# Patient Record
Sex: Male | Born: 1965
Health system: Southern US, Community
[De-identification: ages and names within clinical notes are randomized; demographics above are authoritative.]

## PROBLEM LIST (undated history)

## (undated) DIAGNOSIS — G40909 Epilepsy, unspecified, not intractable, without status epilepticus: Secondary | ICD-10-CM

## (undated) DIAGNOSIS — I341 Nonrheumatic mitral (valve) prolapse: Secondary | ICD-10-CM

## (undated) DIAGNOSIS — Z87442 Personal history of urinary calculi: Secondary | ICD-10-CM

## (undated) DIAGNOSIS — H9190 Unspecified hearing loss, unspecified ear: Secondary | ICD-10-CM

## (undated) DIAGNOSIS — J45909 Unspecified asthma, uncomplicated: Secondary | ICD-10-CM

## (undated) DIAGNOSIS — I10 Essential (primary) hypertension: Secondary | ICD-10-CM

## (undated) DIAGNOSIS — T7840XA Allergy, unspecified, initial encounter: Secondary | ICD-10-CM

## (undated) DIAGNOSIS — K635 Polyp of colon: Secondary | ICD-10-CM

## (undated) DIAGNOSIS — E079 Disorder of thyroid, unspecified: Secondary | ICD-10-CM

## (undated) DIAGNOSIS — F419 Anxiety disorder, unspecified: Secondary | ICD-10-CM

## (undated) DIAGNOSIS — E039 Hypothyroidism, unspecified: Secondary | ICD-10-CM

## (undated) DIAGNOSIS — K219 Gastro-esophageal reflux disease without esophagitis: Secondary | ICD-10-CM

## (undated) DIAGNOSIS — R6250 Unspecified lack of expected normal physiological development in childhood: Secondary | ICD-10-CM

## (undated) DIAGNOSIS — M1990 Unspecified osteoarthritis, unspecified site: Secondary | ICD-10-CM

## (undated) DIAGNOSIS — E7800 Pure hypercholesterolemia, unspecified: Secondary | ICD-10-CM

## (undated) DIAGNOSIS — R011 Cardiac murmur, unspecified: Secondary | ICD-10-CM

## (undated) HISTORY — DX: Nonrheumatic mitral (valve) prolapse: I34.1

## (undated) HISTORY — PX: EXTERNAL EAR SURGERY: SHX627

## (undated) HISTORY — PX: OTHER SURGICAL HISTORY: SHX169

## (undated) HISTORY — DX: Epilepsy, unspecified, not intractable, without status epilepticus: G40.909

## (undated) HISTORY — PX: HEMORRHOID BANDING: SHX5850

## (undated) HISTORY — DX: Anxiety disorder, unspecified: F41.9

## (undated) HISTORY — DX: Disorder of thyroid, unspecified: E07.9

## (undated) HISTORY — PX: ORCHIOPEXY: SHX479

## (undated) HISTORY — DX: Cardiac murmur, unspecified: R01.1

## (undated) HISTORY — DX: Allergy, unspecified, initial encounter: T78.40XA

## (undated) HISTORY — DX: Pure hypercholesterolemia, unspecified: E78.00

## (undated) HISTORY — PX: MANDIBLE FRACTURE SURGERY: SHX706

## (undated) HISTORY — DX: Unspecified hearing loss, unspecified ear: H91.90

---

## 2004-05-09 ENCOUNTER — Ambulatory Visit: Payer: Self-pay | Admitting: Family Medicine

## 2004-06-11 ENCOUNTER — Ambulatory Visit: Payer: Self-pay | Admitting: Family Medicine

## 2004-08-14 ENCOUNTER — Ambulatory Visit: Payer: Self-pay | Admitting: Family Medicine

## 2004-09-17 ENCOUNTER — Ambulatory Visit: Payer: Self-pay | Admitting: Family Medicine

## 2005-01-07 ENCOUNTER — Ambulatory Visit: Payer: Self-pay | Admitting: Family Medicine

## 2005-04-10 ENCOUNTER — Ambulatory Visit: Payer: Self-pay | Admitting: Family Medicine

## 2005-05-24 ENCOUNTER — Ambulatory Visit: Payer: Self-pay | Admitting: Family Medicine

## 2005-10-02 ENCOUNTER — Ambulatory Visit: Payer: Self-pay | Admitting: Family Medicine

## 2005-11-06 ENCOUNTER — Ambulatory Visit: Payer: Self-pay | Admitting: Family Medicine

## 2005-11-18 ENCOUNTER — Ambulatory Visit: Payer: Self-pay | Admitting: Family Medicine

## 2005-12-03 ENCOUNTER — Ambulatory Visit: Payer: Self-pay | Admitting: Gastroenterology

## 2005-12-11 ENCOUNTER — Ambulatory Visit (HOSPITAL_COMMUNITY): Admission: RE | Admit: 2005-12-11 | Discharge: 2005-12-11 | Payer: Self-pay | Admitting: Gastroenterology

## 2005-12-26 ENCOUNTER — Ambulatory Visit: Payer: Self-pay | Admitting: Family Medicine

## 2006-07-03 ENCOUNTER — Ambulatory Visit: Payer: Self-pay | Admitting: Family Medicine

## 2006-07-03 LAB — CONVERTED CEMR LAB: TSH: 4.58 microintl units/mL (ref 0.35–5.50)

## 2006-08-11 ENCOUNTER — Ambulatory Visit: Payer: Self-pay | Admitting: Family Medicine

## 2006-09-08 ENCOUNTER — Ambulatory Visit: Payer: Self-pay | Admitting: Family Medicine

## 2006-09-08 LAB — CONVERTED CEMR LAB: Phenytoin Lvl: 10 ug/mL (ref 10.0–20.0)

## 2006-10-29 DIAGNOSIS — G40909 Epilepsy, unspecified, not intractable, without status epilepticus: Secondary | ICD-10-CM | POA: Insufficient documentation

## 2006-10-29 DIAGNOSIS — G40109 Localization-related (focal) (partial) symptomatic epilepsy and epileptic syndromes with simple partial seizures, not intractable, without status epilepticus: Secondary | ICD-10-CM | POA: Insufficient documentation

## 2006-12-02 DIAGNOSIS — R1012 Left upper quadrant pain: Secondary | ICD-10-CM

## 2006-12-03 ENCOUNTER — Ambulatory Visit: Payer: Self-pay | Admitting: Family Medicine

## 2006-12-04 ENCOUNTER — Ambulatory Visit: Payer: Self-pay | Admitting: Family Medicine

## 2007-07-09 ENCOUNTER — Ambulatory Visit: Payer: Self-pay | Admitting: Family Medicine

## 2007-07-09 DIAGNOSIS — E039 Hypothyroidism, unspecified: Secondary | ICD-10-CM | POA: Insufficient documentation

## 2007-07-09 LAB — CONVERTED CEMR LAB
ALT: 21 units/L (ref 0–53)
AST: 24 units/L (ref 0–37)
Alkaline Phosphatase: 85 units/L (ref 39–117)
Basophils Absolute: 0 10*3/uL (ref 0.0–0.1)
Basophils Relative: 0.5 % (ref 0.0–1.0)
Bilirubin, Direct: 0.1 mg/dL (ref 0.0–0.3)
Blood in Urine, dipstick: NEGATIVE
CO2: 32 meq/L (ref 19–32)
Calcium: 9 mg/dL (ref 8.4–10.5)
Chloride: 100 meq/L (ref 96–112)
Glucose, Bld: 82 mg/dL (ref 70–99)
Glucose, Urine, Semiquant: NEGATIVE
Hemoglobin: 15 g/dL (ref 13.0–17.0)
Ketones, urine, test strip: NEGATIVE
Lymphocytes Relative: 20.7 % (ref 12.0–46.0)
MCHC: 34.3 g/dL (ref 30.0–36.0)
Monocytes Relative: 6.5 % (ref 3.0–12.0)
Neutro Abs: 4.6 10*3/uL (ref 1.4–7.7)
Neutrophils Relative %: 71.5 % (ref 43.0–77.0)
RBC: 4.82 M/uL (ref 4.22–5.81)
RDW: 12.3 % (ref 11.5–14.6)
Sodium: 140 meq/L (ref 135–145)
Specific Gravity, Urine: 1.01
Total Bilirubin: 0.5 mg/dL (ref 0.3–1.2)
Total Protein: 7 g/dL (ref 6.0–8.3)
pH: 5.5

## 2008-08-18 ENCOUNTER — Ambulatory Visit: Payer: Self-pay | Admitting: Family Medicine

## 2008-08-22 LAB — CONVERTED CEMR LAB
BUN: 9 mg/dL (ref 6–23)
Basophils Absolute: 0 10*3/uL (ref 0.0–0.1)
Basophils Relative: 0.6 % (ref 0.0–3.0)
Bilirubin, Direct: 0 mg/dL (ref 0.0–0.3)
CO2: 31 meq/L (ref 19–32)
Calcium: 8.4 mg/dL (ref 8.4–10.5)
Chloride: 103 meq/L (ref 96–112)
Creatinine, Ser: 0.9 mg/dL (ref 0.4–1.5)
Eosinophils Absolute: 0 10*3/uL (ref 0.0–0.7)
Glucose, Bld: 76 mg/dL (ref 70–99)
Lymphocytes Relative: 22.5 % (ref 12.0–46.0)
MCHC: 35.3 g/dL (ref 30.0–36.0)
MCV: 88.9 fL (ref 78.0–100.0)
Monocytes Absolute: 0.4 10*3/uL (ref 0.1–1.0)
Neutrophils Relative %: 69.4 % (ref 43.0–77.0)
Phenytoin Lvl: 14.3 ug/mL (ref 10.0–20.0)
Platelets: 166 10*3/uL (ref 150.0–400.0)
RBC: 4.76 M/uL (ref 4.22–5.81)
RDW: 12.3 % (ref 11.5–14.6)
Total Protein: 7 g/dL (ref 6.0–8.3)

## 2009-03-06 ENCOUNTER — Ambulatory Visit: Payer: Self-pay | Admitting: Family Medicine

## 2009-07-14 ENCOUNTER — Ambulatory Visit: Payer: Self-pay | Admitting: Family Medicine

## 2009-07-17 LAB — CONVERTED CEMR LAB
ALT: 22 units/L (ref 0–53)
AST: 25 units/L (ref 0–37)
Albumin: 4.2 g/dL (ref 3.5–5.2)
Alkaline Phosphatase: 75 units/L (ref 39–117)
Basophils Relative: 0.7 % (ref 0.0–3.0)
CO2: 34 meq/L — ABNORMAL HIGH (ref 19–32)
Calcium: 9.5 mg/dL (ref 8.4–10.5)
GFR calc non Af Amer: 94.68 mL/min (ref >60)
HCT: 46.4 % (ref 39.0–52.0)
Hemoglobin: 15.9 g/dL (ref 13.0–17.0)
Lymphocytes Relative: 18.6 % (ref 12.0–46.0)
MCHC: 34.2 g/dL (ref 30.0–36.0)
Monocytes Relative: 7 % (ref 3.0–12.0)
Neutro Abs: 4.6 10*3/uL (ref 1.4–7.7)
Potassium: 4.9 meq/L (ref 3.5–5.1)
RBC: 5.08 M/uL (ref 4.22–5.81)
Sodium: 145 meq/L (ref 135–145)
Total Protein: 7.2 g/dL (ref 6.0–8.3)

## 2009-10-17 ENCOUNTER — Ambulatory Visit: Payer: Self-pay | Admitting: Family Medicine

## 2009-10-23 ENCOUNTER — Ambulatory Visit: Payer: Self-pay | Admitting: Family Medicine

## 2009-10-23 DIAGNOSIS — R5383 Other fatigue: Secondary | ICD-10-CM

## 2009-10-23 DIAGNOSIS — R519 Headache, unspecified: Secondary | ICD-10-CM | POA: Insufficient documentation

## 2009-10-23 DIAGNOSIS — R51 Headache: Secondary | ICD-10-CM

## 2009-10-23 DIAGNOSIS — R5381 Other malaise: Secondary | ICD-10-CM

## 2009-10-23 LAB — CONVERTED CEMR LAB
AST: 61 units/L — ABNORMAL HIGH (ref 0–37)
BUN: 11 mg/dL (ref 6–23)
Basophils Absolute: 0 10*3/uL (ref 0.0–0.1)
Calcium: 9 mg/dL (ref 8.4–10.5)
Creatinine, Ser: 0.9 mg/dL (ref 0.4–1.5)
GFR calc non Af Amer: 103.6 mL/min (ref >60)
Glucose, Bld: 68 mg/dL — ABNORMAL LOW (ref 70–99)
HCT: 43.9 % (ref 39.0–52.0)
Lymphocytes Relative: 13.3 % (ref 12.0–46.0)
Lymphs Abs: 1.4 10*3/uL (ref 0.7–4.0)
Monocytes Relative: 6.6 % (ref 3.0–12.0)
Neutrophils Relative %: 79.6 % — ABNORMAL HIGH (ref 43.0–77.0)
Platelets: 246 10*3/uL (ref 150.0–400.0)
RDW: 13 % (ref 11.5–14.6)
TSH: 7.01 microintl units/mL — ABNORMAL HIGH (ref 0.35–5.50)
Total Bilirubin: 0.3 mg/dL (ref 0.3–1.2)

## 2009-10-24 ENCOUNTER — Encounter: Payer: Self-pay | Admitting: Family Medicine

## 2009-10-31 ENCOUNTER — Ambulatory Visit: Payer: Self-pay | Admitting: Family Medicine

## 2009-11-27 ENCOUNTER — Telehealth: Payer: Self-pay | Admitting: Family Medicine

## 2009-11-27 ENCOUNTER — Ambulatory Visit: Payer: Self-pay | Admitting: Family Medicine
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
## 2010-04-05 NOTE — Assessment & Plan Note (Signed)
Summary: FLU SHOT/CJR  Nurse Visit  Flu Vaccine Consent Questions     Do you have a history of severe allergic reactions to this vaccine? no    Any prior history of allergic reactions to egg and/or gelatin? no    Do you have a sensitivity to the preservative Thimersol? no    Do you have a past history of Guillan-Barre Syndrome? no    Do you currently have an acute febrile illness? no    Have you ever had a severe reaction to latex? no    Vaccine information given and explained to patient? yes    Are you currently pregnant? no    Lot Number:[id number]   Exp Date:09/01/2010   Site Given  Left Deltoid IM   Allergies: No Known Drug Allergies  Orders Added: 1)  Flu Vaccine 30yrs + [90658] 2)  Administration Flu vaccine - MCR [G0008]
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
## 2010-04-05 NOTE — Miscellaneous (Signed)
Summary: rx update  Clinical Lists Changes  Medications: Removed medication of SYNTHROID 50 MCG  TABS (LEVOTHYROXINE SODIUM) Take 1 tablet by mouth once a day Added new medication of SYNTHROID 75 MCG TABS (LEVOTHYROXINE SODIUM) take one tab by mouth once daily

## 2010-04-05 NOTE — Progress Notes (Signed)
Summary: synthroid refill  Phone Note Refill Request   Refills Requested: Medication #1:  SYNTHROID 75 MCG TABS take one tab by mouth once daily. Initial call taken by: Kern Reap CMA Duncan Dull),  November 27, 2009 4:45 PM    Prescriptions: SYNTHROID 75 MCG TABS (LEVOTHYROXINE SODIUM) take one tab by mouth once daily  #90 x 3   Entered by:   Kern Reap CMA (AAMA)   Authorized by:   Roderick Pee MD   Signed by:   Kern Reap CMA (AAMA) on 11/27/2009   Method used:   Electronically to        [facility]  [address] [facility]* (retail)       [address]       [place], [place]  [address]       Ph: [phone] or [phone]       Fax: [phone]   RxID:   [id number]

## 2010-07-20 NOTE — Letter (Signed)
December 03, 2005    [facility]  [address]  [place], [place]  [address]   RE:  Damon, [name]  MRN:  [id number]  /  DOB:  09/16/1965   Dear Mr. Wenzl:   It is my pleasure to have treated you recently as a new patient in my  office. I appreciate your confidence and the opportunity to participate in  your care.   Since I do have a busy inpatient endoscopy schedule and office schedule, my  office hours vary weekly. I am, however, available for emergency calls every  day through my office. If I cannot promptly meet an urgent office  appointment, another one of our gastroenterologists will be able to assist  you.   My well-trained staff are prepared to help you at all times. For emergencies  after office hours, a physician from our gastroenterology section is always  available through my 24-hour answering service.   While you are under my care, I encourage discussion of your questions and  concerns, and I will be happy to return your calls as soon as I am  available.   Once again, I welcome you as a new patient and I look forward to a happy and  healthy relationship.   Sincerely,     Barbette Hair. Arlyce Dice, MD,FACG   RDK/MedQ  DD:  12/03/2005 DT:  12/04/2005 Job #:  [id number]
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
## 2010-08-29 ENCOUNTER — Encounter: Payer: Self-pay | Admitting: Family Medicine

## 2010-08-30 ENCOUNTER — Encounter: Payer: Self-pay | Admitting: Family Medicine

## 2010-08-30 ENCOUNTER — Ambulatory Visit (INDEPENDENT_AMBULATORY_CARE_PROVIDER_SITE_OTHER): Payer: Medicare Other | Admitting: Family Medicine

## 2010-08-30 DIAGNOSIS — E039 Hypothyroidism, unspecified: Secondary | ICD-10-CM

## 2010-08-30 DIAGNOSIS — R569 Unspecified convulsions: Secondary | ICD-10-CM

## 2010-08-30 LAB — CBC WITH DIFFERENTIAL/PLATELET
Basophils Relative: 0.5 % (ref 0.0–3.0)
Eosinophils Relative: 0.8 % (ref 0.0–5.0)
HCT: 44.6 % (ref 39.0–52.0)
Lymphs Abs: 1.3 10*3/uL (ref 0.7–4.0)
MCV: 90.4 fl (ref 78.0–100.0)
Monocytes Absolute: 0.4 10*3/uL (ref 0.1–1.0)
Neutro Abs: 4.4 10*3/uL (ref 1.4–7.7)
Platelets: 188 10*3/uL (ref 150.0–400.0)
RBC: 4.93 Mil/uL (ref 4.22–5.81)
WBC: 6.1 10*3/uL (ref 4.5–10.5)

## 2010-08-30 LAB — HEPATIC FUNCTION PANEL
ALT: 17 U/L (ref 0–53)
Albumin: 4.3 g/dL (ref 3.5–5.2)
Total Protein: 7 g/dL (ref 6.0–8.3)

## 2010-08-30 LAB — POCT URINALYSIS DIPSTICK
Bilirubin, UA: NEGATIVE
Blood, UA: NEGATIVE
Glucose, UA: NEGATIVE
Nitrite, UA: NEGATIVE
Spec Grav, UA: 1.02

## 2010-08-30 LAB — BASIC METABOLIC PANEL
BUN: 9 mg/dL (ref 6–23)
Chloride: 101 mEq/L (ref 96–112)
Potassium: 3.9 mEq/L (ref 3.5–5.1)

## 2010-08-30 LAB — LIPID PANEL
Cholesterol: 200 mg/dL (ref 0–200)
Triglycerides: 112 mg/dL (ref 0.0–149.0)

## 2010-08-30 LAB — TSH: TSH: 2.83 u[IU]/mL (ref 0.35–5.50)

## 2010-08-30 MED ORDER — LEVOTHYROXINE SODIUM 75 MCG PO TABS: 75.0000 ug | ORAL_TABLET | Freq: Every day | ORAL | Status: DC

## 2010-08-30 MED ORDER — PHENYTOIN SODIUM EXTENDED 100 MG PO CAPS: 100.0000 mg | ORAL_CAPSULE | Freq: Two times a day (BID) | ORAL | Status: DC

## 2010-08-30 NOTE — Patient Instructions (Signed)
Continue current medications return in one year, sooner if any problem
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
## 2010-08-31 LAB — PHENYTOIN LEVEL, TOTAL: Phenytoin Lvl: 19.4 ug/mL (ref 10.0–20.0)

## 2010-09-06 NOTE — Progress Notes (Signed)
lmom for patient

## 2010-11-06 ENCOUNTER — Telehealth: Payer: Self-pay | Admitting: Family Medicine

## 2010-11-06 MED ORDER — PHENYTOIN SODIUM EXTENDED 100 MG PO CAPS: ORAL_CAPSULE | ORAL | Status: DC

## 2010-11-06 NOTE — Telephone Encounter (Signed)
Patient takes Dilantin 100 mg 2 tabs bid.  New rx sent.

## 2010-11-06 NOTE — Telephone Encounter (Signed)
Pt Mother contacted regarding her sons medication of Dilantin. Normally his prescription is for 1 pill 2 times daily last time prescription was fill it was for 1 pill 1 time daily.. Please contact

## 2011-08-27 ENCOUNTER — Other Ambulatory Visit: Payer: Self-pay | Admitting: Family Medicine

## 2011-11-11 ENCOUNTER — Ambulatory Visit (INDEPENDENT_AMBULATORY_CARE_PROVIDER_SITE_OTHER): Payer: Medicare Other | Admitting: Family Medicine

## 2011-11-11 ENCOUNTER — Encounter: Payer: Self-pay | Admitting: Family Medicine

## 2011-11-11 VITALS — BP 110/80 | Temp 98.7°F | Wt 140.0 lb

## 2011-11-11 DIAGNOSIS — R569 Unspecified convulsions: Secondary | ICD-10-CM

## 2011-11-11 DIAGNOSIS — Z23 Encounter for immunization: Secondary | ICD-10-CM

## 2011-11-11 DIAGNOSIS — Z79899 Other long term (current) drug therapy: Secondary | ICD-10-CM

## 2011-11-11 DIAGNOSIS — E039 Hypothyroidism, unspecified: Secondary | ICD-10-CM

## 2011-11-11 LAB — TSH: TSH: 1.18 u[IU]/mL (ref 0.35–5.50)

## 2011-11-11 LAB — POCT URINALYSIS DIPSTICK
Bilirubin, UA: NEGATIVE
Glucose, UA: NEGATIVE
Ketones, UA: NEGATIVE
Leukocytes, UA: NEGATIVE
Nitrite, UA: NEGATIVE
pH, UA: 5.5

## 2011-11-11 LAB — BASIC METABOLIC PANEL
BUN: 11 mg/dL (ref 6–23)
Chloride: 100 mEq/L (ref 96–112)
GFR: 97.36 mL/min (ref >60.00)
Potassium: 4 mEq/L (ref 3.5–5.1)
Sodium: 137 mEq/L (ref 135–145)

## 2011-11-11 LAB — CBC WITH DIFFERENTIAL/PLATELET
Eosinophils Relative: 1.2 % (ref 0.0–5.0)
HCT: 44.3 % (ref 39.0–52.0)
Hemoglobin: 14.7 g/dL (ref 13.0–17.0)
Lymphocytes Relative: 18.9 % (ref 12.0–46.0)
Lymphs Abs: 1.3 10*3/uL (ref 0.7–4.0)
Monocytes Relative: 6.8 % (ref 3.0–12.0)
Platelets: 189 10*3/uL (ref 150.0–400.0)
WBC: 6.9 10*3/uL (ref 4.5–10.5)

## 2011-11-11 MED ORDER — LEVOTHYROXINE SODIUM 75 MCG PO TABS: 75.0000 ug | ORAL_TABLET | Freq: Every day | ORAL | Status: DC

## 2011-11-11 MED ORDER — PHENYTOIN SODIUM EXTENDED 100 MG PO CAPS: ORAL_CAPSULE | ORAL | Status: DC

## 2011-11-11 NOTE — Patient Instructions (Signed)
Continue your current medications  We will call you within 2 weeks with your lab reports
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
## 2011-11-12 LAB — PHENYTOIN LEVEL, TOTAL: Phenytoin Lvl: 15.3 ug/mL (ref 10.0–20.0)

## 2012-03-12 ENCOUNTER — Ambulatory Visit (INDEPENDENT_AMBULATORY_CARE_PROVIDER_SITE_OTHER): Payer: Medicare Other | Admitting: Family Medicine

## 2012-03-12 ENCOUNTER — Encounter: Payer: Self-pay | Admitting: Family Medicine

## 2012-03-12 VITALS — BP 120/80 | Temp 97.5°F | Wt 139.0 lb

## 2012-03-12 DIAGNOSIS — J069 Acute upper respiratory infection, unspecified: Secondary | ICD-10-CM | POA: Insufficient documentation

## 2012-03-12 MED ORDER — HYDROCODONE-HOMATROPINE 5-1.5 MG/5ML PO SYRP: ORAL_SOLUTION | ORAL | Status: DC

## 2012-03-12 NOTE — Progress Notes (Signed)
  Subjective:    Patient ID: Damon [name], male    DOB: 1966/01/22, 47 y.o.   MRN: [id number]  HPI  Mcarthur is a 47 year old male who comes in today accompanied by his mother for evaluation of a cough for 5 days  She's concerned he may have pneumonia. He has no fever chills. He slept well last night. She also has similar symptoms. He is a nonsmoker  Review of Systems    general and pulmonary review of systems otherwise negative Objective:   Physical Exam Well-developed well-nourished male in no acute distress HEENT negative neck was supple no adenopathy lungs are clear       Assessment & Plan:

## 2012-11-24 ENCOUNTER — Ambulatory Visit (INDEPENDENT_AMBULATORY_CARE_PROVIDER_SITE_OTHER): Payer: Medicare Other

## 2012-11-24 DIAGNOSIS — Z23 Encounter for immunization: Secondary | ICD-10-CM

## 2013-01-04 ENCOUNTER — Encounter: Payer: Self-pay | Admitting: Family Medicine

## 2013-01-04 ENCOUNTER — Ambulatory Visit (INDEPENDENT_AMBULATORY_CARE_PROVIDER_SITE_OTHER): Payer: Medicare Other | Admitting: Family Medicine

## 2013-01-04 VITALS — BP 120/80 | Temp 97.4°F | Wt 144.0 lb

## 2013-01-04 DIAGNOSIS — B351 Tinea unguium: Secondary | ICD-10-CM | POA: Insufficient documentation

## 2013-01-04 DIAGNOSIS — E039 Hypothyroidism, unspecified: Secondary | ICD-10-CM

## 2013-01-04 DIAGNOSIS — R569 Unspecified convulsions: Secondary | ICD-10-CM

## 2013-01-04 LAB — BASIC METABOLIC PANEL
BUN: 12 mg/dL (ref 6–23)
Chloride: 103 mEq/L (ref 96–112)
GFR: 72.81 mL/min (ref >60.00)
Glucose, Bld: 66 mg/dL — ABNORMAL LOW (ref 70–99)
Potassium: 4 mEq/L (ref 3.5–5.1)
Sodium: 140 mEq/L (ref 135–145)

## 2013-01-04 LAB — POCT URINALYSIS DIPSTICK
Bilirubin, UA: NEGATIVE
Blood, UA: NEGATIVE
Glucose, UA: NEGATIVE
Ketones, UA: NEGATIVE
Nitrite, UA: NEGATIVE
pH, UA: 5.5

## 2013-01-04 LAB — CBC WITH DIFFERENTIAL/PLATELET
Basophils Absolute: 0 10*3/uL (ref 0.0–0.1)
Basophils Relative: 0.5 % (ref 0.0–3.0)
Eosinophils Relative: 1.6 % (ref 0.0–5.0)
HCT: 43.7 % (ref 39.0–52.0)
Hemoglobin: 14.7 g/dL (ref 13.0–17.0)
Lymphocytes Relative: 21 % (ref 12.0–46.0)
Lymphs Abs: 1.5 10*3/uL (ref 0.7–4.0)
Monocytes Relative: 6 % (ref 3.0–12.0)
Neutro Abs: 5 10*3/uL (ref 1.4–7.7)
RBC: 4.92 Mil/uL (ref 4.22–5.81)
WBC: 7 10*3/uL (ref 4.5–10.5)

## 2013-01-04 MED ORDER — LEVOTHYROXINE SODIUM 75 MCG PO TABS: 75.0000 ug | ORAL_TABLET | Freq: Every day | ORAL | Status: DC

## 2013-01-04 MED ORDER — PHENYTOIN SODIUM EXTENDED 100 MG PO CAPS: ORAL_CAPSULE | ORAL | Status: DC
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
## 2013-01-04 NOTE — Patient Instructions (Signed)
Soak and file his toenails weekly  We will call you and get his lab work back  Continue his current medication for now

## 2013-01-05 LAB — PHENYTOIN LEVEL, TOTAL: Phenytoin Lvl: 14.4 ug/mL (ref 10.0–20.0)

## 2013-10-12 ENCOUNTER — Encounter: Payer: Self-pay | Admitting: Family Medicine

## 2013-10-12 ENCOUNTER — Ambulatory Visit (INDEPENDENT_AMBULATORY_CARE_PROVIDER_SITE_OTHER): Payer: Medicare Other | Admitting: Family Medicine

## 2013-10-12 DIAGNOSIS — E039 Hypothyroidism, unspecified: Secondary | ICD-10-CM

## 2013-10-12 DIAGNOSIS — R569 Unspecified convulsions: Secondary | ICD-10-CM

## 2013-10-12 LAB — BASIC METABOLIC PANEL
BUN: 10 mg/dL (ref 6–23)
CALCIUM: 8.9 mg/dL (ref 8.4–10.5)
CO2: 27 mEq/L (ref 19–32)
Chloride: 103 mEq/L (ref 96–112)
Creatinine, Ser: 1 mg/dL (ref 0.4–1.5)
GFR: 84.42 mL/min (ref >60.00)
Glucose, Bld: 61 mg/dL — ABNORMAL LOW (ref 70–99)
Potassium: 4.7 mEq/L (ref 3.5–5.1)
SODIUM: 139 meq/L (ref 135–145)

## 2013-10-12 LAB — CBC WITH DIFFERENTIAL/PLATELET
BASOS ABS: 0 10*3/uL (ref 0.0–0.1)
Basophils Relative: 0.4 % (ref 0.0–3.0)
EOS ABS: 0 10*3/uL (ref 0.0–0.7)
EOS PCT: 0.4 % (ref 0.0–5.0)
HCT: 44 % (ref 39.0–52.0)
Hemoglobin: 14.7 g/dL (ref 13.0–17.0)
Lymphocytes Relative: 18.2 % (ref 12.0–46.0)
Lymphs Abs: 1.1 10*3/uL (ref 0.7–4.0)
MCHC: 33.5 g/dL (ref 30.0–36.0)
MCV: 89.4 fl (ref 78.0–100.0)
MONO ABS: 0.4 10*3/uL (ref 0.1–1.0)
Monocytes Relative: 6.4 % (ref 3.0–12.0)
NEUTROS PCT: 74.6 % (ref 43.0–77.0)
Neutro Abs: 4.4 10*3/uL (ref 1.4–7.7)
PLATELETS: 196 10*3/uL (ref 150.0–400.0)
RBC: 4.92 Mil/uL (ref 4.22–5.81)
RDW: 13.1 % (ref 11.5–15.5)
WBC: 5.9 10*3/uL (ref 4.0–10.5)

## 2013-10-12 LAB — TSH: TSH: 1.39 u[IU]/mL (ref 0.35–4.50)

## 2013-10-12 MED ORDER — PHENYTOIN SODIUM EXTENDED 100 MG PO CAPS: ORAL_CAPSULE | ORAL | Status: DC

## 2013-10-12 MED ORDER — LEVOTHYROXINE SODIUM 75 MCG PO TABS: 75.0000 ug | ORAL_TABLET | Freq: Every day | ORAL | Status: DC
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
## 2013-10-12 NOTE — Patient Instructions (Signed)
Continue current medications  Labs today  Motrin 400 mg twice a day with food elevation ice for soreness in his left wrist  Okay to give him sugar when necessary

## 2013-10-13 LAB — PHENYTOIN LEVEL, TOTAL: PHENYTOIN LVL: 16.8 ug/mL (ref 10.0–20.0)

## 2013-11-12 ENCOUNTER — Ambulatory Visit (INDEPENDENT_AMBULATORY_CARE_PROVIDER_SITE_OTHER): Payer: Medicare Other

## 2013-11-12 DIAGNOSIS — Z23 Encounter for immunization: Secondary | ICD-10-CM

## 2014-03-23 ENCOUNTER — Encounter: Payer: Self-pay | Admitting: *Deleted

## 2014-08-01 ENCOUNTER — Ambulatory Visit (INDEPENDENT_AMBULATORY_CARE_PROVIDER_SITE_OTHER): Payer: Medicare Other | Admitting: Physician Assistant

## 2014-08-01 ENCOUNTER — Ambulatory Visit (INDEPENDENT_AMBULATORY_CARE_PROVIDER_SITE_OTHER): Payer: Medicare Other

## 2014-08-01 VITALS — BP 115/82 | HR 88 | Temp 98.2°F | Resp 17 | Ht 67.0 in | Wt 141.4 lb

## 2014-08-01 DIAGNOSIS — R05 Cough: Secondary | ICD-10-CM | POA: Diagnosis not present

## 2014-08-01 DIAGNOSIS — R058 Other specified cough: Secondary | ICD-10-CM

## 2014-08-01 LAB — POCT CBC
Granulocyte percent: 77.7 %G (ref 37–80)
HCT, POC: 46.3 % (ref 43.5–53.7)
Hemoglobin: 15.4 g/dL (ref 14.1–18.1)
Lymph, poc: 1.6 (ref 0.6–3.4)
MCH, POC: 29.5 pg (ref 27–31.2)
MCHC: 33.3 g/dL (ref 31.8–35.4)
MCV: 88.6 fL (ref 80–97)
MID (cbc): 1 — AB (ref 0–0.9)
MPV: 8.7 fL (ref 0–99.8)
POC GRANULOCYTE: 9.1 — AB (ref 2–6.9)
POC LYMPH %: 13.7 % (ref 10–50)
POC MID %: 8.6 % (ref 0–12)
Platelet Count, POC: 187 10*3/uL (ref 142–424)
RBC: 5.22 M/uL (ref 4.69–6.13)
RDW, POC: 13.2 %
WBC: 11.7 10*3/uL — AB (ref 4.6–10.2)

## 2014-08-01 MED ORDER — GUAIFENESIN ER 1200 MG PO TB12: 1.0000 | ORAL_TABLET | Freq: Two times a day (BID) | ORAL | Status: DC | PRN

## 2014-08-01 MED ORDER — AZITHROMYCIN 250 MG PO TABS: ORAL_TABLET | ORAL | Status: DC

## 2014-08-01 MED ORDER — BENZONATATE 100 MG PO CAPS: 100.0000 mg | ORAL_CAPSULE | Freq: Three times a day (TID) | ORAL | Status: DC | PRN

## 2014-08-01 NOTE — Patient Instructions (Signed)
Please drink plenty of water 64oz (4 regular sized water bottles). You can use delsym (non-drowsy) over the counter to help with cough as well.    Pneumonia Pneumonia is an infection of the lungs.  CAUSES Pneumonia may be caused by bacteria or a virus. Usually, these infections are caused by breathing infectious particles into the lungs (respiratory tract). SIGNS AND SYMPTOMS   Cough.  Fever.  Chest pain.  Increased rate of breathing.  Wheezing.  Mucus production. DIAGNOSIS  If you have the common symptoms of pneumonia, your health care provider will typically confirm the diagnosis with a chest X-ray. The X-ray will show an abnormality in the lung (pulmonary infiltrate) if you have pneumonia. Other tests of your blood, urine, or sputum may be done to find the specific cause of your pneumonia. Your health care provider may also do tests (blood gases or pulse oximetry) to see how well your lungs are working. TREATMENT  Some forms of pneumonia may be spread to other people when you cough or sneeze. You may be asked to wear a mask before and during your exam. Pneumonia that is caused by bacteria is treated with antibiotic medicine. Pneumonia that is caused by the influenza virus may be treated with an antiviral medicine. Most other viral infections must run their course. These infections will not respond to antibiotics.  HOME CARE INSTRUCTIONS   Cough suppressants may be used if you are losing too much rest. However, coughing protects you by clearing your lungs. You should avoid using cough suppressants if you can.  Your health care provider may have prescribed medicine if he or she thinks your pneumonia is caused by bacteria or influenza. Finish your medicine even if you start to feel better.  Your health care provider may also prescribe an expectorant. This loosens the mucus to be coughed up.  Take medicines only as directed by your health care provider.  Do not smoke. Smoking is a  common cause of bronchitis and can contribute to pneumonia. If you are a smoker and continue to smoke, your cough may last several weeks after your pneumonia has cleared.  A cold steam vaporizer or humidifier in your room or home may help loosen mucus.  Coughing is often worse at night. Sleeping in a semi-upright position in a recliner or using a couple pillows under your head will help with this.  Get rest as you feel it is needed. Your body will usually let you know when you need to rest. PREVENTION A pneumococcal shot (vaccine) is available to prevent a common bacterial cause of pneumonia. This is usually suggested for:  People over 70 years old.  Patients on chemotherapy.  People with chronic lung problems, such as bronchitis or emphysema.  People with immune system problems. If you are over 65 or have a high risk condition, you may receive the pneumococcal vaccine if you have not received it before. In some countries, a routine influenza vaccine is also recommended. This vaccine can help prevent some cases of pneumonia.You may be offered the influenza vaccine as part of your care. If you smoke, it is time to quit. You may receive instructions on how to stop smoking. Your health care provider can provide medicines and counseling to help you quit. SEEK MEDICAL CARE IF: You have a fever. SEEK IMMEDIATE MEDICAL CARE IF:   Your illness becomes worse. This is especially true if you are elderly or weakened from any other disease.  You cannot control your cough with suppressants  and are losing sleep.  You begin coughing up blood.  You develop pain which is getting worse or is uncontrolled with medicines.  Any of the symptoms which initially brought you in for treatment are getting worse rather than better.  You develop shortness of breath or chest pain. MAKE SURE YOU:   Understand these instructions.  Will watch your condition.  Will get help right away if you are not doing well  or get worse. Document Released: 02/18/2005 Document Revised: 07/05/2013 Document Reviewed: 05/10/2010 [facility] Patient Information 2015 [place], [place]. This information is not intended to replace advice given to you by your health care provider. Make sure you discuss any questions you have with your health care provider.
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
## 2014-09-07 ENCOUNTER — Ambulatory Visit (INDEPENDENT_AMBULATORY_CARE_PROVIDER_SITE_OTHER): Payer: Medicare Other | Admitting: Physician Assistant

## 2014-09-07 VITALS — BP 122/68 | HR 77 | Temp 97.9°F | Resp 18 | Ht 67.5 in | Wt 144.0 lb

## 2014-09-07 DIAGNOSIS — B880 Other acariasis: Secondary | ICD-10-CM | POA: Diagnosis not present

## 2014-09-07 DIAGNOSIS — L03116 Cellulitis of left lower limb: Secondary | ICD-10-CM | POA: Diagnosis not present

## 2014-09-07 MED ORDER — TRIAMCINOLONE ACETONIDE 0.1 % EX CREA: 1.0000 | TOPICAL_CREAM | Freq: Two times a day (BID) | CUTANEOUS | Status: DC

## 2014-09-07 MED ORDER — DOXYCYCLINE HYCLATE 100 MG PO CAPS: 100.0000 mg | ORAL_CAPSULE | Freq: Two times a day (BID) | ORAL | Status: DC

## 2014-09-07 NOTE — Patient Instructions (Signed)
You likely have chigger bites with one becoming infected.  Please take the antibiotic twice daily for 10 days.  Please apply the steroid topical twice daily for itching. You can continue to apply calamine lotion for itching.  You can take benadryl at night as needed for itching.  Be sure to wear long pants outdoors to try to prevent the bites.

## 2014-09-07 NOTE — Progress Notes (Signed)
   Subjective:    Patient ID: Damon [name]., male    DOB: [birth year], 49 y.o.   MRN: [id number]  Chief Complaint  Patient presents with  . Insect Bite    possible tick bite. several bites one both leg. one on leg inside leg is the worse one    Patient Active Problem List   Diagnosis Date Noted  . Onychomycosis of toenail 01/04/2013  . FATIGUE 10/23/2009  . HEADACHE 10/23/2009  . UNSPECIFIED HYPOTHYROIDISM 07/09/2007  . SEIZURE DISORDER 10/29/2006   Medications, allergies, past medical history, surgical history, family history, social history and problem list reviewed and updated.  HPI  33 yom presents with concern for several bites to legs. [place] a lot. Has had several bites to legs past 10 days. Has not seen any ticks, spiders. Denies seeing anything in bed. He only has marks on his legs none on arms. Has been working outdoors with shorts. His mother who is with him states she has been in the yard as well and has similar marks.   Areas are itchy. Denies fevers, chills.   Review of Systems See HPI.     Objective:   Physical Exam  Constitutional: He appears well-developed and well-nourished.  Non-toxic appearance. He does not have a sickly appearance. He does not appear ill. No distress.  BP 122/68 mmHg  Pulse 77  Temp(Src) 97.9 F (36.6 C) (Oral)  Resp 18  Ht 5' 7.5" (1.715 m)  Wt 144 lb (65.318 kg)  BMI 22.21 kg/m2  SpO2 97%   Skin:  Numerous small macules/papules on lower extremities. All are itchy. No involvement of upper extremities. No lesions between toes. No specific tracking marks. One lesion on left medial calf is approx 7-8 mm circular lesion with surrounding erythema. Proteinaceous material       Assessment & Plan:   Chigger bites - Plan: triamcinolone cream (KENALOG) 0.1 %  Cellulitis of leg, left - Plan: doxycycline (VIBRAMYCIN) 100 MG capsule --suspect chigger bites as cause --lesion on left  medial calf appears infected --> doxy which will also cover for possible tick exposures --triamcinolone bid, calamine, benadryl qhs for itching --long pants outdoors in future  [name], PA-C Physician Assistant-Certified [facility]  09/07/2014 4:37 PM

## 2014-11-07 ENCOUNTER — Other Ambulatory Visit: Payer: Self-pay | Admitting: Family Medicine

## 2014-12-07 ENCOUNTER — Encounter: Payer: Self-pay | Admitting: Internal Medicine

## 2014-12-07 ENCOUNTER — Ambulatory Visit (INDEPENDENT_AMBULATORY_CARE_PROVIDER_SITE_OTHER): Payer: Medicare Other | Admitting: Family Medicine

## 2014-12-07 VITALS — BP 130/90 | Temp 97.7°F | Ht 68.0 in | Wt 141.0 lb

## 2014-12-07 DIAGNOSIS — Z8371 Family history of colonic polyps: Secondary | ICD-10-CM | POA: Insufficient documentation

## 2014-12-07 DIAGNOSIS — Z23 Encounter for immunization: Secondary | ICD-10-CM

## 2014-12-07 DIAGNOSIS — G40909 Epilepsy, unspecified, not intractable, without status epilepticus: Secondary | ICD-10-CM

## 2014-12-07 DIAGNOSIS — E039 Hypothyroidism, unspecified: Secondary | ICD-10-CM | POA: Diagnosis not present

## 2014-12-07 DIAGNOSIS — Z0000 Encounter for general adult medical examination without abnormal findings: Secondary | ICD-10-CM | POA: Insufficient documentation

## 2014-12-07 DIAGNOSIS — Z83719 Family history of colon polyps, unspecified: Secondary | ICD-10-CM | POA: Insufficient documentation

## 2014-12-07 LAB — CBC WITH DIFFERENTIAL/PLATELET
BASOS PCT: 0.6 % (ref 0.0–3.0)
Basophils Absolute: 0 10*3/uL (ref 0.0–0.1)
EOS PCT: 1.1 % (ref 0.0–5.0)
Eosinophils Absolute: 0.1 10*3/uL (ref 0.0–0.7)
HEMATOCRIT: 46.8 % (ref 39.0–52.0)
Hemoglobin: 15.4 g/dL (ref 13.0–17.0)
LYMPHS PCT: 22 % (ref 12.0–46.0)
Lymphs Abs: 1.1 10*3/uL (ref 0.7–4.0)
MCHC: 32.8 g/dL (ref 30.0–36.0)
MCV: 89.9 fl (ref 78.0–100.0)
Monocytes Absolute: 0.3 10*3/uL (ref 0.1–1.0)
Monocytes Relative: 6.6 % (ref 3.0–12.0)
NEUTROS ABS: 3.5 10*3/uL (ref 1.4–7.7)
Neutrophils Relative %: 69.7 % (ref 43.0–77.0)
PLATELETS: 173 10*3/uL (ref 150.0–400.0)
RBC: 5.2 Mil/uL (ref 4.22–5.81)
RDW: 13.9 % (ref 11.5–15.5)
WBC: 5 10*3/uL (ref 4.0–10.5)

## 2014-12-07 LAB — BASIC METABOLIC PANEL
BUN: 8 mg/dL (ref 6–23)
CHLORIDE: 101 meq/L (ref 96–112)
CO2: 32 mEq/L (ref 19–32)
CREATININE: 0.86 mg/dL (ref 0.40–1.50)
Calcium: 9 mg/dL (ref 8.4–10.5)
GFR: 100 mL/min (ref >60.00)
Glucose, Bld: 101 mg/dL — ABNORMAL HIGH (ref 70–99)
Potassium: 3.9 mEq/L (ref 3.5–5.1)
Sodium: 143 mEq/L (ref 135–145)

## 2014-12-07 LAB — POCT URINALYSIS DIPSTICK
BILIRUBIN UA: NEGATIVE
GLUCOSE UA: NEGATIVE
KETONES UA: NEGATIVE
Leukocytes, UA: NEGATIVE
Nitrite, UA: NEGATIVE
Protein, UA: NEGATIVE
RBC UA: NEGATIVE
SPEC GRAV UA: 1.025
Urobilinogen, UA: 0.2
pH, UA: 5.5

## 2014-12-07 LAB — TSH: TSH: 2.93 u[IU]/mL (ref 0.35–4.50)

## 2014-12-07 MED ORDER — LEVOTHYROXINE SODIUM 75 MCG PO TABS: 75.0000 ug | ORAL_TABLET | Freq: Every day | ORAL | Status: DC

## 2014-12-07 MED ORDER — PHENYTOIN SODIUM EXTENDED 100 MG PO CAPS: ORAL_CAPSULE | ORAL | Status: DC

## 2014-12-07 NOTE — Patient Instructions (Signed)
Labs today I will call you if there is anything abnormal  Continue current medications  Gastroenterology we'll call you with the time and date to go over and discuss the pluses and minuses and different types of screening colonoscopies for your son
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
## 2014-12-07 NOTE — Progress Notes (Signed)
Pre visit review using our clinic review tool, if applicable. No additional management support is needed unless otherwise documented below in the visit note. 

## 2014-12-08 ENCOUNTER — Other Ambulatory Visit: Payer: Self-pay | Admitting: *Deleted

## 2014-12-08 DIAGNOSIS — G40909 Epilepsy, unspecified, not intractable, without status epilepticus: Secondary | ICD-10-CM

## 2014-12-08 MED ORDER — SYNTHROID 75 MCG PO TABS: 75.0000 ug | ORAL_TABLET | Freq: Every day | ORAL | Status: DC

## 2014-12-08 MED ORDER — FLUTICASONE PROPIONATE 50 MCG/ACT NA SUSP: 2.0000 | Freq: Every day | NASAL | Status: DC

## 2014-12-08 MED ORDER — PHENYTOIN SODIUM EXTENDED 100 MG PO CAPS: ORAL_CAPSULE | ORAL | Status: DC

## 2014-12-08 MED ORDER — FLUOXETINE HCL 10 MG PO TABS: 10.0000 mg | ORAL_TABLET | Freq: Every day | ORAL | Status: DC

## 2014-12-11 LAB — PHENYTOIN LEVEL, FREE AND TOTAL
PHENYTOIN BOUND: 13.7 mg/L
Phenytoin, Free: 0.9 mg/L — ABNORMAL LOW (ref 1.0–2.0)
Phenytoin, Total: 14.6 mg/L (ref 10.0–20.0)

## 2015-02-01 ENCOUNTER — Ambulatory Visit (INDEPENDENT_AMBULATORY_CARE_PROVIDER_SITE_OTHER): Payer: Medicare Other | Admitting: Internal Medicine

## 2015-02-01 ENCOUNTER — Encounter: Payer: Self-pay | Admitting: Internal Medicine

## 2015-02-01 VITALS — BP 120/80 | HR 70 | Ht 68.0 in | Wt 144.4 lb

## 2015-02-01 DIAGNOSIS — Z800 Family history of malignant neoplasm of digestive organs: Secondary | ICD-10-CM | POA: Diagnosis not present

## 2015-02-01 DIAGNOSIS — Z1211 Encounter for screening for malignant neoplasm of colon: Secondary | ICD-10-CM
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
## 2015-02-01 NOTE — Patient Instructions (Signed)
You have been scheduled for a colonoscopy. Please follow written instructions given to you at your visit today.  Please pick up your prep supplies at the pharmacy within the next 1-3 days. If you use inhalers (even only as needed), please bring them with you on the day of your procedure.   

## 2015-02-02 ENCOUNTER — Encounter: Payer: Self-pay | Admitting: Internal Medicine

## 2015-02-14 ENCOUNTER — Ambulatory Visit (AMBULATORY_SURGERY_CENTER): Payer: Medicare Other | Admitting: Internal Medicine

## 2015-02-14 ENCOUNTER — Encounter: Payer: Self-pay | Admitting: Internal Medicine

## 2015-02-14 VITALS — BP 115/82 | HR 85 | Temp 97.7°F | Resp 19 | Ht 68.0 in | Wt 144.0 lb

## 2015-02-14 DIAGNOSIS — D122 Benign neoplasm of ascending colon: Secondary | ICD-10-CM

## 2015-02-14 DIAGNOSIS — Z1211 Encounter for screening for malignant neoplasm of colon: Secondary | ICD-10-CM

## 2015-02-14 DIAGNOSIS — D123 Benign neoplasm of transverse colon: Secondary | ICD-10-CM

## 2015-02-14 MED ORDER — SODIUM CHLORIDE 0.9 % IV SOLN: 500.0000 mL | INTRAVENOUS | Status: DC

## 2015-02-14 NOTE — Progress Notes (Signed)
Called to room to assist during endoscopic procedure.  Patient ID and intended procedure confirmed with present staff. Received instructions for my participation in the procedure from the performing physician.  

## 2015-02-14 NOTE — Progress Notes (Signed)
  [facility] Post-op Note  Patient: Damon [name].  Procedure(s) Performed: colonoscopy  Patient Location: [facility] - Recovery Area  Anesthesia Type: Deep Sedation/Propofol  Level of Consciousness: awake, oriented and patient cooperative  Airway and Oxygen Therapy: Patient Spontanous Breathing  Post-op Pain: none  Post-op Assessment:  Post-op Vital signs reviewed, Patient's Cardiovascular Status Stable, Respiratory Function Stable, Patent Airway, No signs of Nausea or vomiting and Pain level controlled  Post-op Vital Signs: Reviewed and stable  Complications: No apparent anesthesia complications  Jaxiel Kines E 12:17 PM

## 2015-02-14 NOTE — Patient Instructions (Signed)
YOU HAD AN ENDOSCOPIC PROCEDURE TODAY AT THE [facility]:   Refer to the procedure report that was given to you for any specific questions about what was found during the examination.  If the procedure report does not answer your questions, please call your gastroenterologist to clarify.  If you requested that your care partner not be given the details of your procedure findings, then the procedure report has been included in a sealed envelope for you to review at your convenience later.  YOU SHOULD EXPECT: Some feelings of bloating in the abdomen. Passage of more gas than usual.  Walking can help get rid of the air that was put into your GI tract during the procedure and reduce the bloating. If you had a lower endoscopy (such as a colonoscopy or flexible sigmoidoscopy) you may notice spotting of blood in your stool or on the toilet paper. If you underwent a bowel prep for your procedure, you may not have a normal bowel movement for a few days.  Please Note:  You might notice some irritation and congestion in your nose or some drainage.  This is from the oxygen used during your procedure.  There is no need for concern and it should clear up in a day or so.  SYMPTOMS TO REPORT IMMEDIATELY:   Following lower endoscopy (colonoscopy or flexible sigmoidoscopy):  Excessive amounts of blood in the stool  Significant tenderness or worsening of abdominal pains  Swelling of the abdomen that is new, acute  Fever of 100F or higher   For urgent or emergent issues, a gastroenterologist can be reached at any hour by calling [phone].   DIET: Your first meal following the procedure should be a small meal and then it is ok to progress to your normal diet. Heavy or fried foods are harder to digest and may make you feel nauseous or bloated.  Likewise, meals heavy in dairy and vegetables can increase bloating.  Drink plenty of fluids but you should avoid alcoholic beverages for 24  hours.  ACTIVITY:  You should plan to take it easy for the rest of today and you should NOT DRIVE or use heavy machinery until tomorrow (because of the sedation medicines used during the test).    FOLLOW UP: Our staff will call the number listed on your records the next business day following your procedure to check on you and address any questions or concerns that you may have regarding the information given to you following your procedure. If we do not reach you, we will leave a message.  However, if you are feeling well and you are not experiencing any problems, there is no need to return our call.  We will assume that you have returned to your regular daily activities without incident.  If any biopsies were taken you will be contacted by phone or by letter within the next 1-3 weeks.  Please call us at [phone] if you have not heard about the biopsies in 3 weeks.    SIGNATURES/CONFIDENTIALITY: You and/or your care partner have signed paperwork which will be entered into your electronic medical record.  These signatures attest to the fact that that the information above on your After Visit Summary has been reviewed and is understood.  Full responsibility of the confidentiality of this discharge information lies with you and/or your care-partner.  Polyp information given.  

## 2015-02-14 NOTE — Op Note (Signed)
[place]  [address] [place], [address]   COLONOSCOPY PROCEDURE REPORT  PATIENT: Damon [name], Damon [name]  MR#: [id number] BIRTHDATE: Jul 14, 1965 , 49  yrs. old GENDER: male ENDOSCOPIST: Eustace Quail, MD REFERRED [id number] Todd, MD PROCEDURE DATE:  02/14/2015 PROCEDURE:   Colonoscopy, screening and Colonoscopy with snare polypectomy x 3 First Screening Colonoscopy - Avg.  risk and is 50 yrs.  old or older Yes.  Prior Negative Screening - Now for repeat screening. N/A  History of Adenoma - Now for follow-up colonoscopy & has been > or = to 3 yrs.  N/A  Polyps removed today? Yes ASA CLASS:   Class III INDICATIONS:Screening for colonic neoplasia and Colorectal Neoplasm Risk Assessment for this procedure is average risk. MEDICATIONS: Propofol 300 mg IV  DESCRIPTION OF PROCEDURE:   After the risks benefits and alternatives of the procedure were thoroughly explained, informed consent was obtained.  The digital rectal exam revealed external hemorrhoids.   The [id number]  endoscope was introduced through the anus and advanced to the cecum, which was identified by both the appendix and ileocecal valve. No adverse events experienced.   The quality of the prep was good.  (Suprep was used) The instrument was then slowly withdrawn as the colon was fully examined. Estimated blood loss is zero unless otherwise noted in this procedure report.  COLON FINDINGS: Two polyps ranging from 4 to 47mm in size were found in the ascending colon.  A polypectomy was performed with a cold snare.  The resection was complete, the polyp tissue was completely retrieved and sent to histology.   A pedunculated polyp measuring 15 mm in size was found in the transverse colon.  A polypectomy was performed using snare cautery.  The resection was complete, the polyp tissue was completely retrieved and sent to histology.   The examination was otherwise normal.  Retroflexion was not  performed due to a narrow rectal vault. Internal hemorrhoids present. The time to cecum = 2.0 Withdrawal time = 17.0   The scope was withdrawn and the procedure completed. COMPLICATIONS: There were no immediate complications.  ENDOSCOPIC IMPRESSION: 1.   Two polyps were found in the ascending colon; polypectomy was performed with a cold snare 2.   A 74mm Pedunculated polyp was found in the transverse colon; polypectomy was performed using snare cautery 3.   The examination was otherwise normal  RECOMMENDATIONS: 1. Repeat Colonoscopy in 3 years.  eSigned:  Eustace Quail, MD 02/14/2015 12:20 PM   cc: The Patient and Dorena Cookey, MD

## 2015-02-14 NOTE — Progress Notes (Signed)
Pt. Does not want to pass air because he is afraid he will have bowel movement, per his mother.  He is deaf and unable to communicate verbally. He has passed small amount of air, but holds abdomen and grimaces.  Refuses to pass air.  His abd. Is distended.

## 2015-02-15 ENCOUNTER — Encounter: Payer: Self-pay | Admitting: Internal Medicine

## 2015-02-15 ENCOUNTER — Telehealth: Payer: Self-pay | Admitting: *Deleted

## 2015-02-15 NOTE — Telephone Encounter (Signed)
Watch closely. If ongoing significant bleeding occurs, may need to be evaluated. Please contact the patient's mother later this afternoon for follow-up (patient cannot communicate due to deafness and history of traumatic brain injury). Thank you

## 2015-02-15 NOTE — Telephone Encounter (Signed)
A user error has taken place.

## 2015-02-15 NOTE — Telephone Encounter (Signed)
  Follow up Call-  Call back number 02/14/2015  Post procedure Call Back phone  # [phone]  Permission to leave phone message Yes     Patient questions:  Do you have a fever, pain , or abdominal swelling? No. Pain Score  0 *  Have you tolerated food without any problems? Yes.    Have you been able to return to your normal activities? Yes.    Do you have any questions about your discharge instructions: Diet   No. Medications  No. Follow up visit  No.  Do you have questions or concerns about your Care? Yes.    Actions: * If pain score is 4 or above: No action needed, pain <4.  Pt.'s mom states that pt. Told her he was up "pooping" four times last night with bleeding.  She has not observed any stools.  She will watch today and let us know if bleeding is excessive.  She is aware Per your advice yesterday, that some bleeding is normal.  He has eating, and complains of no pain.

## 2015-02-15 NOTE — Telephone Encounter (Signed)
Spoke with Pt.'s mother.  States he has not had any bowel movements as yet.  She is going to watch for bleeding.  States he seems to be indicating that his Abdomen is sore.  She states he is not distended, no fever, and is able to eat without any problems.  I advised her to monitor stools and behavior in general And to call if she has any concerns.

## 2015-02-15 NOTE — Telephone Encounter (Signed)
Agree. Thanks

## 2015-02-16 ENCOUNTER — Telehealth: Payer: Self-pay | Admitting: Internal Medicine

## 2015-02-16 ENCOUNTER — Emergency Department (HOSPITAL_COMMUNITY)
Admission: EM | Admit: 2015-02-16 | Discharge: 2015-02-16 | Disposition: A | Discharge status: Home / Self Care | Payer: Medicare Other | Attending: Emergency Medicine | Admitting: Emergency Medicine

## 2015-02-16 ENCOUNTER — Encounter (HOSPITAL_COMMUNITY): Payer: Self-pay

## 2015-02-16 DIAGNOSIS — Z79899 Other long term (current) drug therapy: Secondary | ICD-10-CM | POA: Insufficient documentation

## 2015-02-16 DIAGNOSIS — Z86010 Personal history of colonic polyps: Secondary | ICD-10-CM | POA: Insufficient documentation

## 2015-02-16 DIAGNOSIS — K91840 Postprocedural hemorrhage and hematoma of a digestive system organ or structure following a digestive system procedure: Secondary | ICD-10-CM | POA: Insufficient documentation

## 2015-02-16 DIAGNOSIS — K644 Residual hemorrhoidal skin tags: Secondary | ICD-10-CM

## 2015-02-16 DIAGNOSIS — Z9889 Other specified postprocedural states: Secondary | ICD-10-CM | POA: Diagnosis not present

## 2015-02-16 DIAGNOSIS — IMO0002 Reserved for concepts with insufficient information to code with codable children: Secondary | ICD-10-CM

## 2015-02-16 DIAGNOSIS — Z87828 Personal history of other (healed) physical injury and trauma: Secondary | ICD-10-CM | POA: Insufficient documentation

## 2015-02-16 DIAGNOSIS — G40909 Epilepsy, unspecified, not intractable, without status epilepticus: Secondary | ICD-10-CM | POA: Insufficient documentation

## 2015-02-16 DIAGNOSIS — Z8679 Personal history of other diseases of the circulatory system: Secondary | ICD-10-CM | POA: Insufficient documentation

## 2015-02-16 DIAGNOSIS — H9190 Unspecified hearing loss, unspecified ear: Secondary | ICD-10-CM | POA: Diagnosis not present

## 2015-02-16 DIAGNOSIS — E079 Disorder of thyroid, unspecified: Secondary | ICD-10-CM | POA: Diagnosis not present

## 2015-02-16 DIAGNOSIS — R011 Cardiac murmur, unspecified: Secondary | ICD-10-CM | POA: Insufficient documentation

## 2015-02-16 DIAGNOSIS — K625 Hemorrhage of anus and rectum: Secondary | ICD-10-CM | POA: Diagnosis present

## 2015-02-16 HISTORY — DX: Polyp of colon: K63.5

## 2015-02-16 LAB — CBC
HCT: 40.8 % (ref 39.0–52.0)
Hemoglobin: 13.5 g/dL (ref 13.0–17.0)
MCH: 30.1 pg (ref 26.0–34.0)
MCHC: 33.1 g/dL (ref 30.0–36.0)
MCV: 91.1 fL (ref 78.0–100.0)
PLATELETS: 164 10*3/uL (ref 150–400)
RBC: 4.48 MIL/uL (ref 4.22–5.81)
RDW: 12.9 % (ref 11.5–15.5)
WBC: 5.2 10*3/uL (ref 4.0–10.5)

## 2015-02-16 LAB — COMPREHENSIVE METABOLIC PANEL
ALT: 18 U/L (ref 17–63)
AST: 21 U/L (ref 15–41)
Albumin: 3.8 g/dL (ref 3.5–5.0)
Alkaline Phosphatase: 84 U/L (ref 38–126)
Anion gap: 8 (ref 5–15)
BILIRUBIN TOTAL: 0.5 mg/dL (ref 0.3–1.2)
BUN: 12 mg/dL (ref 6–20)
CO2: 28 mmol/L (ref 22–32)
CREATININE: 0.95 mg/dL (ref 0.61–1.24)
Calcium: 8.4 mg/dL — ABNORMAL LOW (ref 8.9–10.3)
Chloride: 102 mmol/L (ref 101–111)
GFR calc Af Amer: >60 mL/min (ref >60)
Glucose, Bld: 94 mg/dL (ref 65–99)
Potassium: 3.9 mmol/L (ref 3.5–5.1)
Sodium: 138 mmol/L (ref 135–145)
TOTAL PROTEIN: 6.6 g/dL (ref 6.5–8.1)

## 2015-02-16 LAB — TYPE AND SCREEN
ABO/RH(D): O NEG
ANTIBODY SCREEN: NEGATIVE

## 2015-02-16 LAB — ABO/RH: ABO/RH(D): O NEG

## 2015-02-16 MED ORDER — HYDROCORTISONE 2.5 % RE CREA: TOPICAL_CREAM | RECTAL | Status: DC
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
## 2015-02-16 NOTE — Telephone Encounter (Signed)
Pts mother states that pt has had multiple stools since colon and there has been blood in the stool with what appeared to be clots. Instructed pts mother to take him to the hospital to be evaluated. She verbalized understanding. Dr. Henrene Pastor notified.

## 2015-02-16 NOTE — ED Notes (Signed)
Pt had colonoscopy on Tuesday.  4 polyps removed.  Pt having rectal bleeding since.  Abdominal pain.  No fever.  No n/v

## 2015-02-16 NOTE — Telephone Encounter (Signed)
Noted  

## 2015-02-16 NOTE — Telephone Encounter (Signed)
Patient mom called back. Best # now is [phone]

## 2015-02-16 NOTE — Discharge Instructions (Signed)
Gastrointestinal Bleeding Gastrointestinal (GI) bleeding means there is bleeding somewhere along the digestive tract, between the mouth and anus. CAUSES  There are many different problems that can cause GI bleeding. Possible causes include:  Esophagitis. This is inflammation, irritation, or swelling of the esophagus.  Hemorrhoids.These are veins that are full of blood (engorged) in the rectum. They cause pain, inflammation, and may bleed.  Anal fissures.These are areas of painful tearing which may bleed. They are often caused by passing hard stool.  Diverticulosis.These are pouches that form on the colon over time, with age, and may bleed significantly.  Diverticulitis.This is inflammation in areas with diverticulosis. It can cause pain, fever, and bloody stools, although bleeding is rare.  Polyps and cancer. Colon cancer often starts out as precancerous polyps.  Gastritis and ulcers.Bleeding from the upper gastrointestinal tract (near the stomach) may travel through the intestines and produce black, sometimes tarry, often bad smelling stools. In certain cases, if the bleeding is fast enough, the stools may not be black, but red. This condition may be life-threatening. SYMPTOMS   Vomiting bright red blood or material that looks like coffee grounds.  Bloody, black, or tarry stools. DIAGNOSIS  Your caregiver may diagnose your condition by taking your history and performing a physical exam. More tests may be needed, including:  X-rays and other imaging tests.  Esophagogastroduodenoscopy (EGD). This test uses a flexible, lighted tube to look at your esophagus, stomach, and small intestine.  Colonoscopy. This test uses a flexible, lighted tube to look at your colon. TREATMENT  Treatment depends on the cause of your bleeding.   For bleeding from the esophagus, stomach, small intestine, or colon, the caregiver doing your EGD or colonoscopy may be able to stop the bleeding as part of  the procedure.  Inflammation or infection of the colon can be treated with medicines.  Many rectal problems can be treated with creams, suppositories, or warm baths.  Surgery is sometimes needed.  Blood transfusions are sometimes needed if you have lost a lot of blood. If bleeding is slow, you may be allowed to go home. If there is a lot of bleeding, you will need to stay in the hospital for observation. HOME CARE INSTRUCTIONS   Take any medicines exactly as prescribed.  Keep your stools soft by eating foods that are high in fiber. These foods include whole grains, legumes, fruits, and vegetables. Prunes (1 to 3 a day) work well for many people.  Drink enough fluids to keep your urine clear or pale yellow. SEEK IMMEDIATE MEDICAL CARE IF:   Your bleeding increases.  You feel lightheaded, weak, or you faint.  You have severe cramps in your back or abdomen.  You pass large blood clots in your stool.  Your problems are getting worse. MAKE SURE YOU:   Understand these instructions.  Will watch your condition.  Will get help right away if you are not doing well or get worse.   This information is not intended to replace advice given to you by your health care provider. Make sure you discuss any questions you have with your health care provider.   Document Released: 02/16/2000 Document Revised: 02/05/2012 Document Reviewed: 08/08/2014 Elsevier Interactive Patient Education 2016 [employer].  Hemorrhoids Hemorrhoids are swollen veins around the rectum or anus. There are two types of hemorrhoids:   Internal hemorrhoids. These occur in the veins just inside the rectum. They may poke through to the outside and become irritated and painful.  External hemorrhoids. These occur  in the veins outside the anus and can be felt as a painful swelling or hard lump near the anus. CAUSES  Pregnancy.   Obesity.   Constipation or diarrhea.   Straining to have a bowel movement.    Sitting for long periods on the toilet.  Heavy lifting or other activity that caused you to strain.  Anal intercourse. SYMPTOMS   Pain.   Anal itching or irritation.   Rectal bleeding.   Fecal leakage.   Anal swelling.   One or more lumps around the anus.  DIAGNOSIS  Your caregiver may be able to diagnose hemorrhoids by visual examination. Other examinations or tests that may be performed include:   Examination of the rectal area with a gloved hand (digital rectal exam).   Examination of anal canal using a small tube (scope).   A blood test if you have lost a significant amount of blood.  A test to look inside the colon (sigmoidoscopy or colonoscopy). TREATMENT Most hemorrhoids can be treated at home. However, if symptoms do not seem to be getting better or if you have a lot of rectal bleeding, your caregiver may perform a procedure to help make the hemorrhoids get smaller or remove them completely. Possible treatments include:   Placing a rubber band at the base of the hemorrhoid to cut off the circulation (rubber band ligation).   Injecting a chemical to shrink the hemorrhoid (sclerotherapy).   Using a tool to burn the hemorrhoid (infrared light therapy).   Surgically removing the hemorrhoid (hemorrhoidectomy).   Stapling the hemorrhoid to block blood flow to the tissue (hemorrhoid stapling).  HOME CARE INSTRUCTIONS   Eat foods with fiber, such as whole grains, beans, nuts, fruits, and vegetables. Ask your doctor about taking products with added fiber in them (fibersupplements).  Increase fluid intake. Drink enough water and fluids to keep your urine clear or pale yellow.   Exercise regularly.   Go to the bathroom when you have the urge to have a bowel movement. Do not wait.   Avoid straining to have bowel movements.   Keep the anal area dry and clean. Use wet toilet paper or moist towelettes after a bowel movement.   Medicated creams  and suppositories may be used or applied as directed.   Only take over-the-counter or prescription medicines as directed by your caregiver.   Take warm sitz baths for 15-20 minutes, 3-4 times a day to ease pain and discomfort.   Place ice packs on the hemorrhoids if they are tender and swollen. Using ice packs between sitz baths may be helpful.   Put ice in a plastic bag.   Place a towel between your skin and the bag.   Leave the ice on for 15-20 minutes, 3-4 times a day.   Do not use a donut-shaped pillow or sit on the toilet for long periods. This increases blood pooling and pain.  SEEK MEDICAL CARE IF:  You have increasing pain and swelling that is not controlled by treatment or medicine.  You have uncontrolled bleeding.  You have difficulty or you are unable to have a bowel movement.  You have pain or inflammation outside the area of the hemorrhoids. MAKE SURE YOU:  Understand these instructions.  Will watch your condition.  Will get help right away if you are not doing well or get worse.   This information is not intended to replace advice given to you by your health care provider. Make sure you discuss any questions you  have with your health care provider.   Document Released: 02/16/2000 Document Revised: 02/05/2012 Document Reviewed: 12/24/2011 Elsevier Interactive Patient Education [employer].

## 2015-02-21 ENCOUNTER — Encounter: Payer: Self-pay | Admitting: Internal Medicine

## 2015-05-31 ENCOUNTER — Ambulatory Visit (INDEPENDENT_AMBULATORY_CARE_PROVIDER_SITE_OTHER): Payer: Medicare Other

## 2015-05-31 ENCOUNTER — Ambulatory Visit (INDEPENDENT_AMBULATORY_CARE_PROVIDER_SITE_OTHER): Payer: Medicare Other | Admitting: Physician Assistant

## 2015-05-31 VITALS — BP 126/68 | HR 82 | Temp 98.0°F | Resp 16 | Ht 68.0 in | Wt 144.0 lb

## 2015-05-31 DIAGNOSIS — D72820 Lymphocytosis (symptomatic): Secondary | ICD-10-CM | POA: Diagnosis not present

## 2015-05-31 DIAGNOSIS — R05 Cough: Secondary | ICD-10-CM

## 2015-05-31 DIAGNOSIS — J40 Bronchitis, not specified as acute or chronic: Secondary | ICD-10-CM

## 2015-05-31 DIAGNOSIS — R053 Chronic cough: Secondary | ICD-10-CM

## 2015-05-31 LAB — POCT CBC
Granulocyte percent: 91.5 %G — AB (ref 37–80)
HCT, POC: 42.1 % — AB (ref 43.5–53.7)
HEMOGLOBIN: 14.8 g/dL (ref 14.1–18.1)
Lymph, poc: 0.9 (ref 0.6–3.4)
MCH: 31.1 pg (ref 27–31.2)
MCHC: 35.3 g/dL (ref 31.8–35.4)
MCV: 88 fL (ref 80–97)
MID (CBC): 0.3 (ref 0–0.9)
MPV: 8.5 fL (ref 0–99.8)
PLATELET COUNT, POC: 147 10*3/uL (ref 142–424)
POC GRANULOCYTE: 5.5 (ref 2–6.9)
POC LYMPH PERCENT: 13.7 %L (ref 10–50)
POC MID %: 4.8 %M (ref 0–12)
RBC: 4.78 M/uL (ref 4.69–6.13)
RDW, POC: 13.2 %
WBC: 6.7 10*3/uL (ref 4.6–10.2)

## 2015-05-31 MED ORDER — HYDROCODONE-HOMATROPINE 5-1.5 MG/5ML PO SYRP: 5.0000 mL | ORAL_SOLUTION | Freq: Three times a day (TID) | ORAL | Status: DC | PRN

## 2015-05-31 MED ORDER — RANITIDINE HCL 150 MG PO TABS: 150.0000 mg | ORAL_TABLET | Freq: Every day | ORAL | Status: DC

## 2015-05-31 NOTE — Patient Instructions (Signed)
     IF you received an x-ray today, you will receive an invoice from [facility]. Please contact [facility] at [phone] with questions or concerns regarding your invoice.   IF you received labwork today, you will receive an invoice from [facility]. Please contact [facility] at [phone] with questions or concerns regarding your invoice.   Our billing staff will not be able to assist you with questions regarding bills from these companies.  You will be contacted with the lab results as soon as they are available. The fastest way to get your results is to activate your My Chart account. Instructions are located on the last page of this paperwork. If you have not heard from us regarding the results in 2 weeks, please contact this office.      
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
## 2015-06-01 LAB — PATHOLOGIST SMEAR REVIEW

## 2015-06-06 ENCOUNTER — Ambulatory Visit: Payer: Medicare Other | Admitting: Family Medicine

## 2015-07-03 ENCOUNTER — Other Ambulatory Visit: Payer: Self-pay | Admitting: Family Medicine

## 2015-07-03 NOTE — Telephone Encounter (Signed)
Pt following up on prior auth  SYNTHROID 75 MCG tablet phenytoin (DILANTIN) 100 MG ER capsule   [facility]   [place]: [phone]

## 2015-07-04 ENCOUNTER — Ambulatory Visit (INDEPENDENT_AMBULATORY_CARE_PROVIDER_SITE_OTHER): Payer: Medicare Other | Admitting: Family Medicine

## 2015-07-04 ENCOUNTER — Encounter: Payer: Self-pay | Admitting: Family Medicine

## 2015-07-04 VITALS — BP 140/78 | HR 74 | Temp 97.9°F | Resp 14 | Ht 68.0 in | Wt 148.1 lb

## 2015-07-04 DIAGNOSIS — R059 Cough, unspecified: Secondary | ICD-10-CM

## 2015-07-04 DIAGNOSIS — E049 Nontoxic goiter, unspecified: Secondary | ICD-10-CM

## 2015-07-04 DIAGNOSIS — R05 Cough: Secondary | ICD-10-CM

## 2015-07-04 DIAGNOSIS — E038 Other specified hypothyroidism: Secondary | ICD-10-CM

## 2015-07-04 DIAGNOSIS — F419 Anxiety disorder, unspecified: Secondary | ICD-10-CM

## 2015-07-04 DIAGNOSIS — K219 Gastro-esophageal reflux disease without esophagitis: Secondary | ICD-10-CM

## 2015-07-04 DIAGNOSIS — G40909 Epilepsy, unspecified, not intractable, without status epilepticus: Secondary | ICD-10-CM

## 2015-07-04 MED ORDER — OMEPRAZOLE 40 MG PO CPDR: 40.0000 mg | DELAYED_RELEASE_CAPSULE | Freq: Every day | ORAL | Status: DC

## 2015-07-04 MED ORDER — PHENYTOIN SODIUM EXTENDED 100 MG PO CAPS: ORAL_CAPSULE | ORAL | Status: DC

## 2015-07-04 MED ORDER — SYNTHROID 75 MCG PO TABS: 75.0000 ug | ORAL_TABLET | Freq: Every day | ORAL | Status: DC

## 2015-07-04 MED ORDER — FLUOXETINE HCL 10 MG PO TABS: 10.0000 mg | ORAL_TABLET | Freq: Every day | ORAL | Status: DC
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
## 2015-07-04 NOTE — Patient Instructions (Signed)
A few things to remember from today's visit:   1. Seizure disorder ([facility])  - phenytoin (DILANTIN) 100 MG ER capsule; 2 tabs twice daily  Dispense: 400 capsule; Refill: 3 - Phenytoin level, free and total  2. Other specified hypothyroidism  - SYNTHROID 75 MCG tablet; Take 1 tablet (75 mcg total) by mouth daily.  Dispense: 90 tablet; Refill: 2 - FLUoxetine (PROZAC) 10 MG tablet; Take 1 tablet (10 mg total) by mouth daily.  Dispense: 90 tablet; Refill: 1 - TSH; Future - T4, free  3. Anxiety disorder, unspecified   4. Cough   5. Gastroesophageal reflux disease without esophagitis   Small portions, decrease soda intake, avoid coffee and chocolate. After dinner wait at least 3 hours before going to bed.    - omeprazole (PRILOSEC) 40 MG capsule; Take 1 capsule (40 mg total) by mouth daily before breakfast.  Dispense: 30 capsule; Refill: 3  6. Enlarged thyroid gland  - US Soft Tissue Head/Neck; Future      If you sign-up for My chart, you can communicate easier with [place] in case you have any question or concern.

## 2015-07-05 ENCOUNTER — Other Ambulatory Visit (INDEPENDENT_AMBULATORY_CARE_PROVIDER_SITE_OTHER): Payer: Medicare Other

## 2015-07-05 ENCOUNTER — Other Ambulatory Visit: Payer: Self-pay | Admitting: Family Medicine

## 2015-07-05 DIAGNOSIS — E038 Other specified hypothyroidism: Secondary | ICD-10-CM

## 2015-07-05 LAB — TSH: TSH: 2.94 u[IU]/mL (ref 0.35–4.50)

## 2015-07-05 LAB — VITAMIN D 25 HYDROXY (VIT D DEFICIENCY, FRACTURES): VITD: 16.12 ng/mL — AB (ref 30.00–100.00)

## 2015-07-05 NOTE — Telephone Encounter (Signed)
Spoke to patient and I did Cover my meds. Waiting on a response.

## 2015-07-05 NOTE — Telephone Encounter (Signed)
Pharmacy need to know if the pt need to stay on the brand name Rx for phenytoin (DILANTIN) 100 mg if so need a PA from [employer].

## 2015-07-06 ENCOUNTER — Telehealth: Payer: Self-pay

## 2015-07-06 LAB — PTH, INTACT AND CALCIUM
CALCIUM: 8.8 mg/dL (ref 8.4–10.5)
PTH: 135 pg/mL — ABNORMAL HIGH (ref 14–64)

## 2015-07-06 LAB — CALCIUM, IONIZED: Calcium, Ion: 1.2 mmol/L (ref 1.12–1.32)

## 2015-07-06 NOTE — Telephone Encounter (Signed)
PA for Synthroid is done in Cover My Meds.  Waiting response with 72 hours.

## 2015-07-06 NOTE — Telephone Encounter (Signed)
Per nicole at [employer] dilantin has been approved from  07-05-15 to 07-04-16 any questions call  [phone]. Elmyra Ricks will fax approval letter

## 2015-07-06 NOTE — Telephone Encounter (Signed)
Pt need new Rx for Synthroid 75 mg.

## 2015-07-06 NOTE — Telephone Encounter (Signed)
Synthroid has been approved.

## 2015-07-06 NOTE — Telephone Encounter (Signed)
-----   Message from Betty G Martinique, MD sent at 07/06/2015  1:27 PM EDT ----- Active calcium and thyroid function normal. Vit D low, so I am recommending Ergocalciferol 50,000 U weekly x 16 weeks then q 2 months OR OTC Vit D 5000 U daily. This can be re-checked in 4 months (25 [place] vit D). Some labs still pending. Thanks!

## 2015-07-06 NOTE — Telephone Encounter (Signed)
Patient notified Dilantin is approved and Synthroid is waiting approval.

## 2015-07-06 NOTE — Telephone Encounter (Signed)
Unable to reach patient. Phone line is busy.

## 2015-07-06 NOTE — Telephone Encounter (Signed)
Left message for patient to call office regarding lab results and recommendations. 

## 2015-07-07 ENCOUNTER — Ambulatory Visit
Admission: RE | Admit: 2015-07-07 | Discharge: 2015-07-07 | Disposition: A | Discharge status: Home / Self Care | Payer: Medicare Other | Source: Ambulatory Visit | Attending: Family Medicine | Admitting: Family Medicine

## 2015-07-07 DIAGNOSIS — E049 Nontoxic goiter, unspecified: Secondary | ICD-10-CM

## 2015-07-11 ENCOUNTER — Other Ambulatory Visit: Payer: Medicare Other

## 2015-07-11 ENCOUNTER — Other Ambulatory Visit: Payer: Self-pay | Admitting: Family Medicine

## 2015-07-11 DIAGNOSIS — E041 Nontoxic single thyroid nodule: Secondary | ICD-10-CM

## 2015-07-11 LAB — T4, FREE: Free T4: 0.71 ng/dL (ref 0.60–1.60)

## 2015-07-13 ENCOUNTER — Telehealth: Payer: Self-pay

## 2015-07-13 NOTE — Telephone Encounter (Signed)
Left message for patients mother [name] to call back to inform her how to proceed with thyroid bx.

## 2015-07-13 NOTE — Telephone Encounter (Signed)
-----   Message from Betty G Martinique, MD sent at 07/11/2015  1:15 PM EDT ----- Regarding: Thyroid nodule Bx Referral placed. Usually Bx is done with local anesthesia and it is an office procedure. Thanks   ----- Message -----    From: Precious Gilding, RN    Sent: 07/11/2015   9:46 AM      To: Betty G Martinique, MD  [name] plans to bring patient in today to get Phenytoin and T4 labs drawn. [name] also chose to get biopsy done on Thyroid. Mother is inquiring if any anaesthetics will be used.

## 2015-07-14 LAB — PHENYTOIN LEVEL, FREE AND TOTAL
PHENYTOIN BOUND: 16.7 mg/L
PHENYTOIN FREE: 1.2 mg/L (ref 1.0–2.0)
Phenytoin, Total: 17.9 mg/L (ref 10.0–20.0)

## 2015-07-14 NOTE — Telephone Encounter (Signed)
Left another message for patients mother to call office regarding referral.

## 2015-07-19 ENCOUNTER — Encounter: Payer: Self-pay | Admitting: Family Medicine

## 2015-07-19 ENCOUNTER — Ambulatory Visit (INDEPENDENT_AMBULATORY_CARE_PROVIDER_SITE_OTHER): Payer: Medicare Other | Admitting: Family Medicine

## 2015-07-19 ENCOUNTER — Other Ambulatory Visit: Payer: Self-pay | Admitting: Family Medicine

## 2015-07-19 VITALS — BP 118/74 | HR 69 | Temp 97.8°F | Resp 12 | Ht 68.0 in | Wt 147.2 lb

## 2015-07-19 DIAGNOSIS — E559 Vitamin D deficiency, unspecified: Secondary | ICD-10-CM | POA: Insufficient documentation

## 2015-07-19 DIAGNOSIS — E041 Nontoxic single thyroid nodule: Secondary | ICD-10-CM | POA: Diagnosis not present

## 2015-07-19 DIAGNOSIS — E162 Hypoglycemia, unspecified: Secondary | ICD-10-CM

## 2015-07-19 DIAGNOSIS — G40909 Epilepsy, unspecified, not intractable, without status epilepticus: Secondary | ICD-10-CM

## 2015-07-19 LAB — POCT GLYCOSYLATED HEMOGLOBIN (HGB A1C): Hemoglobin A1C: 4.8

## 2015-07-19 NOTE — Progress Notes (Signed)
Pre visit review using our clinic review tool, if applicable. No additional management support is needed unless otherwise documented below in the visit note. 

## 2015-07-19 NOTE — Patient Instructions (Addendum)
A few things to remember from today's visit:   1. Vitamin D deficiency Lab in 3-4 months.  2. Thyroid nodule Keep surgery appt.  3. Seizure disorder [facility]) Neuro referral placed. - Ambulatory referral to Neurology  4. Hypoglycemia No Diabetes. Small meals every 3 hours and avoid concentrated sugars (candy,sweets).  - POC HgB A1c      If you sign-up for My chart, you can communicate easier with [place] in case you have any question or concern.
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
## 2015-07-24 ENCOUNTER — Other Ambulatory Visit: Payer: Self-pay | Admitting: General Surgery

## 2015-07-24 DIAGNOSIS — E041 Nontoxic single thyroid nodule: Secondary | ICD-10-CM

## 2015-08-01 ENCOUNTER — Other Ambulatory Visit (HOSPITAL_COMMUNITY)
Admission: RE | Admit: 2015-08-01 | Discharge: 2015-08-01 | Disposition: A | Discharge status: Home / Self Care | Payer: Medicare Other | Source: Ambulatory Visit | Attending: Interventional Radiology | Admitting: Interventional Radiology

## 2015-08-01 ENCOUNTER — Ambulatory Visit
Admission: RE | Admit: 2015-08-01 | Discharge: 2015-08-01 | Disposition: A | Discharge status: Home / Self Care | Payer: Medicare Other | Source: Ambulatory Visit | Attending: General Surgery | Admitting: General Surgery

## 2015-08-01 DIAGNOSIS — E041 Nontoxic single thyroid nodule: Secondary | ICD-10-CM

## 2015-09-07 ENCOUNTER — Ambulatory Visit: Payer: Medicare Other | Admitting: Neurology

## 2015-10-02 ENCOUNTER — Encounter: Payer: Self-pay | Admitting: Neurology

## 2015-10-02 ENCOUNTER — Ambulatory Visit (INDEPENDENT_AMBULATORY_CARE_PROVIDER_SITE_OTHER): Payer: Medicare Other | Admitting: Neurology

## 2015-10-02 VITALS — BP 122/84 | HR 70 | Ht 67.0 in | Wt 151.0 lb

## 2015-10-02 DIAGNOSIS — R42 Dizziness and giddiness: Secondary | ICD-10-CM

## 2015-10-02 DIAGNOSIS — G40209 Localization-related (focal) (partial) symptomatic epilepsy and epileptic syndromes with complex partial seizures, not intractable, without status epilepticus: Secondary | ICD-10-CM
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
## 2015-10-02 NOTE — Patient Instructions (Signed)
I don't think the dizzy episodes are seizures.  However, if they become frequent, we can try to capture a spell while he is wearing an EEG.

## 2015-11-24 ENCOUNTER — Ambulatory Visit (INDEPENDENT_AMBULATORY_CARE_PROVIDER_SITE_OTHER): Payer: Medicare Other

## 2015-11-24 DIAGNOSIS — Z23 Encounter for immunization: Secondary | ICD-10-CM

## 2016-01-15 ENCOUNTER — Ambulatory Visit (INDEPENDENT_AMBULATORY_CARE_PROVIDER_SITE_OTHER): Payer: Medicare Other | Admitting: Physician Assistant

## 2016-01-15 ENCOUNTER — Ambulatory Visit (INDEPENDENT_AMBULATORY_CARE_PROVIDER_SITE_OTHER): Payer: Medicare Other

## 2016-01-15 VITALS — BP 100/68 | HR 88 | Temp 97.5°F | Resp 16 | Ht 67.0 in | Wt 151.0 lb

## 2016-01-15 DIAGNOSIS — J209 Acute bronchitis, unspecified: Secondary | ICD-10-CM | POA: Diagnosis not present

## 2016-01-15 DIAGNOSIS — R059 Cough, unspecified: Secondary | ICD-10-CM

## 2016-01-15 DIAGNOSIS — R05 Cough: Secondary | ICD-10-CM

## 2016-01-15 DIAGNOSIS — R5383 Other fatigue: Secondary | ICD-10-CM

## 2016-01-15 LAB — POCT CBC
Granulocyte percent: 78.3 % (ref 37–80)
HCT, POC: 42.3 % — AB (ref 43.5–53.7)
Hemoglobin: 15.5 g/dL (ref 14.1–18.1)
Lymph, poc: 1.2 (ref 0.6–3.4)
MCH, POC: 31 pg (ref 27–31.2)
MCHC: 36.6 g/dL — AB (ref 31.8–35.4)
MCV: 84.5 fL (ref 80–97)
MID (cbc): 0.5 (ref 0–0.9)
MPV: 8.3 fL (ref 0–99.8)
POC Granulocyte: 6.1 (ref 2–6.9)
POC LYMPH PERCENT: 15.9 %L (ref 10–50)
POC MID %: 5.8 % (ref 0–12)
Platelet Count, POC: 135 10*3/uL — AB (ref 142–424)
RBC: 5.01 M/uL (ref 4.69–6.13)
RDW, POC: 12.5 %
WBC: 7.8 10*3/uL (ref 4.6–10.2)

## 2016-01-15 LAB — POCT INFLUENZA A/B
Influenza A, POC: NEGATIVE
Influenza B, POC: NEGATIVE

## 2016-01-15 MED ORDER — MUCINEX DM MAXIMUM STRENGTH 60-1200 MG PO TB12: 1.0000 | ORAL_TABLET | Freq: Two times a day (BID) | ORAL | 1 refills | Status: DC

## 2016-01-15 MED ORDER — AZITHROMYCIN 250 MG PO TABS: ORAL_TABLET | ORAL | 0 refills | Status: DC

## 2016-01-15 MED ORDER — BENZONATATE 100 MG PO CAPS: 100.0000 mg | ORAL_CAPSULE | Freq: Three times a day (TID) | ORAL | 0 refills | Status: DC | PRN

## 2016-01-15 NOTE — Patient Instructions (Addendum)
Please take the ENTIRE COURSE of antibiotics, even if you feel better. Hot steamy showers to help clear out mucus.   Thank you for coming in today. I hope you feel we met your needs.  Feel free to call [facility] if you have any questions or further requests.  Please consider signing up for MyChart if you do not already have it, as this is a great way to communicate with me.  Best,  Whitney McVey, PA-C   Acute Bronchitis Bronchitis is inflammation of the airways that extend from the windpipe into the lungs (bronchi). The inflammation often causes mucus to develop. This leads to a cough, which is the most common symptom of bronchitis.  In acute bronchitis, the condition usually develops suddenly and goes away over time, usually in a couple weeks. Smoking, allergies, and asthma can make bronchitis worse. Repeated episodes of bronchitis may cause further lung problems.  CAUSES Acute bronchitis is most often caused by the same virus that causes a cold. The virus can spread from person to person (contagious) through coughing, sneezing, and touching contaminated objects. SIGNS AND SYMPTOMS   Cough.   Fever.   Coughing up mucus.   Body aches.   Chest congestion.   Chills.   Shortness of breath.   Sore throat.  DIAGNOSIS  Acute bronchitis is usually diagnosed through a physical exam. Your health care provider will also ask you questions about your medical history. Tests, such as chest X-rays, are sometimes done to rule out other conditions.  TREATMENT  Acute bronchitis usually goes away in a couple weeks. Oftentimes, no medical treatment is necessary. Medicines are sometimes given for relief of fever or cough. Antibiotic medicines are usually not needed but may be prescribed in certain situations. In some cases, an inhaler may be recommended to help reduce shortness of breath and control the cough. A cool mist vaporizer may also be used to help thin bronchial secretions and make it  easier to clear the chest.  HOME CARE INSTRUCTIONS  Get plenty of rest.   Drink enough fluids to keep your urine clear or pale yellow (unless you have a medical condition that requires fluid restriction). Increasing fluids may help thin your respiratory secretions (sputum) and reduce chest congestion, and it will prevent dehydration.   Take medicines only as directed by your health care provider.  If you were prescribed an antibiotic medicine, finish it all even if you start to feel better.  Avoid smoking and secondhand smoke. Exposure to cigarette smoke or irritating chemicals will make bronchitis worse. If you are a smoker, consider using nicotine gum or skin patches to help control withdrawal symptoms. Quitting smoking will help your lungs heal faster.   Reduce the chances of another bout of acute bronchitis by washing your hands frequently, avoiding people with cold symptoms, and trying not to touch your hands to your mouth, nose, or eyes.   Keep all follow-up visits as directed by your health care provider.  SEEK MEDICAL CARE IF: Your symptoms do not improve after 1 week of treatment.  SEEK IMMEDIATE MEDICAL CARE IF:  You develop an increased fever or chills.   You have chest pain.   You have severe shortness of breath.  You have bloody sputum.   You develop dehydration.  You faint or repeatedly feel like you are going to pass out.  You develop repeated vomiting.  You develop a severe headache. MAKE SURE YOU:   Understand these instructions.  Will watch your condition.  Will get help right away if you are not doing well or get worse.   This information is not intended to replace advice given to you by your health care provider. Make sure you discuss any questions you have with your health care provider.   Document Released: 03/28/2004 Document Revised: 03/11/2014 Document Reviewed: 08/11/2012 Elsevier Interactive Patient Education 2016 [employer].    IF you received an x-ray today, you will receive an invoice from [facility]. Please contact [facility] at [phone] with questions or concerns regarding your invoice.   IF you received labwork today, you will receive an invoice from [employer]. Please contact [facility] at [phone] with questions or concerns regarding your invoice.   Our billing staff will not be able to assist you with questions regarding bills from these companies.  You will be contacted with the lab results as soon as they are available. The fastest way to get your results is to activate your My Chart account. Instructions are located on the last page of this paperwork. If you have not heard from [place] regarding the results in 2 weeks, please contact this office.

## 2016-01-15 NOTE — Progress Notes (Signed)
[place]  MRN: [id number] DOB: 07-25-1965  PCP: Betty Martinique, MD  Subjective:  Pt is a 50 year old male, history of hypothyroidism, seizure disorder, who presents to clinic for cough x six days. Patient is deaf, mother is here with him interpreting.  Complains of a productive cough - white/yellow sputum, fatigue, chills, and weakness.. Cough is present during the day and night.  His symptoms are not improving, however they are not worsening either.  Has tried Robitussin and Vix-sav, not helping much.  Denies fever, chest pain/pressure, SOB, difficulty sleeping.  Sick contacts: lives with parents, both mother and father are sick.   Review of Systems  Constitutional: Positive for chills and fatigue. Negative for diaphoresis.  HENT: Positive for rhinorrhea.   Respiratory: Positive for cough. Negative for chest tightness, shortness of breath and wheezing.   Cardiovascular: Negative for chest pain, palpitations and leg swelling.  Gastrointestinal: Negative for diarrhea, nausea and vomiting.  Musculoskeletal: Negative for neck pain.  Neurological: Positive for weakness and headaches. Negative for dizziness, syncope and light-headedness.  Psychiatric/Behavioral: Negative for sleep disturbance. The patient is not nervous/anxious.     Patient Active Problem List   Diagnosis Date Noted  . Vitamin D deficiency 07/19/2015  . Hypocalcemia 07/04/2015  . Routine general medical examination at a health care facility 12/07/2014  . Family history of colonic polyps 12/07/2014  . Onychomycosis of toenail 01/04/2013  . FATIGUE 10/23/2009  . HEADACHE 10/23/2009  . Hypothyroidism 07/09/2007  . Seizure disorder ([place]) 10/29/2006    Current Outpatient Prescriptions on File Prior to Visit  Medication Sig Dispense Refill  . Cetirizine HCl (ZYRTEC ALLERGY PO) Take by mouth.    . Cholecalciferol ([employer] VIT D 5000 HIGH-POTENCY PO) Take by mouth.    Marland Kitchen FLUoxetine (PROZAC) 10 MG tablet Take 1  tablet (10 mg total) by mouth daily. 90 tablet 1  . hydrocortisone (ANUSOL-HC) 2.5 % rectal cream Apply rectally 2 times daily 28.35 g 1  . omeprazole (PRILOSEC) 40 MG capsule Take 1 capsule (40 mg total) by mouth daily before breakfast. 30 capsule 3  . phenytoin (DILANTIN) 100 MG ER capsule 2 tabs twice daily 400 capsule 3  . ranitidine (ZANTAC) 150 MG tablet Take 1 tablet (150 mg total) by mouth at bedtime. 90 tablet 3  . SYNTHROID 75 MCG tablet Take 1 tablet (75 mcg total) by mouth daily. 90 tablet 2   No current facility-administered medications on file prior to visit.     No Known Allergies   Objective:  BP 100/68 (BP Location: Right Arm, Patient Position: Sitting, Cuff Size: Small)   Pulse 88   Temp 97.5 F (36.4 C) (Oral)   Resp 16   Ht 5\' 7"  (1.702 m)   Wt 151 lb (68.5 kg)   SpO2 95%   BMI 23.65 kg/m   Physical Exam  Constitutional: He is oriented to person, place, and time and well-developed, well-nourished, and in no distress. No distress.  HENT:  Nose: Mucosal edema and rhinorrhea present.  Mouth/Throat: Posterior oropharyngeal edema present.  Cardiovascular: Normal rate, regular rhythm and normal heart sounds.   Pulmonary/Chest: Effort normal. He has wheezes in the left upper field. He has rhonchi in the left middle field. He has rales in the left middle field.  Neurological: He is alert and oriented to person, place, and time. GCS score is 15.  Skin: Skin is warm and dry.  Psychiatric: Mood, memory, affect and judgment normal.  Vitals reviewed.  Results for  orders placed or performed in visit on 01/15/16  POCT CBC  Result Value Ref Range   WBC 7.8 4.6 - 10.2 K/uL   Lymph, poc 1.2 0.6 - 3.4   POC LYMPH PERCENT 15.9 10 - 50 %L   MID (cbc) 0.5 0 - 0.9   POC MID % 5.8 0 - 12 %M   POC Granulocyte 6.1 2 - 6.9   Granulocyte percent 78.3 37 - 80 %G   RBC 5.01 4.69 - 6.13 M/uL   Hemoglobin 15.5 14.1 - 18.1 g/dL   HCT, POC 42.3 (A) 43.5 - 53.7 %   MCV 84.5 80 - 97  fL   MCH, POC 31.0 27 - 31.2 pg   MCHC 36.6 (A) 31.8 - 35.4 g/dL   RDW, POC 12.5 %   Platelet Count, POC 135 (A) 142 - 424 K/uL   MPV 8.3 0 - 99.8 fL  POCT Influenza A/B  Result Value Ref Range   Influenza A, POC Negative Negative   Influenza B, POC Negative Negative   Dg Chest 2 View  Result Date: 01/15/2016 CLINICAL DATA:  Two weeks of cough, fatigue, and chills. EXAM: CHEST  2 VIEW COMPARISON:  PA and lateral chest x-ray of May 31, 2015 FINDINGS: The lungs are borderline hypoinflated. There is no focal infiltrate. There is no pleural effusion or pneumothorax. The heart and pulmonary vascularity are normal. The mediastinum is normal in width. There is multilevel degenerative disc space narrowing of the thoracic spine. IMPRESSION: There is no active cardiopulmonary disease. Electronically Signed   By: David  Martinique M.D.   On: 01/15/2016 10:36    Assessment and Plan :  1. Cough 2. Fatigue, unspecified type 3. Acute bronchitis, unspecified organism - POCT CBC - DG Chest 2 View; Future - POCT Influenza A/B - azithromycin (ZITHROMAX) 250 MG tablet; Take 2 tabs PO x 1 dose, then 1 tab PO QD x 4 days  Dispense: 6 tablet; Refill: 0 - Dextromethorphan-Guaifenesin (MUCINEX DM MAXIMUM STRENGTH) 60-1200 MG TB12; Take 1 tablet by mouth every 12 (twelve) hours.  Dispense: 14 each; Refill: 1 - benzonatate (TESSALON) 100 MG capsule; Take 1-2 capsules (100-200 mg total) by mouth 3 (three) times daily as needed for cough.  Dispense: 40 capsule; Refill: 0 - Chest x-ray and WBC are negative, however will treat empirically as his lung exam is remarkable for adventitious breath sounds on his left side.  - Supportive care encouraged: Hot steamy showers and netti-pot to help remove mucus. Drink plenty of fluids and rest. RTC in 5-7 days if no improvement. Patient understands and agrees with plan.   Mercer Pod, PA-C  [facility] 01/15/2016 9:49 AM

## 2016-01-17 ENCOUNTER — Encounter: Payer: Self-pay | Admitting: Family Medicine

## 2016-01-17 ENCOUNTER — Ambulatory Visit (INDEPENDENT_AMBULATORY_CARE_PROVIDER_SITE_OTHER): Payer: Medicare Other | Admitting: Family Medicine

## 2016-01-17 VITALS — BP 118/80 | HR 70 | Temp 97.5°F | Resp 12 | Ht 67.0 in | Wt 151.4 lb

## 2016-01-17 DIAGNOSIS — Z125 Encounter for screening for malignant neoplasm of prostate: Secondary | ICD-10-CM

## 2016-01-17 DIAGNOSIS — E559 Vitamin D deficiency, unspecified: Secondary | ICD-10-CM

## 2016-01-17 DIAGNOSIS — G40909 Epilepsy, unspecified, not intractable, without status epilepticus: Secondary | ICD-10-CM

## 2016-01-17 DIAGNOSIS — J45909 Unspecified asthma, uncomplicated: Secondary | ICD-10-CM

## 2016-01-17 DIAGNOSIS — Z13220 Encounter for screening for lipoid disorders: Secondary | ICD-10-CM

## 2016-01-17 DIAGNOSIS — Z0000 Encounter for general adult medical examination without abnormal findings: Secondary | ICD-10-CM | POA: Diagnosis not present

## 2016-01-17 DIAGNOSIS — F411 Generalized anxiety disorder: Secondary | ICD-10-CM

## 2016-01-17 DIAGNOSIS — E038 Other specified hypothyroidism: Secondary | ICD-10-CM

## 2016-01-17 DIAGNOSIS — Z131 Encounter for screening for diabetes mellitus: Secondary | ICD-10-CM

## 2016-01-17 LAB — COMPREHENSIVE METABOLIC PANEL
ALBUMIN: 4.1 g/dL (ref 3.5–5.2)
ALT: 21 U/L (ref 0–53)
AST: 26 U/L (ref 0–37)
Alkaline Phosphatase: 87 U/L (ref 39–117)
BILIRUBIN TOTAL: 0.4 mg/dL (ref 0.2–1.2)
BUN: 8 mg/dL (ref 6–23)
CALCIUM: 8.9 mg/dL (ref 8.4–10.5)
CO2: 33 meq/L — AB (ref 19–32)
CREATININE: 1 mg/dL (ref 0.40–1.50)
Chloride: 101 mEq/L (ref 96–112)
GFR: 83.65 mL/min (ref >60.00)
Glucose, Bld: 81 mg/dL (ref 70–99)
Potassium: 4.3 mEq/L (ref 3.5–5.1)
Sodium: 139 mEq/L (ref 135–145)
Total Protein: 7.1 g/dL (ref 6.0–8.3)

## 2016-01-17 LAB — PSA: PSA: 1.52 ng/mL (ref 0.10–4.00)

## 2016-01-17 LAB — LIPID PANEL
Cholesterol: 213 mg/dL — ABNORMAL HIGH (ref 0–200)
HDL: 33.3 mg/dL — AB (ref >39.00)
LDL Cholesterol: 147 mg/dL — ABNORMAL HIGH (ref 0–99)
NONHDL: 179.95
TRIGLYCERIDES: 165 mg/dL — AB (ref 0.0–149.0)
Total CHOL/HDL Ratio: 6
VLDL: 33 mg/dL (ref 0.0–40.0)

## 2016-01-17 LAB — VITAMIN D 25 HYDROXY (VIT D DEFICIENCY, FRACTURES): VITD: 68.7 ng/mL (ref 30.00–100.00)

## 2016-01-17 MED ORDER — ALBUTEROL SULFATE HFA 108 (90 BASE) MCG/ACT IN AERS: 2.0000 | INHALATION_SPRAY | Freq: Three times a day (TID) | RESPIRATORY_TRACT | 0 refills | Status: DC | PRN

## 2016-01-17 MED ORDER — FLUOXETINE HCL 10 MG PO TABS: 10.0000 mg | ORAL_TABLET | Freq: Every day | ORAL | 3 refills | Status: DC

## 2016-01-17 NOTE — Patient Instructions (Addendum)
A few things to remember from today's visit:   Diabetes mellitus screening - Plan: Comprehensive metabolic panel  Prostate cancer screening - Plan: PSA(Must document that pt has been informed of limitations of PSA testing.)  Routine general medical examination at a health care facility - Plan: Lipid panel  Vitamin D deficiency - Plan: VITAMIN D 25 Hydroxy (Vit-D Deficiency, Fractures)  Lipid screening  Seizure disorder (HCC) - Plan: Dilantin (Phenytoin) level, total  Reactive airway disease without complication, unspecified asthma severity, unspecified whether persistent - Plan: albuterol (PROVENTIL HFA;VENTOLIN HFA) 108 (90 Base) MCG/ACT inhaler    At least 150 minutes of moderate exercise per week, daily brisk walking for 15-30 min is a good exercise option. Healthy diet low in saturated (animal) fats and sweets and consisting of fresh fruits and vegetables, lean meats such as fish and white chicken and whole grains.  - Vaccines:  Tdap vaccine every 10 years.  Shingles vaccine recommended at age 41, could be given after 51 years of age but not sure about insurance coverage.  Pneumonia vaccines:  Prevnar 13 at 65 and Pneumovax at 91. But because seizure history I prefer to given pneumonia shot when recovers from current illness.     -Screening recommendations for low/normal risk males:  Screening for diabetes at age 86-45 and every 3 years.    Lipid screening at 35 and every 3 years.   Colonoscopy for colon cancer screening at age 44 and until age 24.  Prostate cancer screening: some controversy.        Please be sure medication list is accurate. If a new problem present, please set up appointment sooner than planned today.
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
## 2016-01-17 NOTE — Progress Notes (Signed)
Pre visit review using our clinic review tool, if applicable. No additional management support is needed unless otherwise documented below in the visit note. 

## 2016-01-18 LAB — PHENYTOIN LEVEL, TOTAL: PHENYTOIN LVL: 11.8 ug/mL (ref 10.0–20.0)

## 2016-02-02 ENCOUNTER — Telehealth: Payer: Self-pay | Admitting: Family Medicine

## 2016-02-02 DIAGNOSIS — E038 Other specified hypothyroidism: Secondary | ICD-10-CM

## 2016-02-02 DIAGNOSIS — F411 Generalized anxiety disorder: Secondary | ICD-10-CM

## 2016-02-02 DIAGNOSIS — G40909 Epilepsy, unspecified, not intractable, without status epilepticus: Secondary | ICD-10-CM

## 2016-02-02 MED ORDER — SYNTHROID 75 MCG PO TABS: 75.0000 ug | ORAL_TABLET | Freq: Every day | ORAL | 3 refills | Status: DC

## 2016-02-02 MED ORDER — FLUOXETINE HCL 10 MG PO TABS: 10.0000 mg | ORAL_TABLET | Freq: Every day | ORAL | 3 refills | Status: DC

## 2016-02-02 MED ORDER — PHENYTOIN SODIUM EXTENDED 100 MG PO CAPS: ORAL_CAPSULE | ORAL | 3 refills | Status: DC

## 2016-02-02 NOTE — Telephone Encounter (Signed)
Pt needs new 90 day rx w/refills sent to [facility] [address]. Dilantin 100 mg, synthroid 75 mcg and fluoxetine 10 mg

## 2016-02-02 NOTE — Telephone Encounter (Signed)
Rxs sent

## 2016-03-14 ENCOUNTER — Ambulatory Visit (INDEPENDENT_AMBULATORY_CARE_PROVIDER_SITE_OTHER): Payer: Medicare HMO

## 2016-03-14 VITALS — BP 110/60 | HR 69 | Ht 68.0 in | Wt 156.2 lb

## 2016-03-14 DIAGNOSIS — Z0000 Encounter for general adult medical examination without abnormal findings: Secondary | ICD-10-CM

## 2016-03-14 NOTE — Patient Instructions (Addendum)
Damon [name] , Thank you for taking time to come for your Medicare Wellness Visit. I appreciate your ongoing commitment to your health goals. Please review the following plan we discussed and let me know if I can assist you in the future.   C/o of pain in right clavicle area  will defer to MD apt  will check on apt today  Educated on HIV;  Does not like blood draw and will defer to next annual labs.      These are the goals we discussed: to go to the GYM 3 times a week;  Mother to try blending vegetables and adding to soup broth; meatloaf etc.  Goals    None      This is a list of the screening recommended for you and due dates:  Health Maintenance  Topic Date Due  . HIV Screening  10/25/1979  . Colon Cancer Screening  02/13/2018  . Tetanus Vaccine  12/06/2024  . Flu Shot  Completed      Fall Prevention in the Home Introduction Falls can cause injuries. They can happen to people of all ages. There are many things you can do to make your home safe and to help prevent falls. What can I do on the outside of my home?  Regularly fix the edges of walkways and driveways and fix any cracks.  Remove anything that might make you trip as you walk through a door, such as a raised step or threshold.  Trim any bushes or trees on the path to your home.  Use bright outdoor lighting.  Clear any walking paths of anything that might make someone trip, such as rocks or tools.  Regularly check to see if handrails are loose or broken. Make sure that both sides of any steps have handrails.  Any raised decks and porches should have guardrails on the edges.  Have any leaves, snow, or ice cleared regularly.  Use sand or salt on walking paths during winter.  Clean up any spills in your garage right away. This includes oil or grease spills. What can I do in the bathroom?  Use night lights.  Install grab bars by the toilet and in the tub and shower. Do not use towel bars as grab  bars.  Use non-skid mats or decals in the tub or shower.  If you need to sit down in the shower, use a plastic, non-slip stool.  Keep the floor dry. Clean up any water that spills on the floor as soon as it happens.  Remove soap buildup in the tub or shower regularly.  Attach bath mats securely with double-sided non-slip rug tape.  Do not have throw rugs and other things on the floor that can make you trip. What can I do in the bedroom?  Use night lights.  Make sure that you have a light by your bed that is easy to reach.  Do not use any sheets or blankets that are too big for your bed. They should not hang down onto the floor.  Have a firm chair that has side arms. You can use this for support while you get dressed.  Do not have throw rugs and other things on the floor that can make you trip. What can I do in the kitchen?  Clean up any spills right away.  Avoid walking on wet floors.  Keep items that you use a lot in easy-to-reach places.  If you need to reach something above you, use a strong  step stool that has a grab bar.  Keep electrical cords out of the way.  Do not use floor polish or wax that makes floors slippery. If you must use wax, use non-skid floor wax.  Do not have throw rugs and other things on the floor that can make you trip. What can I do with my stairs?  Do not leave any items on the stairs.  Make sure that there are handrails on both sides of the stairs and use them. Fix handrails that are broken or loose. Make sure that handrails are as long as the stairways.  Check any carpeting to make sure that it is firmly attached to the stairs. Fix any carpet that is loose or worn.  Avoid having throw rugs at the top or bottom of the stairs. If you do have throw rugs, attach them to the floor with carpet tape.  Make sure that you have a light switch at the top of the stairs and the bottom of the stairs. If you do not have them, ask someone to add them for  you. What else can I do to help prevent falls?  Wear shoes that:  Do not have high heels.  Have rubber bottoms.  Are comfortable and fit you well.  Are closed at the toe. Do not wear sandals.  If you use a stepladder:  Make sure that it is fully opened. Do not climb a closed stepladder.  Make sure that both sides of the stepladder are locked into place.  Ask someone to hold it for you, if possible.  Clearly mark and make sure that you can see:  Any grab bars or handrails.  First and last steps.  Where the edge of each step is.  Use tools that help you move around (mobility aids) if they are needed. These include:  Canes.  Walkers.  Scooters.  Crutches.  Turn on the lights when you go into a dark area. Replace any light bulbs as soon as they burn out.  Set up your furniture so you have a clear path. Avoid moving your furniture around.  If any of your floors are uneven, fix them.  If there are any pets around you, be aware of where they are.  Review your medicines with your doctor. Some medicines can make you feel dizzy. This can increase your chance of falling. Ask your doctor what other things that you can do to help prevent falls. This information is not intended to replace advice given to you by your health care provider. Make sure you discuss any questions you have with your health care provider. Document Released: 12/15/2008 Document Revised: 07/27/2015 Document Reviewed: 03/25/2014  2017 Elsevier  Health Maintenance, Male A healthy lifestyle and preventative care can promote health and wellness.  Maintain regular health, dental, and eye exams.  Eat a healthy diet. Foods like vegetables, fruits, whole grains, low-fat dairy products, and lean protein foods contain the nutrients you need and are low in calories. Decrease your intake of foods high in solid fats, added sugars, and salt. Get information about a proper diet from your health care provider, if  necessary.  Regular physical exercise is one of the most important things you can do for your health. Most adults should get at least 150 minutes of moderate-intensity exercise (any activity that increases your heart rate and causes you to sweat) each week. In addition, most adults need muscle-strengthening exercises on 2 or more days a week.   Maintain a healthy  weight. The body mass index (BMI) is a screening tool to identify possible weight problems. It provides an estimate of body fat based on height and weight. Your health care provider can find your BMI and can help you achieve or maintain a healthy weight. For males 20 years and older:  A BMI below 18.5 is considered underweight.  A BMI of 18.5 to 24.9 is normal.  A BMI of 25 to 29.9 is considered overweight.  A BMI of 30 and above is considered obese.  Maintain normal blood lipids and cholesterol by exercising and minimizing your intake of saturated fat. Eat a balanced diet with plenty of fruits and vegetables. Blood tests for lipids and cholesterol should begin at age 44 and be repeated every 5 years. If your lipid or cholesterol levels are high, you are over age 11, or you are at high risk for heart disease, you may need your cholesterol levels checked more frequently.Ongoing high lipid and cholesterol levels should be treated with medicines if diet and exercise are not working.  If you smoke, find out from your health care provider how to quit. If you do not use tobacco, do not start.  Lung cancer screening is recommended for adults aged 1-80 years who are at high risk for developing lung cancer because of a history of smoking. A yearly low-dose CT scan of the lungs is recommended for people who have at least a 30-pack-year history of smoking and are current smokers or have quit within the past 15 years. A pack year of smoking is smoking an average of 1 pack of cigarettes a day for 1 year (for example, a 30-pack-year history of  smoking could mean smoking 1 pack a day for 30 years or 2 packs a day for 15 years). Yearly screening should continue until the smoker has stopped smoking for at least 15 years. Yearly screening should be stopped for people who develop a health problem that would prevent them from having lung cancer treatment.  If you choose to drink alcohol, do not have more than 2 drinks per day. One drink is considered to be 12 oz (360 mL) of beer, 5 oz (150 mL) of wine, or 1.5 oz (45 mL) of liquor.  Avoid the use of street drugs. Do not share needles with anyone. Ask for help if you need support or instructions about stopping the use of drugs.  High blood pressure causes heart disease and increases the risk of stroke. High blood pressure is more likely to develop in:  People who have blood pressure in the end of the normal range (100-139/85-89 mm Hg).  People who are overweight or obese.  People who are African American.  If you are 73-42 years of age, have your blood pressure checked every 3-5 years. If you are 37 years of age or older, have your blood pressure checked every year. You should have your blood pressure measured twice-once when you are at a hospital or clinic, and once when you are not at a hospital or clinic. Record the average of the two measurements. To check your blood pressure when you are not at a hospital or clinic, you can use:  An automated blood pressure machine at a pharmacy.  A home blood pressure monitor.  If you are 76-51 years old, ask your health care provider if you should take aspirin to prevent heart disease.  Diabetes screening involves taking a blood sample to check your fasting blood sugar level. This should be done  once every 3 years after age 61 if you are at a normal weight and without risk factors for diabetes. Testing should be considered at a younger age or be carried out more frequently if you are overweight and have at least 1 risk factor for  diabetes.  Colorectal cancer can be detected and often prevented. Most routine colorectal cancer screening begins at the age of 20 and continues through age 29. However, your health care provider may recommend screening at an earlier age if you have risk factors for colon cancer. On a yearly basis, your health care provider may provide home test kits to check for hidden blood in the stool. A small camera at the end of a tube may be used to directly examine the colon (sigmoidoscopy or colonoscopy) to detect the earliest forms of colorectal cancer. Talk to your health care provider about this at age 41 when routine screening begins. A direct exam of the colon should be repeated every 5-10 years through age 70, unless early forms of precancerous polyps or small growths are found.  People who are at an increased risk for hepatitis B should be screened for this virus. You are considered at high risk for hepatitis B if:  You were born in a country where hepatitis B occurs often. Talk with your health care provider about which countries are considered high risk.  Your parents were born in a high-risk country and you have not received a shot to protect against hepatitis B (hepatitis B vaccine).  You have HIV or AIDS.  You use needles to inject street drugs.  You live with, or have sex with, someone who has hepatitis B.  You are a man who has sex with other men (MSM).  You get hemodialysis treatment.  You take certain medicines for conditions like cancer, organ transplantation, and autoimmune conditions.  Hepatitis C blood testing is recommended for all people born from [birth year] through 1966 and any individual with known risk factors for hepatitis C.  Healthy men should no longer receive prostate-specific antigen (PSA) blood tests as part of routine cancer screening. Talk to your health care provider about prostate cancer screening.  Testicular cancer screening is not recommended for adolescents or  adult males who have no symptoms. Screening includes self-exam, a health care provider exam, and other screening tests. Consult with your health care provider about any symptoms you have or any concerns you have about testicular cancer.  Practice safe sex. Use condoms and avoid high-risk sexual practices to reduce the spread of sexually transmitted infections (STIs).  You should be screened for STIs, including gonorrhea and chlamydia if:  You are sexually active and are younger than 24 years.  You are older than 24 years, and your health care provider tells you that you are at risk for this type of infection.  Your sexual activity has changed since you were last screened, and you are at an increased risk for chlamydia or gonorrhea. Ask your health care provider if you are at risk.  If you are at risk of being infected with HIV, it is recommended that you take a prescription medicine daily to prevent HIV infection. This is called pre-exposure prophylaxis (PrEP). You are considered at risk if:  You are a man who has sex with other men (MSM).  You are a heterosexual man who is sexually active with multiple partners.  You take drugs by injection.  You are sexually active with a partner who has HIV.  Talk with your  health care provider about whether you are at high risk of being infected with HIV. If you choose to begin PrEP, you should first be tested for HIV. You should then be tested every 3 months for as long as you are taking PrEP.  Use sunscreen. Apply sunscreen liberally and repeatedly throughout the day. You should seek shade when your shadow is shorter than you. Protect yourself by wearing long sleeves, pants, a wide-brimmed hat, and sunglasses year round whenever you are outdoors.  Tell your health care provider of new moles or changes in moles, especially if there is a change in shape or color. Also, tell your health care provider if a mole is larger than the size of a pencil  eraser.  A one-time screening for abdominal aortic aneurysm (AAA) and surgical repair of large AAAs by ultrasound is recommended for men aged 55-75 years who are current or former smokers.  Stay current with your vaccines (immunizations). This information is not intended to replace advice given to you by your health care provider. Make sure you discuss any questions you have with your health care provider. Document Released: 08/17/2007 Document Revised: 03/11/2014 Document Reviewed: 11/22/2014 Elsevier Interactive Patient Education  2017 [employer].
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
## 2016-03-15 ENCOUNTER — Encounter: Payer: Self-pay | Admitting: Family Medicine

## 2016-03-15 ENCOUNTER — Ambulatory Visit (INDEPENDENT_AMBULATORY_CARE_PROVIDER_SITE_OTHER): Payer: Medicare HMO | Admitting: Family Medicine

## 2016-03-15 VITALS — BP 140/82 | HR 78 | Temp 98.0°F | Resp 12 | Wt 155.8 lb

## 2016-03-15 DIAGNOSIS — M25511 Pain in right shoulder: Secondary | ICD-10-CM

## 2016-03-15 DIAGNOSIS — R053 Chronic cough: Secondary | ICD-10-CM

## 2016-03-15 DIAGNOSIS — K219 Gastro-esophageal reflux disease without esophagitis: Secondary | ICD-10-CM | POA: Diagnosis not present

## 2016-03-15 DIAGNOSIS — J309 Allergic rhinitis, unspecified: Secondary | ICD-10-CM | POA: Insufficient documentation

## 2016-03-15 DIAGNOSIS — R05 Cough: Secondary | ICD-10-CM | POA: Diagnosis not present

## 2016-03-15 MED ORDER — MELOXICAM 15 MG PO TABS: 15.0000 mg | ORAL_TABLET | Freq: Every day | ORAL | 0 refills | Status: AC

## 2016-03-15 MED ORDER — OMEPRAZOLE 20 MG PO CPDR: 20.0000 mg | DELAYED_RELEASE_CAPSULE | Freq: Every day | ORAL | 3 refills | Status: DC

## 2016-03-15 NOTE — Patient Instructions (Addendum)
A few things to remember from today's visit:   ACUTE VISIT:  Chronic cough  Sternoclavicular joint pain, right - Plan: meloxicam (MOBIC) 15 MG tablet  Gastroesophageal reflux disease, esophagitis presence not specified - Plan: omeprazole (PRILOSEC) 20 MG capsule  Local Asper Cream with Lidocaine over the counter , avoid activities that triger or aggravate pain. Mobic for pain and Acetaminophen over the counter.     Avoid foods that make your symptoms worse, for example coffee, chocolate,pepermeint,alcohol, and greasy food. Raising the head of your bed about 6 inches may help with nocturnal symptoms.   Weight loss (if you are overweight). Avoid lying down for 3 hours after eating.  Instead 3 large meals daily try small and more frequent meals during the day.   You should be evaluated immediately if bloody vomiting, bloody stools, black stools (like tar), difficulty swallowing, food gets stuck on the way down or choking when eating. Abnormal weight loss or severe abdominal pain.    Please be sure medication list is accurate. If a new problem present, please set up appointment sooner than planned today.

## 2016-03-15 NOTE — Progress Notes (Signed)
I have reviewed documentation from this visit and I agree with recommendations given. I will plan on discussing PSA for prostate cancer screening next follow up visit.  Betty G. Martinique, MD  [facility]. [place] office.
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
## 2016-04-26 ENCOUNTER — Encounter: Payer: Self-pay | Admitting: Family Medicine

## 2016-04-26 ENCOUNTER — Ambulatory Visit (INDEPENDENT_AMBULATORY_CARE_PROVIDER_SITE_OTHER): Payer: Medicare HMO | Admitting: Family Medicine

## 2016-04-26 DIAGNOSIS — J45909 Unspecified asthma, uncomplicated: Secondary | ICD-10-CM

## 2016-04-26 DIAGNOSIS — R05 Cough: Secondary | ICD-10-CM

## 2016-04-26 DIAGNOSIS — R053 Chronic cough: Secondary | ICD-10-CM

## 2016-04-26 DIAGNOSIS — J45991 Cough variant asthma: Secondary | ICD-10-CM | POA: Insufficient documentation

## 2016-04-26 MED ORDER — BECLOMETHASONE DIPROPIONATE 80 MCG/ACT IN AERS: 2.0000 | INHALATION_SPRAY | Freq: Two times a day (BID) | RESPIRATORY_TRACT | 4 refills | Status: DC

## 2016-04-26 MED ORDER — ALBUTEROL SULFATE HFA 108 (90 BASE) MCG/ACT IN AERS: 2.0000 | INHALATION_SPRAY | Freq: Three times a day (TID) | RESPIRATORY_TRACT | 1 refills | Status: DC | PRN

## 2016-04-26 MED ORDER — MONTELUKAST SODIUM 10 MG PO TABS: 10.0000 mg | ORAL_TABLET | Freq: Every day | ORAL | 3 refills | Status: DC

## 2016-04-26 NOTE — Patient Instructions (Addendum)
A few things to remember from today's visit:   Chronic cough  Reactive airway disease without complication, unspecified asthma severity, unspecified whether persistent - Plan: albuterol (PROVENTIL HFA;VENTOLIN HFA) 108 (90 Base) MCG/ACT inhaler  In about 4-6 weeks please let me know through My Chart or by calling the office about tolerance of new medication.  Stop Omeprazole and Zantac, monitor for changes in cough.   Please be sure medication list is accurate. If a new problem present, please set up appointment sooner than planned today.

## 2016-04-26 NOTE — Progress Notes (Signed)
Pre visit review using our clinic review tool, if applicable. No additional management support is needed unless otherwise documented below in the visit note. 

## 2016-06-27 DIAGNOSIS — H40013 Open angle with borderline findings, low risk, bilateral: Secondary | ICD-10-CM | POA: Diagnosis not present

## 2016-07-03 ENCOUNTER — Ambulatory Visit: Payer: Medicare Other | Admitting: Family Medicine

## 2016-07-18 ENCOUNTER — Ambulatory Visit (INDEPENDENT_AMBULATORY_CARE_PROVIDER_SITE_OTHER): Payer: Medicare HMO | Admitting: Family Medicine

## 2016-07-18 ENCOUNTER — Encounter: Payer: Self-pay | Admitting: Family Medicine

## 2016-07-18 VITALS — BP 124/78 | HR 80 | Temp 97.7°F | Resp 12 | Ht 68.0 in | Wt 149.5 lb

## 2016-07-18 DIAGNOSIS — J45991 Cough variant asthma: Secondary | ICD-10-CM | POA: Diagnosis not present

## 2016-07-18 DIAGNOSIS — J069 Acute upper respiratory infection, unspecified: Secondary | ICD-10-CM

## 2016-07-18 MED ORDER — BENZONATATE 100 MG PO CAPS: 200.0000 mg | ORAL_CAPSULE | Freq: Two times a day (BID) | ORAL | 0 refills | Status: AC | PRN

## 2016-07-18 MED ORDER — BECLOMETHASONE DIPROPIONATE 80 MCG/ACT IN AERS: 2.0000 | INHALATION_SPRAY | Freq: Two times a day (BID) | RESPIRATORY_TRACT | 2 refills | Status: DC

## 2016-07-18 NOTE — Patient Instructions (Signed)
A few things to remember from today's visit:   Viral upper respiratory tract infection - Plan: benzonatate (TESSALON) 100 MG capsule  Cough variant asthma - Plan: beclomethasone (QVAR) 80 MCG/ACT inhaler  viral infections are self-limited and we treat each symptom depending of severity.  Over the counter medications as decongestants and cold medications usually help, they need to be taken with caution if there is a history of high blood pressure or palpitations.  Plenty of fluids. Honey helps with cough. Steam inhalations helps with runny nose, nasal congestion, and may prevent sinus infections. Cough and nasal congestion could last a few days and sometimes weeks. Please follow in not any better in 1-2 weeks or if symptoms get worse.  Please be sure medication list is accurate. If a new problem present, please set up appointment sooner than planned today.
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
## 2016-08-12 ENCOUNTER — Other Ambulatory Visit: Payer: Self-pay | Admitting: Family Medicine

## 2016-08-12 DIAGNOSIS — J45909 Unspecified asthma, uncomplicated: Secondary | ICD-10-CM

## 2016-08-26 ENCOUNTER — Ambulatory Visit: Payer: Medicare HMO | Admitting: Family Medicine

## 2017-02-24 ENCOUNTER — Other Ambulatory Visit: Payer: Self-pay | Admitting: Family Medicine

## 2017-02-24 DIAGNOSIS — F411 Generalized anxiety disorder: Secondary | ICD-10-CM

## 2017-02-24 DIAGNOSIS — J45909 Unspecified asthma, uncomplicated: Secondary | ICD-10-CM

## 2017-04-08 ENCOUNTER — Emergency Department (HOSPITAL_COMMUNITY)
Admission: EM | Admit: 2017-04-08 | Discharge: 2017-04-08 | Disposition: A | Discharge status: Home / Self Care | Payer: Medicare HMO | Attending: Emergency Medicine | Admitting: Emergency Medicine

## 2017-04-08 ENCOUNTER — Encounter (HOSPITAL_COMMUNITY): Payer: Self-pay | Admitting: Emergency Medicine

## 2017-04-08 ENCOUNTER — Emergency Department (HOSPITAL_COMMUNITY): Payer: Medicare HMO

## 2017-04-08 DIAGNOSIS — Z79899 Other long term (current) drug therapy: Secondary | ICD-10-CM | POA: Insufficient documentation

## 2017-04-08 DIAGNOSIS — M25522 Pain in left elbow: Secondary | ICD-10-CM | POA: Diagnosis not present

## 2017-04-08 DIAGNOSIS — S59902A Unspecified injury of left elbow, initial encounter: Secondary | ICD-10-CM | POA: Diagnosis not present

## 2017-04-08 DIAGNOSIS — F419 Anxiety disorder, unspecified: Secondary | ICD-10-CM | POA: Diagnosis not present

## 2017-04-08 DIAGNOSIS — W19XXXA Unspecified fall, initial encounter: Secondary | ICD-10-CM

## 2017-04-08 DIAGNOSIS — E039 Hypothyroidism, unspecified: Secondary | ICD-10-CM | POA: Insufficient documentation

## 2017-04-08 DIAGNOSIS — J45909 Unspecified asthma, uncomplicated: Secondary | ICD-10-CM | POA: Diagnosis not present
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
## 2017-04-08 NOTE — ED Triage Notes (Signed)
Patient was walking carrying on with sisters and fell in parking lot. Patient c/o left elbow pain esp when extending arm. No LOC or blood thinners.  Patient had ibuprofen 400mg  after fall

## 2017-04-08 NOTE — ED Notes (Signed)
Sling applied to L arm. Pt verbalized how to reapply sling.

## 2017-04-08 NOTE — Discharge Instructions (Signed)
It was my pleasure taking care of you today!   Fortunately, your x-ray was negative.   Tylenol and / or ibuprofen as needed for pain.   Ice affected area for additional pain / swelling relief.   Wear sling as needed for comfort over the next 2-3 days, but then start moving the arm gently so that it does not get stiff.   If symptoms persist longer than 1 week, please call the orthopedic doctor listed to schedule a follow up appointment.   Return to ER for new or worsening symptoms, any additional concerns.

## 2017-04-09 ENCOUNTER — Other Ambulatory Visit: Payer: Self-pay | Admitting: Family Medicine

## 2017-04-09 DIAGNOSIS — E038 Other specified hypothyroidism: Secondary | ICD-10-CM

## 2017-04-09 DIAGNOSIS — G40909 Epilepsy, unspecified, not intractable, without status epilepticus: Secondary | ICD-10-CM

## 2017-04-11 ENCOUNTER — Ambulatory Visit (INDEPENDENT_AMBULATORY_CARE_PROVIDER_SITE_OTHER): Payer: Medicare HMO | Admitting: Family Medicine

## 2017-04-11 ENCOUNTER — Encounter: Payer: Self-pay | Admitting: Family Medicine

## 2017-04-11 VITALS — BP 130/82 | HR 77 | Temp 97.7°F | Resp 16 | Ht 68.0 in | Wt 148.2 lb

## 2017-04-11 DIAGNOSIS — M25522 Pain in left elbow: Secondary | ICD-10-CM

## 2017-04-11 DIAGNOSIS — E559 Vitamin D deficiency, unspecified: Secondary | ICD-10-CM

## 2017-04-11 DIAGNOSIS — G40909 Epilepsy, unspecified, not intractable, without status epilepticus: Secondary | ICD-10-CM

## 2017-04-11 DIAGNOSIS — R053 Chronic cough: Secondary | ICD-10-CM

## 2017-04-11 DIAGNOSIS — E785 Hyperlipidemia, unspecified: Secondary | ICD-10-CM | POA: Diagnosis not present

## 2017-04-11 DIAGNOSIS — Z20828 Contact with and (suspected) exposure to other viral communicable diseases: Secondary | ICD-10-CM

## 2017-04-11 DIAGNOSIS — R05 Cough: Secondary | ICD-10-CM

## 2017-04-11 DIAGNOSIS — E038 Other specified hypothyroidism: Secondary | ICD-10-CM | POA: Diagnosis not present

## 2017-04-11 DIAGNOSIS — F411 Generalized anxiety disorder: Secondary | ICD-10-CM | POA: Diagnosis not present

## 2017-04-11 DIAGNOSIS — J069 Acute upper respiratory infection, unspecified: Secondary | ICD-10-CM | POA: Diagnosis not present

## 2017-04-11 LAB — POC INFLUENZA A&B (BINAX/QUICKVUE)
INFLUENZA A, POC: NEGATIVE
INFLUENZA B, POC: NEGATIVE

## 2017-04-11 MED ORDER — FLUOXETINE HCL 10 MG PO TABS: 10.0000 mg | ORAL_TABLET | Freq: Every day | ORAL | 3 refills | Status: DC

## 2017-04-11 MED ORDER — PHENYTOIN SODIUM EXTENDED 100 MG PO CAPS: ORAL_CAPSULE | ORAL | 3 refills | Status: DC

## 2017-04-11 MED ORDER — OMEPRAZOLE 20 MG PO CPDR: 20.0000 mg | DELAYED_RELEASE_CAPSULE | Freq: Two times a day (BID) | ORAL | 2 refills | Status: DC

## 2017-04-11 MED ORDER — OSELTAMIVIR PHOSPHATE 75 MG PO CAPS: 75.0000 mg | ORAL_CAPSULE | Freq: Two times a day (BID) | ORAL | 0 refills | Status: AC

## 2017-04-11 MED ORDER — CELECOXIB 100 MG PO CAPS: 100.0000 mg | ORAL_CAPSULE | Freq: Two times a day (BID) | ORAL | 0 refills | Status: AC

## 2017-04-11 MED ORDER — SYNTHROID 75 MCG PO TABS: 75.0000 ug | ORAL_TABLET | Freq: Every day | ORAL | 3 refills | Status: DC

## 2017-04-11 NOTE — Assessment & Plan Note (Signed)
Continue nonpharmacologic treatment. Future labs placed, further recommendations will be given accordingly. Follow-up in 6-12 months.

## 2017-04-11 NOTE — Assessment & Plan Note (Signed)
>>  ASSESSMENT AND PLAN FOR SEIZURE DISORDER (HCC) WRITTEN ON 04/11/2017  1:08 PM BY [place], BETTY G, MD  Stable overall, no recent episodes of seizure activity. No changes in Phenytoin dose. We will schedule labs in 2-3 weeks and further recommendations will be given accordingly. Follow-up in 1 year.

## 2017-04-11 NOTE — Patient Instructions (Addendum)
A few things to remember from today's visit:   URI, acute  Seizure disorder ([place]) - Plan: phenytoin (DILANTIN) 100 MG ER capsule  Generalized anxiety disorder - Plan: FLUoxetine (PROZAC) 10 MG tablet  Other specified hypothyroidism - Plan: SYNTHROID 75 MCG tablet  Chronic cough - Plan: Ambulatory referral to Pulmonology, POC Influenza A&B (Binax test)  Vitamin D deficiency  Left elbow pain - Plan: celecoxib (CELEBREX) 100 MG capsule  Exposure to influenza - Plan: oseltamivir (TAMIFLU) 75 MG capsule  Labs in 2-3 weeks when he recovers from this acute illness.  Celebrex for elbow pain. viral infections are self-limited and we treat each symptom depending of severity.  Over the counter medications as decongestants and cold medications usually help, they need to be taken with caution if there is a history of high blood pressure or palpitations. Tylenol also helps with most symptoms (headache, muscle aching, fever,etc) Plenty of fluids. Honey helps with cough. Steam inhalations helps with runny nose, nasal congestion, and may prevent sinus infections. Cough and nasal congestion could last a few days and sometimes weeks. Please follow in not any better in 1-2 weeks or if symptoms get worse.  Elbow Contusion An elbow contusion is a deep bruise of the elbow. Deep bruises happen when an injury causes bleeding under the skin. The skin over the deep bruise may turn blue, purple, or yellow. Minor injuries will give you a deep bruise that is painless, but deep bruises that are worse may stay painful and swollen for a few weeks. In general, the best treatment for this condition includes rest, ice, pressure (compression), and elevation. This is often called RICE therapy. Follow these instructions at home: [place] the injured area.  If directed, put ice on the injured area: ? Put ice in a plastic bag. ? Place a towel between your skin and the bag. ? Leave the ice on for 20  minutes, 2-3 times per day.  If directed, put light pressure (compression) on the injured area using an elastic bandage. ? Make sure the bandage is not wrapped too tightly. ? Remove and reapply the bandage as told by your doctor.  Raise (elevate) the injured area above the level of your heart while you are sitting or lying down. If you have a splint:  Wear the splint as told by your doctor. Remove it only as told by your doctor.  Loosen the splint if your fingers tingle, become numb, or turn cold and blue.  Do not let your splint get wet if it is not waterproof.  If your splint is not waterproof, cover it with a watertight plastic bag when you take a bath or a shower.  Keep the splint clean. General instructions  Return to your normal activities as told by your doctor. Ask your doctor what activities are safe for you.  Wear your sling as told by your doctor, if this applies.  Use your elbow only as told by your doctor. You may be asked to do range-of-motion exercises. Do them as told.  Take over-the-counter and prescription medicines only as told by your doctor.  Keep all follow-up visits as told by your doctor. This is important. Contact a doctor if:  Your symptoms do not get better after many days of treatment.  You have more redness, swelling, or pain in your elbow.  You have trouble moving the injured area.  Medicine does not help your pain or swelling. Get help right away if:  You have very  bad pain.  You have numbness in your hand or fingers.  Your hand or fingers turn very light (pale) or cold.  You have swelling of your hand and fingers.  You cannot move your fingers or wrist. This information is not intended to replace advice given to you by your health care provider. Make sure you discuss any questions you have with your health care provider. Document Released: 02/07/2011 Document Revised: 07/27/2015 Document Reviewed: 10/03/2014 Elsevier Interactive  Patient Education  2018 [employer].   Please be sure medication list is accurate. If a new problem present, please set up appointment sooner than planned today.

## 2017-04-11 NOTE — Assessment & Plan Note (Signed)
Continue current dose of Levothyroxine. Further recommendation will be given depending on TSH results. Follow-up in a year.
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
## 2017-04-11 NOTE — Assessment & Plan Note (Signed)
Well-controlled on current management. No changes in Fluoxetine 10 mg daily. Instructed about warning signs. Follow-up in a year.

## 2017-04-11 NOTE — Assessment & Plan Note (Signed)
Stable overall, no recent episodes of seizure activity. No changes in Phenytoin dose. We will schedule labs in 2-3 weeks and further recommendations will be given accordingly. Follow-up in 1 year.

## 2017-04-11 NOTE — Assessment & Plan Note (Signed)
Continue OTC vitamin D supplementation. Further recommendations will be given according to lab results.

## 2017-04-16 ENCOUNTER — Ambulatory Visit: Payer: Medicare HMO | Admitting: Family Medicine

## 2017-05-07 ENCOUNTER — Other Ambulatory Visit (INDEPENDENT_AMBULATORY_CARE_PROVIDER_SITE_OTHER): Payer: Medicare HMO

## 2017-05-07 DIAGNOSIS — G40909 Epilepsy, unspecified, not intractable, without status epilepticus: Secondary | ICD-10-CM | POA: Diagnosis not present

## 2017-05-07 DIAGNOSIS — E038 Other specified hypothyroidism: Secondary | ICD-10-CM

## 2017-05-07 DIAGNOSIS — E785 Hyperlipidemia, unspecified: Secondary | ICD-10-CM

## 2017-05-07 DIAGNOSIS — E559 Vitamin D deficiency, unspecified: Secondary | ICD-10-CM

## 2017-05-07 LAB — COMPREHENSIVE METABOLIC PANEL
ALBUMIN: 4.1 g/dL (ref 3.5–5.2)
ALK PHOS: 73 U/L (ref 39–117)
ALT: 17 U/L (ref 0–53)
AST: 19 U/L (ref 0–37)
BUN: 9 mg/dL (ref 6–23)
CALCIUM: 9.5 mg/dL (ref 8.4–10.5)
CO2: 32 mEq/L (ref 19–32)
Chloride: 102 mEq/L (ref 96–112)
Creatinine, Ser: 0.94 mg/dL (ref 0.40–1.50)
GFR: 89.39 mL/min (ref >60.00)
Glucose, Bld: 90 mg/dL (ref 70–99)
POTASSIUM: 4.7 meq/L (ref 3.5–5.1)
SODIUM: 140 meq/L (ref 135–145)
Total Bilirubin: 0.4 mg/dL (ref 0.2–1.2)
Total Protein: 7.1 g/dL (ref 6.0–8.3)

## 2017-05-07 LAB — LIPID PANEL
CHOLESTEROL: 197 mg/dL (ref 0–200)
HDL: 37.4 mg/dL — ABNORMAL LOW (ref >39.00)
LDL Cholesterol: 137 mg/dL — ABNORMAL HIGH (ref 0–99)
NonHDL: 159.48
Total CHOL/HDL Ratio: 5
Triglycerides: 113 mg/dL (ref 0.0–149.0)
VLDL: 22.6 mg/dL (ref 0.0–40.0)

## 2017-05-07 LAB — CBC
HEMATOCRIT: 43.6 % (ref 39.0–52.0)
Hemoglobin: 15 g/dL (ref 13.0–17.0)
MCHC: 34.3 g/dL (ref 30.0–36.0)
MCV: 89.6 fl (ref 78.0–100.0)
Platelets: 198 10*3/uL (ref 150.0–400.0)
RBC: 4.87 Mil/uL (ref 4.22–5.81)
RDW: 13.2 % (ref 11.5–15.5)
WBC: 3.4 10*3/uL — AB (ref 4.0–10.5)

## 2017-05-07 LAB — TSH: TSH: 4.53 u[IU]/mL — ABNORMAL HIGH (ref 0.35–4.50)

## 2017-05-07 LAB — VITAMIN D 25 HYDROXY (VIT D DEFICIENCY, FRACTURES): VITD: 54.4 ng/mL (ref 30.00–100.00)

## 2017-05-08 LAB — PARATHYROID HORMONE, INTACT (NO CA): PTH: 83 pg/mL — ABNORMAL HIGH (ref 14–64)

## 2017-05-08 LAB — PHENYTOIN LEVEL, TOTAL: Phenytoin, Total: 13.1 mg/L (ref 10.0–20.0)

## 2017-05-12 ENCOUNTER — Other Ambulatory Visit: Payer: Self-pay | Admitting: Family Medicine

## 2017-05-12 DIAGNOSIS — E038 Other specified hypothyroidism: Secondary | ICD-10-CM

## 2017-05-13 ENCOUNTER — Other Ambulatory Visit (INDEPENDENT_AMBULATORY_CARE_PROVIDER_SITE_OTHER): Payer: Medicare HMO

## 2017-05-13 ENCOUNTER — Ambulatory Visit: Payer: Medicare HMO | Admitting: Internal Medicine

## 2017-05-13 ENCOUNTER — Ambulatory Visit (INDEPENDENT_AMBULATORY_CARE_PROVIDER_SITE_OTHER)
Admission: RE | Admit: 2017-05-13 | Discharge: 2017-05-13 | Disposition: A | Discharge status: Home / Self Care | Payer: Medicare HMO | Source: Ambulatory Visit | Attending: Internal Medicine | Admitting: Internal Medicine

## 2017-05-13 ENCOUNTER — Encounter: Payer: Self-pay | Admitting: Internal Medicine

## 2017-05-13 VITALS — BP 124/70 | HR 74 | Ht 68.0 in | Wt 148.0 lb

## 2017-05-13 DIAGNOSIS — R05 Cough: Secondary | ICD-10-CM

## 2017-05-13 DIAGNOSIS — R058 Other specified cough: Secondary | ICD-10-CM

## 2017-05-13 LAB — CBC WITH DIFFERENTIAL/PLATELET
Basophils Absolute: 0 10*3/uL (ref 0.0–0.1)
Basophils Relative: 0.6 % (ref 0.0–3.0)
Eosinophils Absolute: 0 10*3/uL (ref 0.0–0.7)
Eosinophils Relative: 0.8 % (ref 0.0–5.0)
HCT: 41.3 % (ref 39.0–52.0)
Hemoglobin: 14.2 g/dL (ref 13.0–17.0)
LYMPHS ABS: 1.3 10*3/uL (ref 0.7–4.0)
Lymphocytes Relative: 23.3 % (ref 12.0–46.0)
MCHC: 34.3 g/dL (ref 30.0–36.0)
MCV: 89.6 fl (ref 78.0–100.0)
MONOS PCT: 8.2 % (ref 3.0–12.0)
Monocytes Absolute: 0.5 10*3/uL (ref 0.1–1.0)
NEUTROS ABS: 3.7 10*3/uL (ref 1.4–7.7)
NEUTROS PCT: 67.1 % (ref 43.0–77.0)
Platelets: 178 10*3/uL (ref 150.0–400.0)
RBC: 4.61 Mil/uL (ref 4.22–5.81)
RDW: 13.1 % (ref 11.5–15.5)
WBC: 5.6 10*3/uL (ref 4.0–10.5)

## 2017-05-13 MED ORDER — FAMOTIDINE 20 MG PO TABS: ORAL_TABLET | ORAL | 11 refills | Status: DC

## 2017-05-13 MED ORDER — OMEPRAZOLE 40 MG PO CPDR: 40.0000 mg | DELAYED_RELEASE_CAPSULE | Freq: Every day | ORAL | 2 refills | Status: DC

## 2017-05-13 NOTE — Progress Notes (Signed)
Subjective:     Patient ID: Damon [name]., male   DOB: Nov 10, 1965,    MRN: [id number]  HPI   61yowm  never smoker born deaf with new onset cough daily since winter 2018 with allergy pills including singulair  and inhaler reflux rx 20mg  no better so referred to pulmonary clinic 05/13/2017 by Dr   Damon [name] s/p  [facility]T from [facility] around req peg/trach ? Around  2000?    05/13/2017 1st [facility] office visit/ Damon [name]   Chief Complaint  Patient presents with  . Pulmonary Consult    Referred by Dr. Betty [name].  Pt c/o non prod cough for the past year. He gets "strangled easy" when eating or drinking. He has an albuterol inhaler that he rarely uses.   mother says unequivocally chronic choking /strangling with food is better than it was at last swallowing eval 2007  Cough most nights p falls  asleep assoc with watery rhintis also worse first thing in am   No other  obvious day to day or daytime variability or assoc excess/ purulent sputum or mucus plugs or hemoptysis or cp or chest tightness, subjective wheeze or overt  hb symptoms. No unusual exposure hx or h/o childhood pna/ asthma or knowledge of premature birth.   Also denies any obvious fluctuation of symptoms with weather or environmental changes or other aggravating or alleviating factors except as outlined above   Current Allergies, Complete Past Medical History, Past Surgical History, Family History, and Social History were reviewed in [employer] record.  ROS  The following are not active complaints unless bolded Hoarseness, sore throat, dysphagia, dental problems, itching, sneezing,  nasal congestion or discharge of watery mucus or purulent secretions, ear ache,   fever, chills, sweats, unintended wt loss or wt gain, classically pleuritic or exertional cp,  orthopnea pnd or leg swelling, presyncope, palpitations, abdominal pain, anorexia, nausea, vomiting, diarrhea  or change in bowel habits or change in  bladder habits, change in stools or change in urine, dysuria, hematuria,  rash, arthralgias, visual complaints, headache, numbness, weakness or ataxia or problems with walking or coordination,  change in mood/affect or memory.           Current Meds  Medication Sig  . albuterol (PROVENTIL HFA;VENTOLIN HFA) 108 (90 Base) MCG/ACT inhaler Inhale 2 puffs into the lungs every 8 (eight) hours as needed for wheezing or shortness of breath.  . Cholecalciferol ([employer] VIT D 5000 HIGH-POTENCY PO) Take by mouth.  Marland Kitchen FLUoxetine (PROZAC) 10 MG tablet Take 1 tablet (10 mg total) by mouth daily.  . hydrocortisone (ANUSOL-HC) 2.5 % rectal cream Apply rectally 2 times daily  . loratadine (CLARITIN) 10 MG tablet Take 10 mg by mouth daily.  . montelukast (SINGULAIR) 10 MG tablet take 1 tablet by mouth at bedtime  . omeprazole (PRILOSEC) 20 MG capsule Take 1 capsule (20 mg total) by mouth 2 (two) times daily before a meal.  . phenytoin (DILANTIN) 100 MG ER capsule 2 tabs twice daily  . SYNTHROID 75 MCG tablet Take 1 tablet (75 mcg total) by mouth daily.         Review of Systems     Objective:   Physical Exam    Pleasant deaf wm nad holding tissue drying up nose   Wt Readings from Last 3 Encounters:  05/13/17 148 lb (67.1 kg)  04/11/17 148 lb 4 oz (67.2 kg)  07/18/16 149 lb 8 oz (67.8 kg)     Vital  signs reviewed - Note on arrival 02 sats  100% on RA     HEENT: nl dentition,  and oropharynx. Nl external ear canals without cough reflex  - moderate bilateral non-specific turbinate edema with watery secretions    NECK :  without JVD/Nodes/TM/ nl carotid upstrokes bilaterally   LUNGS: no acc muscle use,  Mild pectus contour chest which is clear to A and P bilaterally without cough on insp or exp maneuvers   CV:  RRR  no s3 or murmur or increase in P2, and no edema   ABD:  soft and nontender with nl inspiratory excursion in the supine position. No bruits or organomegaly appreciated, bowel  sounds nl  MS:  Nl gait/ ext warm without deformities, calf tenderness, cyanosis or clubbing No obvious joint restrictions   SKIN: warm and dry without lesions    NEURO:  alert, approp, nl sensorium with  no motor or cerebellar deficits apparent.     CXR PA and Lateral:   05/13/2017 :    I personally reviewed images and agree with radiology impression as follows:   wnl    Labs ordered 05/13/2017  Allergy profile     Assessment:

## 2017-05-13 NOTE — Patient Instructions (Signed)
Change prilosec to 40 mg  X 30 min before bfast and add pepcid 20 mg at bedtime and stop clariton and singulair   For drainage / throat tickle try take CHLORPHENIRAMINE  4 mg - take one every 4 hours as needed - available over the counter- may cause drowsiness so start with just a bedtime dose or two and see how you tolerate it before trying in daytime     GERD (REFLUX)  is an extremely common cause of respiratory symptoms just like yours , many times with no obvious heartburn at all.    It can be treated with medication, but also with lifestyle changes including elevation of the head of your bed (ideally with 6 inch  bed blocks),  Smoking cessation, avoidance of late meals, excessive alcohol, and avoid fatty foods, chocolate, peppermint, colas, red wine, and acidic juices such as orange juice.  NO MINT OR MENTHOL PRODUCTS SO NO COUGH DROPS   USE SUGARLESS CANDY INSTEAD (Jolley ranchers or Stover's or Life Savers) or even ice chips will also do - the key is to swallow to prevent all throat clearing. NO OIL BASED VITAMINS - use powdered substitutes.    Please remember to go to the lab and x-ray department downstairs in the basement  for your tests - we will call you with the results when they are available.     Please schedule a follow up office visit in 6 weeks, call sooner if needed

## 2017-05-13 NOTE — Assessment & Plan Note (Signed)
Onset winter of 2018 assoc with watery rhinitis - Allergy profile 05/13/2017 >  Eos 0. /  IgE  Pending    The most common causes of chronic cough in immunocompetent adults include the following: upper airway cough syndrome (UACS), previously referred to as postnasal drip syndrome (PNDS), which is caused by variety of rhinosinus conditions; (2) asthma; (3) GERD; (4) chronic bronchitis from cigarette smoking or other inhaled environmental irritants; (5) nonasthmatic eosinophilic bronchitis; and (6) bronchiectasis.   These conditions, singly or in combination, have accounted for up to 94% of the causes of chronic cough in prospective studies.   Other conditions have constituted no >6% of the causes in prospective studies These have included bronchogenic carcinoma, chronic interstitial pneumonia, sarcoidosis, left ventricular failure, ACEI-induced cough, and aspiration from a condition associated with pharyngeal dysfunction.    Chronic cough is often simultaneously caused by more than one condition. A single cause has been found from 38 to 82% of the time, multiple causes from 18 to 62%. Multiply caused cough has been the result of three diseases up to 42% of the time.       Most likely this is Upper airway cough syndrome (previously labeled PNDS),  is so named because it's frequently impossible to sort out how much is  CR/sinusitis with freq throat clearing (which can be related to primary GERD)   vs  causing  secondary (" extra esophageal")  GERD from wide swings in gastric pressure that occur with throat clearing, often  promoting self use of mint and menthol lozenges that reduce the lower esophageal sphincter tone and exacerbate the problem further in a cyclical fashion.   These are the same pts (now being labeled as having "irritable larynx syndrome" by some cough centers) who not infrequently have a history of having failed to tolerate ace inhibitors,  dry powder inhalers or biphosphonates or report  having atypical/extraesophageal reflux symptoms that don't respond to standard doses of PPI  and are easily confused as having aecopd or asthma flares by even experienced allergists/ pulmonologists (myself included).    While awaiting allergy profile rec trial of max rx for gerd with 1st gen H1 blockers per guidelines and stop singulair / clariton as not helpful to date.   If not better next step is swallowing eval and sinus CT     Total time devoted to counseling  > 50 % of initial 60 min office visit:  review case with pt/mother discussion of options/alternatives/ personally creating written customized instructions  in presence of pt  then going over those specific  Instructions directly with the pt including how to use all of the meds but in particular covering each new medication in detail and the difference between the maintenance= "automatic" meds and the prns using an action plan format for the latter (If this problem/symptom => do that organization reading Left to right).  Please see AVS from this visit for a full list of these instructions which I personally wrote for this pt and  are unique to this visit.

## 2017-05-13 NOTE — Progress Notes (Signed)
LMTCB for [name], pt's mother

## 2017-05-14 LAB — RESPIRATORY ALLERGY PROFILE REGION II ~~LOC~~
ALLERGEN, D PTERNOYSSINUS, D1: 0.18 kU/L — AB
Allergen, Cedar tree, t12: <0.1 kU/L
Allergen, Cottonwood, t14: <0.1 kU/L
Allergen, Mouse Urine Protein, e78: <0.1 kU/L
Allergen, Oak,t7: <0.1 kU/L
Bermuda Grass: <0.1 kU/L
Box Elder IgE: <0.1 kU/L
CLADOSPORIUM HERBARUM (M2) IGE: <0.1 kU/L
CLASS: 0
CLASS: 0
CLASS: 0
CLASS: 0
CLASS: 0
CLASS: 0
CLASS: 0
CLASS: 0
CLASS: 0
CLASS: 0
CLASS: 0
CLASS: 0
COMMON RAGWEED (SHORT) (W1) IGE: <0.1 kU/L
Cat Dander: <0.1 kU/L
Class: 0
Class: 0
Class: 0
Class: 0
Class: 0
Class: 0
Class: 0
Class: 0
Class: 0
Class: 0
Class: 0
Class: 1
Cockroach: 0.62 kU/L — ABNORMAL HIGH
D. FARINAE: 0.15 kU/L — AB
Dog Dander: <0.1 kU/L
Elm IgE: <0.1 kU/L
IGE (IMMUNOGLOBULIN E), SERUM: 82 kU/L (ref <=114)
Pecan/Hickory Tree IgE: <0.1 kU/L
Sheep Sorrel IgE: <0.1 kU/L
Timothy Grass: <0.1 kU/L

## 2017-05-14 LAB — INTERPRETATION:

## 2017-05-15 NOTE — Progress Notes (Signed)
Spoke with pt and notified of results per Dr. Wert. Pt verbalized understanding and denied any questions. 

## 2017-06-14 ENCOUNTER — Other Ambulatory Visit: Payer: Self-pay | Admitting: Internal Medicine

## 2017-06-27 ENCOUNTER — Encounter: Payer: Self-pay | Admitting: Internal Medicine

## 2017-06-27 ENCOUNTER — Other Ambulatory Visit (HOSPITAL_COMMUNITY): Payer: Self-pay | Admitting: Internal Medicine

## 2017-06-27 ENCOUNTER — Ambulatory Visit: Payer: Medicare HMO | Admitting: Internal Medicine

## 2017-06-27 VITALS — BP 140/82 | HR 79 | Ht 67.0 in | Wt 140.0 lb

## 2017-06-27 DIAGNOSIS — R1310 Dysphagia, unspecified: Secondary | ICD-10-CM

## 2017-06-27 DIAGNOSIS — R059 Cough, unspecified: Secondary | ICD-10-CM

## 2017-06-27 DIAGNOSIS — R05 Cough: Secondary | ICD-10-CM | POA: Diagnosis not present

## 2017-06-27 DIAGNOSIS — R058 Other specified cough: Secondary | ICD-10-CM

## 2017-06-27 NOTE — Progress Notes (Signed)
Subjective:     Patient ID: Damon [name]., male   DOB: [birth year],    MRN: [id number]     Brief patient profile:  51yowm  never smoker born deaf with new onset cough daily since winter 2018 with allergy pills including singulair  and inhaler reflux rx  no better so referred to pulmonary clinic 05/13/2017 by Dr   Betty Martinique s/p  [facility]T [facility] around  ? Around  2000? req peg/trach out p 12 m only    History of Present Illness  05/13/2017 1st [facility] office visit/ Wert   Chief Complaint  Patient presents with  . Pulmonary Consult    Referred by Dr. Betty Martinique.  Pt c/o non prod cough for the past year. He gets "strangled easy" when eating or drinking. He has an albuterol inhaler that he rarely uses.   mother says unequivocally chronic choking /strangling with food is better than it was at last swallowing eval 2007  Cough most nights p falls  asleep assoc with watery rhintis also worse first thing in am  rec Change prilosec to 40 mg  X 30 min before bfast and add pepcid 20 mg at bedtime and stop clariton and singulair  For drainage / throat tickle try take CHLORPHENIRAMINE  4 mg - take one every 4 hours as needed - available over the counter- may cause drowsiness so start with just a bedtime dose or two and see how you tolerate it before trying in daytime   GERD diet       06/27/2017  f/u ov/Wert re: cough x > 1 y assoc with pnds / noct cough better but not daytime Chief Complaint  Patient presents with  . Follow-up    cough is the same not any better still getting strangled   Dyspnea:  Limited by balance not breathing  Cough: the worst after stirring / noct cough some better p just one h1 hs - has not tried in daytime (misunderstood instructions Sleep: ok  SABA use:  None/ doesn't help      No obvious day to day or daytime variability or assoc excess/ purulent sputum or mucus plugs or hemoptysis or cp or chest tightness, subjective wheeze or overt sinus or hb symptoms.  No unusual exposure hx or h/o childhood pna/ asthma or knowledge of premature birth.  Sleeping  Ok   without nocturnal  or early am exacerbation  of respiratory  c/o's or need for noct saba. Also denies any obvious fluctuation of symptoms with weather or environmental changes or other aggravating or alleviating factors except as outlined above   Current Allergies, Complete Past Medical History, Past Surgical History, Family History, and Social History were reviewed in [employer] record.  ROS  The following are not active complaints unless bolded Hoarseness, sore throat, dysphagia, dental problems, itching, sneezing,  nasal congestion or discharge of excess mucus or purulent secretions, ear ache,   fever, chills, sweats, unintended wt loss or wt gain, classically pleuritic or exertional cp,  orthopnea pnd or arm/hand swelling  or leg swelling, presyncope, palpitations, abdominal pain, anorexia, nausea, vomiting, diarrhea  or change in bowel habits or change in bladder habits, change in stools or change in urine, dysuria, hematuria,  rash, arthralgias, visual complaints, headache, numbness, weakness or ataxia or problems with walking or coordination,  change in mood or  memory.        Current Meds  Medication Sig  . Cholecalciferol (CVS VIT D 5000  HIGH-POTENCY PO) Take by mouth.  . famotidine (PEPCID) 20 MG tablet One at bedtime  . FLUoxetine (PROZAC) 10 MG tablet Take 1 tablet (10 mg total) by mouth daily.  . hydrocortisone (ANUSOL-HC) 2.5 % rectal cream Apply rectally 2 times daily  . omeprazole (PRILOSEC) 40 MG capsule TAKE 1 CAPSULE BY MOUTH ONCE DAILY  . phenytoin (DILANTIN) 100 MG ER capsule 2 tabs twice daily  . SYNTHROID 75 MCG tablet Take 1 tablet (75 mcg total) by mouth daily.               Objective:   Physical Exam     amb wm nad     06/29/2017      140   05/13/17 148 lb (67.1 kg)  04/11/17 148 lb 4 oz (67.2 kg)  07/18/16 149 lb 8 oz (67.8 kg)       Vital signs reviewed - Note on arrival 02 sats  99% on RA     HEENT: nl dentition,  and oropharynx. Nl external ear canals without cough reflex - moderately severe  bilateral non-specific turbinate edema     NECK :  without JVD/Nodes/TM/ nl carotid upstrokes bilaterally   LUNGS: no acc muscle use,  Nl contour chest which is clear to A and P bilaterally without cough on insp or exp maneuvers   CV:  RRR  no s3 or murmur or increase in P2, and no edema   ABD:  soft and nontender with nl inspiratory excursion in the supine position. No bruits or organomegaly appreciated, bowel sounds nl  MS:  Nl gait/ ext warm without deformities, calf tenderness, cyanosis or clubbing No obvious joint restrictions   SKIN: warm and dry without lesions    NEURO:  alert, approp, nl sensorium with  no motor or cerebellar deficits apparent.                  Assessment:

## 2017-06-27 NOTE — Patient Instructions (Signed)
Please see patient coordinator before you leave today  to schedule sinus Ct  And Speech therapy eval for modified barium swallow    For drainage / throat tickle try take CHLORPHENIRAMINE  4 mg - take one-two  every 4 hours as needed - available over the counter- may cause drowsiness so start with just a bedtime dose or two and see how you tolerate it before trying in daytime     Pulmonary follow up will be arranged if indicated

## 2017-06-29 ENCOUNTER — Encounter: Payer: Self-pay | Admitting: Internal Medicine

## 2017-06-29 NOTE — Assessment & Plan Note (Signed)
Onset winter of 2018 assoc with watery rhinitis - Allergy profile 05/13/2017 >  Eos 0.0 /  IgE  82  RAST pos only cockroach dust  - noct cough improved on 1st gen h1 06/27/2017  - Sinus CT 06/29/2017 >>> - ST eval ordered   Still strongly favor Upper airway cough syndrome (previously labeled PNDS),  is so named because it's frequently impossible to sort out how much is  CR/sinusitis with freq throat clearing (which can be related to primary GERD)   vs  causing  secondary (" extra esophageal")  GERD from wide swings in gastric pressure that occur with throat clearing, often  promoting self use of mint and menthol lozenges that reduce the lower esophageal sphincter tone and exacerbate the problem further in a cyclical fashion.   These are the same pts (now being labeled as having "irritable larynx syndrome" by some cough centers) who not infrequently have a history of having failed to tolerate ace inhibitors,  dry powder inhalers or biphosphonates or report having atypical/extraesophageal reflux symptoms that don't respond to standard doses of PPI  and are easily confused as having aecopd or asthma flares by even experienced allergists/ pulmonologists (myself included).  Next steps as above, in meantime continue 1st gen H1 blockers per guidelines    I had an extended discussion with the patient and his mother reviewing all relevant studies completed to date and  lasting 15 to 20 minutes of a 25 minute visit    Each maintenance medication was reviewed in detail including most importantly the difference between maintenance and prns and under what circumstances the prns are to be triggered using an action plan format that is not reflected in the computer generated alphabetically organized AVS.    Please see AVS for specific instructions unique to this visit that I personally wrote and verbalized to the the pt in detail and then reviewed with pt  by my nurse highlighting any  changes in therapy recommended  at today's visit to their plan of care.

## 2017-07-16 ENCOUNTER — Ambulatory Visit (HOSPITAL_COMMUNITY)
Admission: RE | Admit: 2017-07-16 | Discharge: 2017-07-16 | Disposition: A | Discharge status: Home / Self Care | Payer: Medicare HMO | Source: Ambulatory Visit | Attending: Internal Medicine | Admitting: Internal Medicine

## 2017-07-16 ENCOUNTER — Ambulatory Visit (INDEPENDENT_AMBULATORY_CARE_PROVIDER_SITE_OTHER)
Admission: RE | Admit: 2017-07-16 | Discharge: 2017-07-16 | Disposition: A | Discharge status: Home / Self Care | Payer: Medicare HMO | Source: Ambulatory Visit | Attending: Internal Medicine | Admitting: Internal Medicine

## 2017-07-16 DIAGNOSIS — R059 Cough, unspecified: Secondary | ICD-10-CM

## 2017-07-16 DIAGNOSIS — R05 Cough: Secondary | ICD-10-CM | POA: Diagnosis not present

## 2017-07-16 DIAGNOSIS — R058 Other specified cough: Secondary | ICD-10-CM

## 2017-07-16 DIAGNOSIS — R1310 Dysphagia, unspecified: Secondary | ICD-10-CM | POA: Insufficient documentation

## 2017-07-16 DIAGNOSIS — R1312 Dysphagia, oropharyngeal phase: Secondary | ICD-10-CM | POA: Diagnosis not present

## 2017-07-16 NOTE — Progress Notes (Signed)
Modified Barium Swallow Progress Note  Patient Details  Name: Damon [name]. MRN: [id number] Date of Birth: January 28, 1966  Today's Date: 07/16/2017  Modified Barium Swallow completed.  Full report located under Chart Review in the Imaging Section.  Brief recommendations include the following:  Clinical Impression  Pt has a mild oropharyngeal dysphagia with concern for primary esophageal deficits. He has a prolonged oral phase, also characterized by piecemeal swallowing and premature spillage. He has brief oral holding, although this appears to be a learned behavior that was taught to him by his previous SLP after his accident (his mother has been cueing him to "hold, then swallow"). He has reduced pharyngeal clearance particularly with thin liquids and purees, requiring multiple spontaneous swallows to clear each bolus. Minimal residue is noted with more solid textures, and he appears to achieve good pharyngeal squeeze. He also has intermittent backflow from his esophagus into his pharynx. Despite the above, pt has good airway protection across consistencies. Recommend that he continue with regular textures and thin liquids as tolerated, using general aspiration and esophageal precautions. Pt may benefit from esophageal w/u.   Swallow Evaluation Recommendations   Recommended Consults: Consider GI evaluation;Consider esophageal assessment   SLP Diet Recommendations: Regular solids;Thin liquid   Liquid Administration via: Cup;Straw   Medication Administration: Whole meds with puree   Supervision: Patient able to self feed   Compensations: Minimize environmental distractions;Slow rate;Small sips/bites;Multiple dry swallows after each bite/sip;Follow solids with liquid   Postural Changes: Seated upright at 90 degrees;Remain semi-upright after after feeds/meals (Comment)   Oral Care Recommendations: Oral care BID        Germain Osgood 07/16/2017,3:10 PM   Germain Osgood, M.A.  CCC-SLP [phone]

## 2017-09-02 DIAGNOSIS — H40013 Open angle with borderline findings, low risk, bilateral: Secondary | ICD-10-CM | POA: Diagnosis not present

## 2017-09-02 DIAGNOSIS — H2513 Age-related nuclear cataract, bilateral: Secondary | ICD-10-CM | POA: Diagnosis not present

## 2017-09-07 ENCOUNTER — Other Ambulatory Visit: Payer: Self-pay

## 2017-09-07 ENCOUNTER — Encounter (HOSPITAL_COMMUNITY): Payer: Self-pay | Admitting: *Deleted

## 2017-09-07 ENCOUNTER — Emergency Department (HOSPITAL_COMMUNITY)
Admission: EM | Admit: 2017-09-07 | Discharge: 2017-09-07 | Disposition: A | Discharge status: Home / Self Care | Payer: Medicare HMO | Attending: Emergency Medicine | Admitting: Emergency Medicine

## 2017-09-07 DIAGNOSIS — L0211 Cutaneous abscess of neck: Secondary | ICD-10-CM | POA: Insufficient documentation

## 2017-09-07 DIAGNOSIS — Z79899 Other long term (current) drug therapy: Secondary | ICD-10-CM | POA: Diagnosis not present

## 2017-09-07 DIAGNOSIS — R221 Localized swelling, mass and lump, neck: Secondary | ICD-10-CM | POA: Diagnosis present

## 2017-09-07 MED ORDER — SULFAMETHOXAZOLE-TRIMETHOPRIM 800-160 MG PO TABS: 2.0000 | ORAL_TABLET | Freq: Two times a day (BID) | ORAL | 0 refills | Status: DC

## 2017-09-07 MED ORDER — LIDOCAINE HCL 2 % IJ SOLN
20.0000 mL | Freq: Once | INTRAMUSCULAR | Status: AC
  Administered 2017-09-07: 400 mg via INTRADERMAL
  Filled 2017-09-07: qty 20

## 2017-09-07 NOTE — ED Triage Notes (Signed)
Pt arrives with his mother (pt is deaf). Reports he had a small area on his right shoulder that she was able to squeeze some drainage out of on Friday. Today, he started shaking and felt warm to touch, she gave him ibuprofen PTA for the same. Report pain, redness and some swelling to the right posterior shoulder.

## 2017-09-07 NOTE — Discharge Instructions (Addendum)
Return here as needed.  Follow-up with your doctor for recheck in the next few days.  Use warm compresses around the area.
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
## 2017-09-10 ENCOUNTER — Ambulatory Visit (INDEPENDENT_AMBULATORY_CARE_PROVIDER_SITE_OTHER): Payer: Medicare HMO | Admitting: Family Medicine

## 2017-09-10 ENCOUNTER — Encounter: Payer: Self-pay | Admitting: Family Medicine

## 2017-09-10 VITALS — BP 130/82 | HR 54 | Temp 97.8°F | Resp 12 | Ht 67.0 in | Wt 145.0 lb

## 2017-09-10 DIAGNOSIS — L0211 Cutaneous abscess of neck: Secondary | ICD-10-CM

## 2017-09-10 NOTE — Patient Instructions (Signed)
A few things to remember from today's visit:   Abscess, neck  Continue antibiotic treatment. Keep it clean with soap and water. Pull packing a little bit every 2-3 days. Monitor for fever.   Please be sure medication list is accurate. If a new problem present, please set up appointment sooner than planned today.

## 2017-09-11 ENCOUNTER — Encounter: Payer: Self-pay | Admitting: Family Medicine

## 2017-09-15 ENCOUNTER — Encounter: Payer: Self-pay | Admitting: Family Medicine

## 2017-09-15 ENCOUNTER — Ambulatory Visit (INDEPENDENT_AMBULATORY_CARE_PROVIDER_SITE_OTHER): Payer: Medicare HMO | Admitting: Family Medicine

## 2017-09-15 VITALS — BP 124/80 | HR 76 | Temp 97.7°F | Resp 12 | Ht 67.0 in | Wt 143.4 lb

## 2017-09-15 DIAGNOSIS — L298 Other pruritus: Secondary | ICD-10-CM | POA: Diagnosis not present

## 2017-09-15 DIAGNOSIS — L0211 Cutaneous abscess of neck: Secondary | ICD-10-CM | POA: Diagnosis not present

## 2017-09-15 MED ORDER — DOXYCYCLINE HYCLATE 100 MG PO TABS: 100.0000 mg | ORAL_TABLET | Freq: Two times a day (BID) | ORAL | 0 refills | Status: DC
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
## 2017-09-15 NOTE — Patient Instructions (Addendum)
A few things to remember from today's visit:   Neck abscess - Plan: doxycycline (VIBRA-TABS) 100 MG tablet  Pruritic erythematous rash  Over-the-counter hydrocortisone cream 2-3 times per day for up to 14 days. Over-the-counter itchy medications like Sarna my also help. Avoid Benadryl. Continue wound care at home. I will see her back in 1 week.  Please be sure medication list is accurate. If a new problem present, please set up appointment sooner than planned today.

## 2017-09-19 ENCOUNTER — Ambulatory Visit (INDEPENDENT_AMBULATORY_CARE_PROVIDER_SITE_OTHER): Payer: Medicare HMO | Admitting: Family Medicine

## 2017-09-19 ENCOUNTER — Encounter: Payer: Self-pay | Admitting: Family Medicine

## 2017-09-19 VITALS — BP 120/80 | HR 78 | Temp 97.8°F | Resp 12 | Ht 67.0 in | Wt 143.0 lb

## 2017-09-19 DIAGNOSIS — L298 Other pruritus: Secondary | ICD-10-CM | POA: Diagnosis not present

## 2017-09-19 DIAGNOSIS — L0211 Cutaneous abscess of neck: Secondary | ICD-10-CM

## 2017-09-19 MED ORDER — TRIAMCINOLONE ACETONIDE 0.1 % EX CREA: 1.0000 "application " | TOPICAL_CREAM | Freq: Two times a day (BID) | CUTANEOUS | 0 refills | Status: AC
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
## 2017-09-19 NOTE — Patient Instructions (Addendum)
A few things to remember from today's visit:   Pruritic erythematous rash - Plan: triamcinolone cream (KENALOG) 0.1 %  Abscess of neck   Please be sure medication list is accurate. If a new problem present, please set up appointment sooner than planned today.

## 2017-09-29 ENCOUNTER — Encounter: Payer: Self-pay | Admitting: Family Medicine

## 2017-09-29 ENCOUNTER — Ambulatory Visit (INDEPENDENT_AMBULATORY_CARE_PROVIDER_SITE_OTHER): Payer: Medicare HMO | Admitting: Family Medicine

## 2017-09-29 VITALS — BP 126/78 | HR 68 | Temp 97.9°F | Resp 12 | Ht 67.0 in | Wt 147.0 lb

## 2017-09-29 DIAGNOSIS — R1310 Dysphagia, unspecified: Secondary | ICD-10-CM | POA: Diagnosis not present

## 2017-09-29 DIAGNOSIS — E038 Other specified hypothyroidism: Secondary | ICD-10-CM

## 2017-09-29 DIAGNOSIS — R002 Palpitations: Secondary | ICD-10-CM | POA: Diagnosis not present

## 2017-09-29 DIAGNOSIS — L0211 Cutaneous abscess of neck: Secondary | ICD-10-CM

## 2017-09-29 DIAGNOSIS — R05 Cough: Secondary | ICD-10-CM

## 2017-09-29 DIAGNOSIS — R053 Chronic cough: Secondary | ICD-10-CM

## 2017-09-29 DIAGNOSIS — K219 Gastro-esophageal reflux disease without esophagitis: Secondary | ICD-10-CM | POA: Diagnosis not present

## 2017-09-29 MED ORDER — PANTOPRAZOLE SODIUM 40 MG PO TBEC: 40.0000 mg | DELAYED_RELEASE_TABLET | Freq: Every day | ORAL | 3 refills | Status: DC

## 2017-09-29 NOTE — Assessment & Plan Note (Signed)
This is a mildly abnormal when last checked. She did not come to have TSH rechecked.  For now he will continue Synthroid 75 mcg daily.  Recommend bring him back in 2 to 3 weeks for lab work.

## 2017-09-29 NOTE — Patient Instructions (Signed)
A few things to remember from today's visit:   Dysphagia, unspecified type - Plan: Ambulatory referral to Gastroenterology  Gastroesophageal reflux disease, esophagitis presence not specified - Plan: pantoprazole (PROTONIX) 40 MG tablet  Chronic cough  Neck abscess  Continue monitoring neck abscess, follow-up as needed. Protonix daily, 30 minutes before breakfast. Referral with current GI will be arranged.   GERD:  Avoid foods that make your symptoms worse, for example coffee, chocolate,pepermeint,alcohol, and greasy food. Raising the head of your bed about 6 inches may help with nocturnal symptoms.  Avoid tobacco use. Weight loss (if you are overweight). Avoid lying down for 3 hours after eating.  Instead 3 large meals daily try small and more frequent meals during the day.   You should be evaluated immediately if bloody vomiting, bloody stools, black stools (like tar), difficulty swallowing, food gets stuck on the way down or choking when eating. Abnormal weight loss or severe abdominal pain.  If symptoms are not resolved sometimes endoscopy is necessary.   Please be sure medication list is accurate. If a new problem present, please set up appointment sooner than planned today.
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
## 2017-10-13 ENCOUNTER — Encounter: Payer: Self-pay | Admitting: Family Medicine

## 2017-10-13 ENCOUNTER — Ambulatory Visit (INDEPENDENT_AMBULATORY_CARE_PROVIDER_SITE_OTHER): Payer: Medicare HMO | Admitting: Family Medicine

## 2017-10-13 VITALS — BP 124/80 | HR 75 | Temp 97.9°F | Resp 12 | Ht 67.0 in | Wt 146.1 lb

## 2017-10-13 DIAGNOSIS — E038 Other specified hypothyroidism: Secondary | ICD-10-CM

## 2017-10-13 DIAGNOSIS — F411 Generalized anxiety disorder: Secondary | ICD-10-CM

## 2017-10-13 DIAGNOSIS — R002 Palpitations: Secondary | ICD-10-CM | POA: Insufficient documentation

## 2017-10-13 LAB — TSH: TSH: 3.85 u[IU]/mL (ref 0.35–4.50)

## 2017-10-13 MED ORDER — FLUOXETINE HCL 20 MG PO CAPS: 20.0000 mg | ORAL_CAPSULE | Freq: Every day | ORAL | 1 refills | Status: DC

## 2017-10-13 NOTE — Patient Instructions (Signed)
A few things to remember from today's visit:   Heart palpitations - Plan: EKG 12-Lead  Generalized anxiety disorder - Plan: FLUoxetine (PROZAC) 20 MG capsule  Other specified hypothyroidism - Plan: TSH  Fluoxetine increased to 20 mg. Rest of medication unchanged.  Please be sure medication list is accurate. If a new problem present, please set up appointment sooner than planned today.
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
## 2017-10-16 ENCOUNTER — Encounter: Payer: Self-pay | Admitting: Family Medicine

## 2017-11-12 ENCOUNTER — Ambulatory Visit: Payer: Medicare HMO | Admitting: Internal Medicine

## 2017-11-12 ENCOUNTER — Encounter (INDEPENDENT_AMBULATORY_CARE_PROVIDER_SITE_OTHER): Payer: Self-pay

## 2017-11-12 ENCOUNTER — Encounter: Payer: Self-pay | Admitting: Internal Medicine

## 2017-11-12 VITALS — BP 120/80 | HR 84 | Ht 67.0 in | Wt 144.0 lb

## 2017-11-12 DIAGNOSIS — R1310 Dysphagia, unspecified: Secondary | ICD-10-CM

## 2017-11-12 DIAGNOSIS — R1319 Other dysphagia: Secondary | ICD-10-CM

## 2017-11-12 DIAGNOSIS — Z86010 Personal history of colonic polyps: Secondary | ICD-10-CM | POA: Diagnosis not present

## 2017-11-12 DIAGNOSIS — K219 Gastro-esophageal reflux disease without esophagitis: Secondary | ICD-10-CM

## 2017-11-12 MED ORDER — NA SULFATE-K SULFATE-MG SULF 17.5-3.13-1.6 GM/177ML PO SOLN: 1.0000 | Freq: Once | ORAL | 0 refills | Status: AC

## 2017-11-12 NOTE — Patient Instructions (Signed)
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
## 2017-12-03 ENCOUNTER — Encounter: Payer: Self-pay | Admitting: Internal Medicine

## 2017-12-17 ENCOUNTER — Ambulatory Visit (AMBULATORY_SURGERY_CENTER): Payer: Medicare HMO | Admitting: Internal Medicine

## 2017-12-17 ENCOUNTER — Encounter: Payer: Self-pay | Admitting: Internal Medicine

## 2017-12-17 VITALS — BP 137/86 | HR 73 | Temp 98.4°F | Resp 13 | Ht 67.0 in | Wt 144.0 lb

## 2017-12-17 DIAGNOSIS — D123 Benign neoplasm of transverse colon: Secondary | ICD-10-CM | POA: Diagnosis not present

## 2017-12-17 DIAGNOSIS — Z86010 Personal history of colonic polyps: Secondary | ICD-10-CM | POA: Diagnosis not present

## 2017-12-17 DIAGNOSIS — K222 Esophageal obstruction: Secondary | ICD-10-CM

## 2017-12-17 DIAGNOSIS — R1319 Other dysphagia: Secondary | ICD-10-CM

## 2017-12-17 DIAGNOSIS — K219 Gastro-esophageal reflux disease without esophagitis: Secondary | ICD-10-CM

## 2017-12-17 DIAGNOSIS — R1310 Dysphagia, unspecified: Secondary | ICD-10-CM | POA: Diagnosis not present

## 2017-12-17 DIAGNOSIS — Z1211 Encounter for screening for malignant neoplasm of colon: Secondary | ICD-10-CM | POA: Diagnosis not present

## 2017-12-17 MED ORDER — SODIUM CHLORIDE 0.9 % IV SOLN: 500.0000 mL | Freq: Once | INTRAVENOUS | Status: DC

## 2017-12-17 NOTE — Progress Notes (Signed)
Called to room to assist during endoscopic procedure.  Patient ID and intended procedure confirmed with present staff. Received instructions for my participation in the procedure from the performing physician.  

## 2017-12-17 NOTE — Patient Instructions (Signed)
YOU HAD AN ENDOSCOPIC PROCEDURE TODAY AT [facility]:   Refer to the procedure report that was given to you for any specific questions about what was found during the examination.  If the procedure report does not answer your questions, please call your gastroenterologist to clarify.  If you requested that your care partner not be given the details of your procedure findings, then the procedure report has been included in a sealed envelope for you to review at your convenience later.  YOU SHOULD EXPECT: Some feelings of bloating in the abdomen. Passage of more gas than usual.  Walking can help get rid of the air that was put into your GI tract during the procedure and reduce the bloating. If you had a lower endoscopy (such as a colonoscopy or flexible sigmoidoscopy) you may notice spotting of blood in your stool or on the toilet paper. If you underwent a bowel prep for your procedure, you may not have a normal bowel movement for a few days.  Please Note:  You might notice some irritation and congestion in your nose or some drainage.  This is from the oxygen used during your procedure.  There is no need for concern and it should clear up in a day or so.  SYMPTOMS TO REPORT IMMEDIATELY:   Following lower endoscopy (colonoscopy or flexible sigmoidoscopy):  Excessive amounts of blood in the stool  Significant tenderness or worsening of abdominal pains  Swelling of the abdomen that is new, acute  Fever of 100F or higher   Following upper endoscopy (EGD)  Vomiting of blood or coffee ground material  New chest pain or pain under the shoulder blades  Painful or persistently difficult swallowing  New shortness of breath  Fever of 100F or higher  Black, tarry-looking stools  For urgent or emergent issues, a gastroenterologist can be reached at any hour by calling [phone].   DIET:  We do recommend a small meal at first, but then you may proceed to your regular diet.  Drink  plenty of fluids but you should avoid alcoholic beverages for 24 hours.  ACTIVITY:  You should plan to take it easy for the rest of today and you should NOT DRIVE or use heavy machinery until tomorrow (because of the sedation medicines used during the test).    FOLLOW UP: Our staff will call the number listed on your records the next business day following your procedure to check on you and address any questions or concerns that you may have regarding the information given to you following your procedure. If we do not reach you, we will leave a message.  However, if you are feeling well and you are not experiencing any problems, there is no need to return our call.  We will assume that you have returned to your regular daily activities without incident.  If any biopsies were taken you will be contacted by phone or by letter within the next 1-3 weeks.  Please call us at [phone] if you have not heard about the biopsies in 3 weeks.    SIGNATURES/CONFIDENTIALITY: You and/or your care partner have signed paperwork which will be entered into your electronic medical record.  These signatures attest to the fact that that the information above on your After Visit Summary has been reviewed and is understood.  Full responsibility of the confidentiality of this discharge information lies with you and/or your care-partner.  Post dilation diet given. Continue pantoprazole  Polyp and hemorrhoid information  given.

## 2017-12-17 NOTE — Op Note (Signed)
[place] Patient Name: Damon [name] Procedure Date: 12/17/2017 2:49 PM MRN: [id number] Endoscopist: Docia Chuck. Henrene Pastor , MD Age: 52 Referring MD:  Date of Birth: 05-02-1965 Gender: Male Account #: [id number] Procedure:                Upper GI endoscopy with [facility] dilation of the                            esophagus?"36 [place] Indications:              Dysphagia, Suspected esophageal reflux, Chronic                            cough Medicines:                Monitored Anesthesia Care Procedure:                Pre-Anesthesia Assessment:                           - Prior to the procedure, a History and Physical                            was performed, and patient medications and                            allergies were reviewed. The patient's tolerance of                            previous anesthesia was also reviewed. The risks                            and benefits of the procedure and the sedation                            options and risks were discussed with the patient.                            All questions were answered, and informed consent                            was obtained. Prior Anticoagulants: The patient has                            taken no previous anticoagulant or antiplatelet                            agents. ASA Grade Assessment: II - A patient with                            mild systemic disease. After reviewing the risks                            and benefits, the patient was deemed in  satisfactory condition to undergo the procedure.                           After obtaining informed consent, the endoscope was                            passed under direct vision. Throughout the                            procedure, the patient's blood pressure, pulse, and                            oxygen saturations were monitored continuously. The                            Model GIF-HQ190 [id number]) scope was introduced                           through the mouth, and advanced to the second part                            of duodenum. The upper GI endoscopy was                            accomplished without difficulty. The patient                            tolerated the procedure well. Scope In: Scope Out: Findings:                 One benign-appearing, intrinsic moderate stenosis                            was found 35 cm from the incisors. This stenosis                            measured 1.4 cm (inner diameter). The scope was                            withdrawn after completing the endoscopic survey.                            Dilation was performed with a Maloney dilator with                            no resistance at 52 Fr. No heme. Tolerated well.                           The exam of the esophagus was otherwise normal.                           The stomach was normal save a 3 cm sliding hiatal  hernia.                           The examined duodenum was normal.                           The cardia and gastric fundus were normal on                            retroflexion. Complications:            No immediate complications. Estimated Blood Loss:     Estimated blood loss: none. Impression:               - Benign-appearing esophageal stenosis. Dilated.                           - Normal stomach.                           - Normal examined duodenum.                           - No specimens collected. Recommendation:           - Patient has a contact number available for                            emergencies. The signs and symptoms of potential                            delayed complications were discussed with the                            patient. Return to normal activities tomorrow.                            Written discharge instructions were provided to the                            patient.                           - Post dilation diet.                            - Continue present medications. Continue                            pantoprazole 40 mg daily                           -Return to the care of your primary provider. GI                            follow-up as needed. Docia Chuck. Henrene Pastor, MD 12/17/2017 3:25:59 PM This report has been signed electronically.

## 2017-12-17 NOTE — Op Note (Signed)
[place] Patient Name: Damon [name] Procedure Date: 12/17/2017 2:50 PM MRN: [id number] Endoscopist: Docia Chuck. Henrene Pastor , MD Age: 52 Referring MD:  Date of Birth: [birth year] Gender: Male Account #: [id number] Procedure:                Colonoscopy with cold snare polypectomy Indications:              High risk colon cancer surveillance: Personal                            history of adenoma (10 mm or greater in size), High                            risk colon cancer surveillance: Personal history of                            multiple (3 or more) adenomas. Index exam December                            2016 Medicines:                Monitored Anesthesia Care Procedure:                Pre-Anesthesia Assessment:                           - Prior to the procedure, a History and Physical                            was performed, and patient medications and                            allergies were reviewed. The patient's tolerance of                            previous anesthesia was also reviewed. The risks                            and benefits of the procedure and the sedation                            options and risks were discussed with the patient.                            All questions were answered, and informed consent                            was obtained. Prior Anticoagulants: The patient has                            taken no previous anticoagulant or antiplatelet                            agents. After reviewing the risks and benefits, the  patient was deemed in satisfactory condition to                            undergo the procedure.                           After obtaining informed consent, the colonoscope                            was passed under direct vision. Throughout the                            procedure, the patient's blood pressure, pulse, and                            oxygen saturations were monitored  continuously. The                            Model CF-HQ190L [id number]) scope was introduced                            through the anus and advanced to the the cecum,                            identified by appendiceal orifice and ileocecal                            valve. The ileocecal valve, appendiceal orifice,                            and rectum were photographed. The quality of the                            bowel preparation was excellent. The colonoscopy                            was performed without difficulty. The patient                            tolerated the procedure well. The bowel preparation                            used was SUPREP. Scope In: 2:58:54 PM Scope Out: 3:12:42 PM Scope Withdrawal Time: 0 hours 11 minutes 38 seconds  Total Procedure Duration: 0 hours 13 minutes 48 seconds  Findings:                 A 10 mm polyp was found in the transverse colon.                            The polyp was semi-pedunculated. The polyp was                            removed with a cold snare. Resection and retrieval  were complete.                           Internal hemorrhoids were found during retroflexion.                           The exam was otherwise without abnormality on                            direct views. Retroflexion was purposely not                            achieved secondary to narrow rectal vault but an                            excellent view from the anal os was obtained and                            photographed. Complications:            No immediate complications. Estimated blood loss:                            None. Estimated Blood Loss:     Estimated blood loss: none. Impression:               - One 10 mm polyp in the transverse colon, removed                            with a cold snare. Resected and retrieved.                           - Internal hemorrhoids.                           - The examination was  otherwise normal Recommendation:           - Repeat colonoscopy in 3 years for surveillance.                           - Patient has a contact number available for                            emergencies. The signs and symptoms of potential                            delayed complications were discussed with the                            patient. Return to normal activities tomorrow.                            Written discharge instructions were provided to the                            patient.                           -  Resume previous diet.                           - Continue present medications.                           - Await pathology results. Docia Chuck. Henrene Pastor, MD 12/17/2017 3:17:52 PM This report has been signed electronically.

## 2017-12-17 NOTE — Progress Notes (Signed)
A and O x3. Report to RN. Tolerated MAC anesthesia well.

## 2017-12-17 NOTE — Progress Notes (Signed)
Pt. Not able to speak mother at bedside and assisted with admission.

## 2017-12-18 ENCOUNTER — Telehealth: Payer: Self-pay

## 2017-12-18 NOTE — Telephone Encounter (Signed)
  Follow up Call-  Call back number 12/17/2017  Post procedure Call Back phone  # [phone]  Permission to leave phone message Yes  Some recent data might be hidden     Patient questions:  Do you have a fever, pain , or abdominal swelling? No. Pain Score  0 *  Have you tolerated food without any problems? Yes.    Have you been able to return to your normal activities? Yes.    Do you have any questions about your discharge instructions: Diet   No. Medications  No. Follow up visit  No.  Do you have questions or concerns about your Care? No.  Actions: * If pain score is 4 or above: No action needed, pain <4.

## 2017-12-23 ENCOUNTER — Encounter: Payer: Self-pay | Admitting: Internal Medicine

## 2018-01-13 ENCOUNTER — Ambulatory Visit (INDEPENDENT_AMBULATORY_CARE_PROVIDER_SITE_OTHER): Payer: Medicare HMO | Admitting: Family Medicine

## 2018-01-13 ENCOUNTER — Encounter: Payer: Self-pay | Admitting: Family Medicine

## 2018-01-13 VITALS — BP 124/83 | HR 77 | Temp 97.6°F | Resp 12 | Ht 67.0 in | Wt 144.5 lb

## 2018-01-13 DIAGNOSIS — R002 Palpitations: Secondary | ICD-10-CM

## 2018-01-13 DIAGNOSIS — K219 Gastro-esophageal reflux disease without esophagitis: Secondary | ICD-10-CM | POA: Diagnosis not present

## 2018-01-13 DIAGNOSIS — R05 Cough: Secondary | ICD-10-CM

## 2018-01-13 DIAGNOSIS — R053 Chronic cough: Secondary | ICD-10-CM

## 2018-01-13 DIAGNOSIS — F411 Generalized anxiety disorder: Secondary | ICD-10-CM

## 2018-01-13 MED ORDER — PANTOPRAZOLE SODIUM 20 MG PO TBEC: 20.0000 mg | DELAYED_RELEASE_TABLET | Freq: Two times a day (BID) | ORAL | 1 refills | Status: DC

## 2018-01-13 NOTE — Assessment & Plan Note (Signed)
>>  ASSESSMENT AND PLAN FOR GERD (GASTROESOPHAGEAL REFLUX DISEASE) WRITTEN ON 01/13/2018  8:36 AM BY [place], Timoteo Expose, MD  Dysphasia resolved. We discussed the importance of GERD precautions and avoiding identified factors that aggravate symptoms. We will try Protonix 20 mg twice daily instead 40 mg daily. Follow-up in 5 to 6 months.
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
## 2018-01-13 NOTE — Assessment & Plan Note (Signed)
Dysphasia resolved. We discussed the importance of GERD precautions and avoiding identified factors that aggravate symptoms. We will try Protonix 20 mg twice daily instead 40 mg daily. Follow-up in 5 to 6 months.

## 2018-01-13 NOTE — Assessment & Plan Note (Signed)
Problem seems to be related to anxiety, resolved after fluoxetine was adjusted. Instructed about warning signs. Follow-up as needed.

## 2018-01-13 NOTE — Patient Instructions (Addendum)
A few things to remember from today's visit:   Generalized anxiety disorder  Gastroesophageal reflux disease, esophagitis presence not specified - Plan: pantoprazole (PROTONIX) 20 MG tablet  Heart palpitations  Chronic cough Decrease amount of soda per day and continue GERD precautions. We changed Protonix from 40 mg daily to 20 mg twice daily.  Please be sure medication list is accurate. If a new problem present, please set up appointment sooner than planned today.

## 2018-01-13 NOTE — Assessment & Plan Note (Signed)
Improved with fluoxetine 20 mg daily. We discussed some side effects of medications. Follow-up in 5 to 6 months.

## 2018-01-17 ENCOUNTER — Encounter: Payer: Self-pay | Admitting: Family Medicine

## 2018-02-16 ENCOUNTER — Ambulatory Visit (INDEPENDENT_AMBULATORY_CARE_PROVIDER_SITE_OTHER): Payer: Medicare HMO | Admitting: Family Medicine

## 2018-02-16 ENCOUNTER — Encounter: Payer: Self-pay | Admitting: Family Medicine

## 2018-02-16 VITALS — BP 131/83 | HR 80 | Ht 67.0 in | Wt 149.0 lb

## 2018-02-16 DIAGNOSIS — Z7689 Persons encountering health services in other specified circumstances: Secondary | ICD-10-CM | POA: Diagnosis not present

## 2018-02-16 DIAGNOSIS — R05 Cough: Secondary | ICD-10-CM | POA: Diagnosis not present

## 2018-02-16 DIAGNOSIS — R053 Chronic cough: Secondary | ICD-10-CM

## 2018-02-16 DIAGNOSIS — K219 Gastro-esophageal reflux disease without esophagitis: Secondary | ICD-10-CM | POA: Insufficient documentation

## 2018-02-16 DIAGNOSIS — F809 Developmental disorder of speech and language, unspecified: Secondary | ICD-10-CM | POA: Diagnosis not present

## 2018-02-16 DIAGNOSIS — E038 Other specified hypothyroidism: Secondary | ICD-10-CM

## 2018-02-16 DIAGNOSIS — F411 Generalized anxiety disorder: Secondary | ICD-10-CM | POA: Diagnosis not present

## 2018-02-16 DIAGNOSIS — R1310 Dysphagia, unspecified: Secondary | ICD-10-CM

## 2018-02-16 DIAGNOSIS — E559 Vitamin D deficiency, unspecified: Secondary | ICD-10-CM

## 2018-02-16 DIAGNOSIS — Z8719 Personal history of other diseases of the digestive system: Secondary | ICD-10-CM | POA: Insufficient documentation

## 2018-02-16 DIAGNOSIS — E785 Hyperlipidemia, unspecified: Secondary | ICD-10-CM

## 2018-02-16 DIAGNOSIS — E049 Nontoxic goiter, unspecified: Secondary | ICD-10-CM

## 2018-02-16 DIAGNOSIS — Z9889 Other specified postprocedural states: Secondary | ICD-10-CM

## 2018-02-16 NOTE — Patient Instructions (Addendum)
-  The AHA strongly endorses consumption of a diet that contains a variety of foods from all the food categories with an emphasis on fruits and vegetables; fat-free and low-fat dairy products; cereal and grain products; legumes and nuts; and fish, poultry, and/or extra lean meats.    Excessive food intake, especially of foods high in saturated and trans fats, sugar, and salt, should be avoided.    Adequate water intake of roughly 1/2 of your weight in pounds, should equal the ounces of water per day you should drink.  So for instance, if you're 200 pounds, that would be 100 ounces of water per day.         Mediterranean Diet  Why follow it? Research shows. . Those who follow the Mediterranean diet have a reduced risk of heart disease  . The diet is associated with a reduced incidence of Parkinson's and Alzheimer's diseases . People following the diet may have longer life expectancies and lower rates of chronic diseases  . The Dietary Guidelines for Americans recommends the Mediterranean diet as an eating plan to promote health and prevent disease  What Is the Mediterranean Diet?  . Healthy eating plan based on typical foods and recipes of Mediterranean-style cooking . The diet is primarily a plant based diet; these foods should make up a majority of meals   Starches - Plant based foods should make up a majority of meals - They are an important sources of vitamins, minerals, energy, antioxidants, and fiber - Choose whole grains, foods high in fiber and minimally processed items  - Typical grain sources include wheat, oats, barley, corn, brown rice, bulgar, farro, millet, polenta, couscous  - Various types of beans include chickpeas, lentils, fava beans, black beans, white beans   Fruits  Veggies - Large quantities of antioxidant rich fruits & veggies; 6 or more servings  - Vegetables can be eaten raw or lightly drizzled with oil and cooked  - Vegetables common to the traditional  Mediterranean Diet include: artichokes, arugula, beets, broccoli, brussel sprouts, cabbage, carrots, celery, collard greens, cucumbers, eggplant, kale, leeks, lemons, lettuce, mushrooms, okra, onions, peas, peppers, potatoes, pumpkin, radishes, rutabaga, shallots, spinach, sweet potatoes, turnips, zucchini - Fruits common to the Mediterranean Diet include: apples, apricots, avocados, cherries, clementines, dates, figs, grapefruits, grapes, melons, nectarines, oranges, peaches, pears, pomegranates, strawberries, tangerines  Fats - Replace butter and margarine with healthy oils, such as olive oil, canola oil, and tahini  - Limit nuts to no more than a handful a day  - Nuts include walnuts, almonds, pecans, pistachios, pine nuts  - Limit or avoid candied, honey roasted or heavily salted nuts - Olives are central to the [employer] - can be eaten whole or used in a variety of dishes   Meats Protein - Limiting red meat: no more than a few times a month - When eating red meat: choose lean cuts and keep the portion to the size of deck of cards - Eggs: approx. 0 to 4 times a week  - Fish and lean poultry: at least 2 a week  - Healthy protein sources include, chicken, [place], lean beef, lamb - Increase intake of seafood such as tuna, salmon, trout, mackerel, shrimp, scallops - Avoid or limit high fat processed meats such as sausage and bacon  Dairy - Include moderate amounts of low fat dairy products  - Focus on healthy dairy such as fat free yogurt, skim milk, low or reduced fat cheese - Limit dairy products  higher in fat such as whole or 2% milk, cheese, ice cream  Alcohol - Moderate amounts of red wine is ok  - No more than 5 oz daily for women (all ages) and men older than age 74  - No more than 10 oz of wine daily for men younger than 57  Other - Limit sweets and other desserts  - Use herbs and spices instead of salt to flavor foods  - Herbs and spices common to the traditional Mediterranean  Diet include: basil, bay leaves, chives, cloves, cumin, fennel, garlic, lavender, marjoram, mint, oregano, parsley, pepper, rosemary, sage, savory, sumac, tarragon, thyme   It's not just a diet, it's a lifestyle:  . The Mediterranean diet includes lifestyle factors typical of those in the region  . Foods, drinks and meals are best eaten with others and savored . Daily physical activity is important for overall good health . This could be strenuous exercise like running and aerobics . This could also be more leisurely activities such as walking, housework, yard-work, or taking the stairs . Moderation is the key; a balanced and healthy diet accommodates most foods and drinks . Consider portion sizes and frequency of consumption of certain foods   Meal Ideas & Options:  . Breakfast:  o Whole wheat toast or whole wheat English muffins with peanut butter & hard boiled egg o Steel cut oats topped with apples & cinnamon and skim milk  o Fresh fruit: banana, strawberries, melon, berries, peaches  o Smoothies: strawberries, bananas, greek yogurt, peanut butter o Low fat greek yogurt with blueberries and granola  o Egg white omelet with spinach and mushrooms o Breakfast couscous: whole wheat couscous, apricots, skim milk, cranberries  . Sandwiches:  o Hummus and grilled vegetables (peppers, zucchini, squash) on whole wheat bread   o Grilled chicken on whole wheat pita with lettuce, tomatoes, cucumbers or tzatziki  o Tuna salad on whole wheat bread: tuna salad made with greek yogurt, olives, red peppers, capers, green onions o Garlic rosemary lamb pita: lamb sauted with garlic, rosemary, salt & pepper; add lettuce, cucumber, greek yogurt to pita - flavor with lemon juice and black pepper  . Seafood:  o Mediterranean grilled salmon, seasoned with garlic, basil, parsley, lemon juice and black pepper o Shrimp, lemon, and spinach whole-grain pasta salad made with low fat greek yogurt  o Seared scallops  with lemon orzo  o Seared tuna steaks seasoned salt, pepper, coriander topped with tomato mixture of olives, tomatoes, olive oil, minced garlic, parsley, green onions and cappers  . Meats:  o Herbed greek chicken salad with kalamata olives, cucumber, feta  o Red bell peppers stuffed with spinach, bulgur, lean ground beef (or lentils) & topped with feta   o Kebabs: skewers of chicken, tomatoes, onions, zucchini, squash  o [place] burgers: made with red onions, mint, dill, lemon juice, feta cheese topped with roasted red peppers . Vegetarian o Cucumber salad: cucumbers, artichoke hearts, celery, red onion, feta cheese, tossed in olive oil & lemon juice  o Hummus and whole grain pita points with a greek salad (lettuce, tomato, feta, olives, cucumbers, red onion) o Lentil soup with celery, carrots made with vegetable broth, garlic, salt and pepper  o Tabouli salad: parsley, bulgur, mint, scallions, cucumbers, tomato, radishes, lemon juice, olive oil, salt and pepper.
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
## 2018-02-26 ENCOUNTER — Other Ambulatory Visit: Payer: Self-pay

## 2018-02-26 ENCOUNTER — Inpatient Hospital Stay (HOSPITAL_COMMUNITY)
Admission: EM | Admit: 2018-02-26 | Discharge: 2018-03-10 | DRG: 472 | Disposition: A | Discharge status: Home / Self Care | Payer: Medicare HMO | Attending: Family Medicine | Admitting: Family Medicine

## 2018-02-26 ENCOUNTER — Emergency Department (HOSPITAL_COMMUNITY): Payer: Medicare HMO

## 2018-02-26 DIAGNOSIS — Z8371 Family history of colonic polyps: Secondary | ICD-10-CM | POA: Diagnosis not present

## 2018-02-26 DIAGNOSIS — Z8349 Family history of other endocrine, nutritional and metabolic diseases: Secondary | ICD-10-CM | POA: Diagnosis not present

## 2018-02-26 DIAGNOSIS — R251 Tremor, unspecified: Secondary | ICD-10-CM

## 2018-02-26 DIAGNOSIS — M50022 Cervical disc disorder at C5-C6 level with myelopathy: Secondary | ICD-10-CM | POA: Diagnosis present

## 2018-02-26 DIAGNOSIS — Z8719 Personal history of other diseases of the digestive system: Secondary | ICD-10-CM

## 2018-02-26 DIAGNOSIS — I6523 Occlusion and stenosis of bilateral carotid arteries: Secondary | ICD-10-CM | POA: Diagnosis not present

## 2018-02-26 DIAGNOSIS — E039 Hypothyroidism, unspecified: Secondary | ICD-10-CM | POA: Diagnosis present

## 2018-02-26 DIAGNOSIS — E440 Moderate protein-calorie malnutrition: Secondary | ICD-10-CM | POA: Diagnosis present

## 2018-02-26 DIAGNOSIS — M4802 Spinal stenosis, cervical region: Secondary | ICD-10-CM | POA: Diagnosis present

## 2018-02-26 DIAGNOSIS — F419 Anxiety disorder, unspecified: Secondary | ICD-10-CM | POA: Diagnosis present

## 2018-02-26 DIAGNOSIS — Z6820 Body mass index (BMI) 20.0-20.9, adult: Secondary | ICD-10-CM | POA: Diagnosis not present

## 2018-02-26 DIAGNOSIS — R2689 Other abnormalities of gait and mobility: Secondary | ICD-10-CM | POA: Diagnosis present

## 2018-02-26 DIAGNOSIS — K5900 Constipation, unspecified: Secondary | ICD-10-CM | POA: Diagnosis not present

## 2018-02-26 DIAGNOSIS — F444 Conversion disorder with motor symptom or deficit: Secondary | ICD-10-CM

## 2018-02-26 DIAGNOSIS — K219 Gastro-esophageal reflux disease without esophagitis: Secondary | ICD-10-CM | POA: Diagnosis present

## 2018-02-26 DIAGNOSIS — R252 Cramp and spasm: Secondary | ICD-10-CM | POA: Diagnosis not present

## 2018-02-26 DIAGNOSIS — R011 Cardiac murmur, unspecified: Secondary | ICD-10-CM | POA: Diagnosis present

## 2018-02-26 DIAGNOSIS — J45909 Unspecified asthma, uncomplicated: Secondary | ICD-10-CM | POA: Diagnosis present

## 2018-02-26 DIAGNOSIS — S069X9A Unspecified intracranial injury with loss of consciousness of unspecified duration, initial encounter: Secondary | ICD-10-CM

## 2018-02-26 DIAGNOSIS — E538 Deficiency of other specified B group vitamins: Secondary | ICD-10-CM | POA: Diagnosis present

## 2018-02-26 DIAGNOSIS — F809 Developmental disorder of speech and language, unspecified: Secondary | ICD-10-CM

## 2018-02-26 DIAGNOSIS — R29898 Other symptoms and signs involving the musculoskeletal system: Secondary | ICD-10-CM | POA: Diagnosis present

## 2018-02-26 DIAGNOSIS — Z8249 Family history of ischemic heart disease and other diseases of the circulatory system: Secondary | ICD-10-CM | POA: Diagnosis not present

## 2018-02-26 DIAGNOSIS — S069XAA Unspecified intracranial injury with loss of consciousness status unknown, initial encounter: Secondary | ICD-10-CM

## 2018-02-26 DIAGNOSIS — G40109 Localization-related (focal) (partial) symptomatic epilepsy and epileptic syndromes with simple partial seizures, not intractable, without status epilepticus: Secondary | ICD-10-CM

## 2018-02-26 DIAGNOSIS — R42 Dizziness and giddiness: Secondary | ICD-10-CM | POA: Diagnosis not present

## 2018-02-26 DIAGNOSIS — G40909 Epilepsy, unspecified, not intractable, without status epilepticus: Secondary | ICD-10-CM | POA: Diagnosis present

## 2018-02-26 DIAGNOSIS — G459 Transient cerebral ischemic attack, unspecified: Secondary | ICD-10-CM | POA: Diagnosis not present

## 2018-02-26 DIAGNOSIS — E559 Vitamin D deficiency, unspecified: Secondary | ICD-10-CM | POA: Diagnosis present

## 2018-02-26 DIAGNOSIS — R05 Cough: Secondary | ICD-10-CM

## 2018-02-26 DIAGNOSIS — M4804 Spinal stenosis, thoracic region: Secondary | ICD-10-CM | POA: Diagnosis not present

## 2018-02-26 DIAGNOSIS — G992 Myelopathy in diseases classified elsewhere: Secondary | ICD-10-CM | POA: Diagnosis not present

## 2018-02-26 DIAGNOSIS — H918X3 Other specified hearing loss, bilateral: Secondary | ICD-10-CM | POA: Diagnosis present

## 2018-02-26 DIAGNOSIS — F32A Depression, unspecified: Secondary | ICD-10-CM | POA: Diagnosis present

## 2018-02-26 DIAGNOSIS — G930 Cerebral cysts: Secondary | ICD-10-CM | POA: Diagnosis not present

## 2018-02-26 DIAGNOSIS — Z881 Allergy status to other antibiotic agents status: Secondary | ICD-10-CM

## 2018-02-26 DIAGNOSIS — Z87820 Personal history of traumatic brain injury: Secondary | ICD-10-CM | POA: Diagnosis not present

## 2018-02-26 DIAGNOSIS — E785 Hyperlipidemia, unspecified: Secondary | ICD-10-CM | POA: Diagnosis present

## 2018-02-26 DIAGNOSIS — M4322 Fusion of spine, cervical region: Secondary | ICD-10-CM | POA: Diagnosis not present

## 2018-02-26 DIAGNOSIS — M5124 Other intervertebral disc displacement, thoracic region: Secondary | ICD-10-CM | POA: Diagnosis not present

## 2018-02-26 DIAGNOSIS — Z79890 Hormone replacement therapy (postmenopausal): Secondary | ICD-10-CM

## 2018-02-26 DIAGNOSIS — F329 Major depressive disorder, single episode, unspecified: Secondary | ICD-10-CM | POA: Diagnosis present

## 2018-02-26 DIAGNOSIS — R270 Ataxia, unspecified: Secondary | ICD-10-CM

## 2018-02-26 DIAGNOSIS — R1310 Dysphagia, unspecified: Secondary | ICD-10-CM | POA: Diagnosis present

## 2018-02-26 DIAGNOSIS — R55 Syncope and collapse: Secondary | ICD-10-CM | POA: Diagnosis not present

## 2018-02-26 DIAGNOSIS — Z833 Family history of diabetes mellitus: Secondary | ICD-10-CM

## 2018-02-26 DIAGNOSIS — G959 Disease of spinal cord, unspecified: Secondary | ICD-10-CM | POA: Diagnosis not present

## 2018-02-26 DIAGNOSIS — G542 Cervical root disorders, not elsewhere classified: Secondary | ICD-10-CM | POA: Diagnosis not present

## 2018-02-26 DIAGNOSIS — R059 Cough, unspecified: Secondary | ICD-10-CM

## 2018-02-26 DIAGNOSIS — J9811 Atelectasis: Secondary | ICD-10-CM | POA: Diagnosis not present

## 2018-02-26 DIAGNOSIS — R292 Abnormal reflex: Secondary | ICD-10-CM | POA: Diagnosis not present

## 2018-02-26 DIAGNOSIS — S069X0S Unspecified intracranial injury without loss of consciousness, sequela: Secondary | ICD-10-CM | POA: Diagnosis not present

## 2018-02-26 DIAGNOSIS — I63412 Cerebral infarction due to embolism of left middle cerebral artery: Secondary | ICD-10-CM | POA: Diagnosis not present

## 2018-02-26 DIAGNOSIS — R531 Weakness: Secondary | ICD-10-CM | POA: Diagnosis present

## 2018-02-26 DIAGNOSIS — Z419 Encounter for procedure for purposes other than remedying health state, unspecified: Secondary | ICD-10-CM

## 2018-02-26 LAB — COMPREHENSIVE METABOLIC PANEL
ALT: 22 U/L (ref 0–44)
AST: 27 U/L (ref 15–41)
Albumin: 3.7 g/dL (ref 3.5–5.0)
Alkaline Phosphatase: 67 U/L (ref 38–126)
Anion gap: 6 (ref 5–15)
BUN: 7 mg/dL (ref 6–20)
CALCIUM: 8.6 mg/dL — AB (ref 8.9–10.3)
CO2: 30 mmol/L (ref 22–32)
Chloride: 102 mmol/L (ref 98–111)
Creatinine, Ser: 1.07 mg/dL (ref 0.61–1.24)
GFR calc Af Amer: >60 mL/min (ref >60)
GFR calc non Af Amer: >60 mL/min (ref >60)
Glucose, Bld: 76 mg/dL (ref 70–99)
Potassium: 3.7 mmol/L (ref 3.5–5.1)
SODIUM: 138 mmol/L (ref 135–145)
Total Bilirubin: 0.5 mg/dL (ref 0.3–1.2)
Total Protein: 6.7 g/dL (ref 6.5–8.1)

## 2018-02-26 LAB — DIFFERENTIAL
Abs Immature Granulocytes: 0.02 10*3/uL (ref 0.00–0.07)
BASOS ABS: 0 10*3/uL (ref 0.0–0.1)
Basophils Relative: 1 %
Eosinophils Absolute: 0.1 10*3/uL (ref 0.0–0.5)
Eosinophils Relative: 2 %
Immature Granulocytes: 0 %
LYMPHS PCT: 19 %
Lymphs Abs: 1.1 10*3/uL (ref 0.7–4.0)
Monocytes Absolute: 0.4 10*3/uL (ref 0.1–1.0)
Monocytes Relative: 8 %
Neutro Abs: 4 10*3/uL (ref 1.7–7.7)
Neutrophils Relative %: 70 %

## 2018-02-26 LAB — RAPID URINE DRUG SCREEN, HOSP PERFORMED
Amphetamines: NOT DETECTED
Barbiturates: NOT DETECTED
Benzodiazepines: NOT DETECTED
Cocaine: NOT DETECTED
Opiates: NOT DETECTED
Tetrahydrocannabinol: NOT DETECTED

## 2018-02-26 LAB — PROTIME-INR
INR: 1.06
Prothrombin Time: 13.7 seconds (ref 11.4–15.2)

## 2018-02-26 LAB — URINALYSIS, ROUTINE W REFLEX MICROSCOPIC
Bilirubin Urine: NEGATIVE
Glucose, UA: NEGATIVE mg/dL
Hgb urine dipstick: NEGATIVE
Ketones, ur: NEGATIVE mg/dL
LEUKOCYTES UA: NEGATIVE
Nitrite: NEGATIVE
Protein, ur: NEGATIVE mg/dL
Specific Gravity, Urine: 1.003 — ABNORMAL LOW (ref 1.005–1.030)
pH: 6 (ref 5.0–8.0)

## 2018-02-26 LAB — CBC
HEMATOCRIT: 45 % (ref 39.0–52.0)
Hemoglobin: 14.3 g/dL (ref 13.0–17.0)
MCH: 29.5 pg (ref 26.0–34.0)
MCHC: 31.8 g/dL (ref 30.0–36.0)
MCV: 93 fL (ref 80.0–100.0)
Platelets: 193 10*3/uL (ref 150–400)
RBC: 4.84 MIL/uL (ref 4.22–5.81)
RDW: 12.7 % (ref 11.5–15.5)
WBC: 5.7 10*3/uL (ref 4.0–10.5)
nRBC: 0 % (ref 0.0–0.2)

## 2018-02-26 LAB — TROPONIN I: Troponin I: <0.03 ng/mL (ref <0.03)

## 2018-02-26 LAB — ETHANOL: Alcohol, Ethyl (B): <10 mg/dL (ref <10)

## 2018-02-26 LAB — APTT: aPTT: 34 seconds (ref 24–36)

## 2018-02-26 MED ORDER — FLUOXETINE HCL 20 MG PO CAPS
20.0000 mg | ORAL_CAPSULE | Freq: Every day | ORAL | Status: DC
  Administered 2018-02-26 – 2018-03-04 (×6): 20 mg via ORAL
  Filled 2018-02-26 (×6): qty 1

## 2018-02-26 MED ORDER — ACETAMINOPHEN 325 MG PO TABS
650.0000 mg | ORAL_TABLET | ORAL | Status: DC | PRN
  Administered 2018-02-28: 650 mg via ORAL
  Filled 2018-02-26: qty 2

## 2018-02-26 MED ORDER — PHENYTOIN SODIUM EXTENDED 100 MG PO CAPS
100.0000 mg | ORAL_CAPSULE | ORAL | Status: DC
  Administered 2018-02-27 – 2018-03-03 (×3): 100 mg via ORAL
  Filled 2018-02-26 (×5): qty 1

## 2018-02-26 MED ORDER — ACETAMINOPHEN 160 MG/5ML PO SOLN: 650.0000 mg | ORAL | Status: DC | PRN

## 2018-02-26 MED ORDER — PHENYTOIN SODIUM EXTENDED 100 MG PO CAPS
200.0000 mg | ORAL_CAPSULE | ORAL | Status: DC
  Administered 2018-02-26 – 2018-03-02 (×3): 200 mg via ORAL
  Filled 2018-02-26 (×5): qty 2

## 2018-02-26 MED ORDER — IOPAMIDOL (ISOVUE-370) INJECTION 76%
100.0000 mL | Freq: Once | INTRAVENOUS | Status: AC | PRN
  Administered 2018-02-26: 100 mL via INTRAVENOUS

## 2018-02-26 MED ORDER — FAMOTIDINE 20 MG PO TABS
20.0000 mg | ORAL_TABLET | Freq: Every day | ORAL | Status: DC
  Administered 2018-02-26 – 2018-03-03 (×6): 20 mg via ORAL
  Filled 2018-02-26 (×8): qty 1

## 2018-02-26 MED ORDER — LORAZEPAM 2 MG/ML IJ SOLN: 1.0000 mg | INTRAMUSCULAR | Status: DC | PRN

## 2018-02-26 MED ORDER — ALBUTEROL SULFATE (2.5 MG/3ML) 0.083% IN NEBU: 2.5000 mg | INHALATION_SOLUTION | Freq: Three times a day (TID) | RESPIRATORY_TRACT | Status: DC | PRN

## 2018-02-26 MED ORDER — ENOXAPARIN SODIUM 40 MG/0.4ML ~~LOC~~ SOLN
40.0000 mg | SUBCUTANEOUS | Status: DC
  Administered 2018-02-27 – 2018-03-10 (×10): 40 mg via SUBCUTANEOUS
  Filled 2018-02-26 (×11): qty 0.4

## 2018-02-26 MED ORDER — ASPIRIN 300 MG RE SUPP
300.0000 mg | Freq: Every day | RECTAL | Status: DC
  Administered 2018-03-05: 300 mg via RECTAL
  Filled 2018-02-26: qty 1

## 2018-02-26 MED ORDER — PHENYTOIN SODIUM EXTENDED 100 MG PO CAPS: 100.0000 mg | ORAL_CAPSULE | ORAL | Status: DC

## 2018-02-26 MED ORDER — ACETAMINOPHEN 650 MG RE SUPP: 650.0000 mg | RECTAL | Status: DC | PRN

## 2018-02-26 MED ORDER — STROKE: EARLY STAGES OF RECOVERY BOOK
Freq: Once | Status: AC
  Administered 2018-02-26: 23:00:00
  Filled 2018-02-26: qty 1

## 2018-02-26 MED ORDER — ASPIRIN 325 MG PO TABS
325.0000 mg | ORAL_TABLET | Freq: Every day | ORAL | Status: DC
  Administered 2018-02-27 – 2018-03-04 (×3): 325 mg via ORAL
  Filled 2018-02-26 (×5): qty 1

## 2018-02-26 MED ORDER — PHENYTOIN SODIUM EXTENDED 100 MG PO CAPS
200.0000 mg | ORAL_CAPSULE | Freq: Every day | ORAL | Status: DC
  Administered 2018-02-27 – 2018-03-04 (×5): 200 mg via ORAL
  Filled 2018-02-26 (×5): qty 2

## 2018-02-26 MED ORDER — PANTOPRAZOLE SODIUM 20 MG PO TBEC
20.0000 mg | DELAYED_RELEASE_TABLET | Freq: Two times a day (BID) | ORAL | Status: DC
  Administered 2018-02-27 – 2018-03-04 (×9): 20 mg via ORAL
  Filled 2018-02-26 (×11): qty 1

## 2018-02-26 MED ORDER — IOPAMIDOL (ISOVUE-370) INJECTION 76%
INTRAVENOUS | Status: AC
  Filled 2018-02-26: qty 100

## 2018-02-26 MED ORDER — SENNOSIDES-DOCUSATE SODIUM 8.6-50 MG PO TABS: 1.0000 | ORAL_TABLET | Freq: Every evening | ORAL | Status: DC | PRN

## 2018-02-26 MED ORDER — LORAZEPAM 2 MG/ML IJ SOLN
1.0000 mg | INTRAMUSCULAR | Status: DC | PRN
  Administered 2018-02-26: 1 mg via INTRAVENOUS
  Filled 2018-02-26: qty 1

## 2018-02-26 MED ORDER — LORAZEPAM 2 MG/ML IJ SOLN: 0.5000 mg | Freq: Three times a day (TID) | INTRAMUSCULAR | Status: DC | PRN

## 2018-02-26 MED ORDER — DM-GUAIFENESIN ER 30-600 MG PO TB12
1.0000 | ORAL_TABLET | Freq: Two times a day (BID) | ORAL | Status: DC | PRN
  Filled 2018-02-26: qty 1

## 2018-02-26 MED ORDER — LEVOTHYROXINE SODIUM 75 MCG PO TABS
75.0000 ug | ORAL_TABLET | Freq: Every day | ORAL | Status: DC
  Administered 2018-02-27 – 2018-03-04 (×4): 75 ug via ORAL
  Filled 2018-02-26 (×4): qty 1
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
## 2018-02-26 NOTE — ED Notes (Signed)
Patient transported to CT 

## 2018-02-26 NOTE — ED Triage Notes (Signed)
Patient c/o left leg weakness and dizziness x 1 day. Patient unstable with left leg while walking.
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
## 2018-02-26 NOTE — ED Notes (Signed)
Patient transported to MRI 
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
## 2018-02-26 NOTE — ED Notes (Signed)
Patient returned from MRI.

## 2018-02-27 ENCOUNTER — Inpatient Hospital Stay (HOSPITAL_COMMUNITY): Payer: Medicare HMO

## 2018-02-27 ENCOUNTER — Encounter (HOSPITAL_COMMUNITY): Payer: Self-pay

## 2018-02-27 ENCOUNTER — Ambulatory Visit (HOSPITAL_BASED_OUTPATIENT_CLINIC_OR_DEPARTMENT_OTHER): Payer: Medicare HMO

## 2018-02-27 ENCOUNTER — Observation Stay (HOSPITAL_COMMUNITY): Payer: Medicare HMO

## 2018-02-27 DIAGNOSIS — R55 Syncope and collapse: Secondary | ICD-10-CM | POA: Diagnosis not present

## 2018-02-27 DIAGNOSIS — Z6820 Body mass index (BMI) 20.0-20.9, adult: Secondary | ICD-10-CM | POA: Diagnosis not present

## 2018-02-27 DIAGNOSIS — Z8371 Family history of colonic polyps: Secondary | ICD-10-CM | POA: Diagnosis not present

## 2018-02-27 DIAGNOSIS — E785 Hyperlipidemia, unspecified: Secondary | ICD-10-CM | POA: Diagnosis present

## 2018-02-27 DIAGNOSIS — K5900 Constipation, unspecified: Secondary | ICD-10-CM | POA: Diagnosis not present

## 2018-02-27 DIAGNOSIS — E559 Vitamin D deficiency, unspecified: Secondary | ICD-10-CM | POA: Diagnosis present

## 2018-02-27 DIAGNOSIS — G40909 Epilepsy, unspecified, not intractable, without status epilepticus: Secondary | ICD-10-CM | POA: Diagnosis present

## 2018-02-27 DIAGNOSIS — M50022 Cervical disc disorder at C5-C6 level with myelopathy: Secondary | ICD-10-CM | POA: Diagnosis present

## 2018-02-27 DIAGNOSIS — Z79890 Hormone replacement therapy (postmenopausal): Secondary | ICD-10-CM | POA: Diagnosis not present

## 2018-02-27 DIAGNOSIS — E538 Deficiency of other specified B group vitamins: Secondary | ICD-10-CM | POA: Diagnosis present

## 2018-02-27 DIAGNOSIS — K219 Gastro-esophageal reflux disease without esophagitis: Secondary | ICD-10-CM | POA: Diagnosis present

## 2018-02-27 DIAGNOSIS — G459 Transient cerebral ischemic attack, unspecified: Secondary | ICD-10-CM | POA: Diagnosis not present

## 2018-02-27 DIAGNOSIS — Z833 Family history of diabetes mellitus: Secondary | ICD-10-CM | POA: Diagnosis not present

## 2018-02-27 DIAGNOSIS — R1310 Dysphagia, unspecified: Secondary | ICD-10-CM | POA: Diagnosis present

## 2018-02-27 DIAGNOSIS — Z8249 Family history of ischemic heart disease and other diseases of the circulatory system: Secondary | ICD-10-CM | POA: Diagnosis not present

## 2018-02-27 DIAGNOSIS — R251 Tremor, unspecified: Secondary | ICD-10-CM | POA: Diagnosis not present

## 2018-02-27 DIAGNOSIS — E440 Moderate protein-calorie malnutrition: Secondary | ICD-10-CM | POA: Diagnosis present

## 2018-02-27 DIAGNOSIS — H918X3 Other specified hearing loss, bilateral: Secondary | ICD-10-CM | POA: Diagnosis present

## 2018-02-27 DIAGNOSIS — R531 Weakness: Secondary | ICD-10-CM | POA: Diagnosis present

## 2018-02-27 DIAGNOSIS — Z8719 Personal history of other diseases of the digestive system: Secondary | ICD-10-CM | POA: Diagnosis not present

## 2018-02-27 DIAGNOSIS — Z881 Allergy status to other antibiotic agents status: Secondary | ICD-10-CM | POA: Diagnosis not present

## 2018-02-27 DIAGNOSIS — G542 Cervical root disorders, not elsewhere classified: Secondary | ICD-10-CM | POA: Diagnosis not present

## 2018-02-27 DIAGNOSIS — R29898 Other symptoms and signs involving the musculoskeletal system: Secondary | ICD-10-CM | POA: Diagnosis not present

## 2018-02-27 DIAGNOSIS — R2689 Other abnormalities of gait and mobility: Secondary | ICD-10-CM | POA: Diagnosis present

## 2018-02-27 DIAGNOSIS — Z87820 Personal history of traumatic brain injury: Secondary | ICD-10-CM | POA: Diagnosis not present

## 2018-02-27 DIAGNOSIS — R011 Cardiac murmur, unspecified: Secondary | ICD-10-CM | POA: Diagnosis present

## 2018-02-27 DIAGNOSIS — F329 Major depressive disorder, single episode, unspecified: Secondary | ICD-10-CM | POA: Diagnosis present

## 2018-02-27 DIAGNOSIS — Z8349 Family history of other endocrine, nutritional and metabolic diseases: Secondary | ICD-10-CM | POA: Diagnosis not present

## 2018-02-27 DIAGNOSIS — M4802 Spinal stenosis, cervical region: Secondary | ICD-10-CM | POA: Diagnosis present

## 2018-02-27 DIAGNOSIS — R270 Ataxia, unspecified: Secondary | ICD-10-CM | POA: Diagnosis not present

## 2018-02-27 DIAGNOSIS — E039 Hypothyroidism, unspecified: Secondary | ICD-10-CM | POA: Diagnosis present

## 2018-02-27 LAB — LIPID PANEL
Cholesterol: 214 mg/dL — ABNORMAL HIGH (ref 0–200)
HDL: 42 mg/dL (ref >40)
LDL CALC: 157 mg/dL — AB (ref 0–99)
TRIGLYCERIDES: 74 mg/dL (ref <150)
Total CHOL/HDL Ratio: 5.1 RATIO
VLDL: 15 mg/dL (ref 0–40)

## 2018-02-27 LAB — PHENYTOIN LEVEL, TOTAL: Phenytoin Lvl: 17.3 ug/mL (ref 10.0–20.0)

## 2018-02-27 LAB — ECHOCARDIOGRAM COMPLETE
Height: 70 in
Weight: 2324.53 oz

## 2018-02-27 LAB — VITAMIN B12: Vitamin B-12: 115 pg/mL — ABNORMAL LOW (ref 180–914)

## 2018-02-27 LAB — HEMOGLOBIN A1C
Hgb A1c MFr Bld: 4.4 % — ABNORMAL LOW (ref 4.8–5.6)
Mean Plasma Glucose: 79.58 mg/dL

## 2018-02-27 LAB — HIV ANTIBODY (ROUTINE TESTING W REFLEX): HIV SCREEN 4TH GENERATION: NONREACTIVE

## 2018-02-27 MED ORDER — THIAMINE HCL 100 MG/ML IJ SOLN
500.0000 mg | Freq: Every day | INTRAVENOUS | Status: DC
  Administered 2018-02-28 – 2018-03-08 (×8): 500 mg via INTRAVENOUS
  Filled 2018-02-27 (×10): qty 5

## 2018-02-27 MED ORDER — LORAZEPAM 2 MG/ML IJ SOLN
1.0000 mg | Freq: Once | INTRAMUSCULAR | Status: AC
  Administered 2018-02-27: 1 mg via INTRAVENOUS
  Filled 2018-02-27: qty 1

## 2018-02-27 NOTE — Procedures (Signed)
History: 52 yo M with h/o seizure  Sedation: none  Technique: This is a 21 channel routine scalp EEG performed at the bedside with bipolar and monopolar montages arranged in accordance to the international 10/20 system of electrode placement. One channel was dedicated to EKG recording.    Background: The background consists of intermixed alpha and beta activities. There is a well defined posterior dominant rhythm of 8-9 Hz that attenuates with eye opening. There is midline theta(ciganek) seen with awake/drowsy states. Sleep is not recorded.   Photic stimulation: Physiologic driving is not performed  EEG Abnormalities: None  Clinical Interpretation: This normal EEG is recorded in the waking and drowsy state. There was no seizure or seizure predisposition recorded on this study. Please note that lack of epileptiform activity on EEG does not preclude the possibility of epilepsy.   Roland Rack, MD [facility] [phone]  If 7pm- 7am, please page neurology on call as listed in [place].
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
## 2018-02-27 NOTE — Evaluation (Signed)
Occupational Therapy Evaluation Patient Details Name: Damon [name]. MRN: [id number] DOB: 22-Jun-1965 Today's Date: 02/27/2018    History of Present Illness Pt is a 52 y/o male with medical history significant of TBI, L temporal lobe encephalomalacia, depression, deafness, seizure, MVP, GERD, who presents with left leg weakness and poor balance. MRI, CTA, EEG negative.    Clinical Impression   PTA patient independent with self care, using cane for mobility.  Patient admitted for above and limited by impaired balance, L LE weakness, and decreased activity tolerance.  Patient demonstrates ability to complete self care, functional mobility and transfers with close supervision (initally requires min guard for transfers but improves with repetition).  Completes mobility with wide based stance and self corrects losses of balance throughout session without assistance.  Educated on safety and bathing seated in shower. Patient will have support of family at discharge.  Believe patient will benefit from continued OT services while admitted to optimize independence and safety with ADLs but anticipate no further needs after dc.  Will continue to follow.      Follow Up Recommendations  No OT follow up    Equipment Recommendations  3 in 1 bedside commode    Recommendations for Other Services       Precautions / Restrictions Precautions Precautions: Fall Precaution Comments: seizure Restrictions Weight Bearing Restrictions: No      Mobility Bed Mobility Overal bed mobility: Needs Assistance Bed Mobility: Supine to Sit;Sit to Supine     Supine to sit: Supervision Sit to supine: Supervision   General bed mobility comments: supervision for safety   Transfers Overall transfer level: Needs assistance Equipment used: Straight cane Transfers: Sit to/from Stand Sit to Stand: Min guard;Supervision         General transfer comment: min guard initally fading to supervision for safety,  improving with repititions      Balance Overall balance assessment: Needs assistance Sitting-balance support: No upper extremity supported;Feet supported Sitting balance-Leahy Scale: Good     Standing balance support: Single extremity supported;During functional activity Standing balance-Leahy Scale: Fair Standing balance comment: reliant on at least 1 UE support                           ADL either performed or assessed with clinical judgement   ADL Overall ADL's : Needs assistance/impaired     Grooming: Supervision/safety;Standing;Wash/dry hands;Oral care       Lower Body Bathing: Supervison/ safety;Sit to/from stand       Lower Body Dressing: Sit to/from stand;Supervision/safety Lower Body Dressing Details (indicate cue type and reason): increased time to don socks, close supervision when standing  Toilet Transfer: Supervision/safety;Ambulation Toilet Transfer Details (indicate cue type and reason): simulated in room  Toileting- Clothing Manipulation and Hygiene: Supervision/safety;Sit to/from stand   Tub/ Shower Transfer: Walk-in shower;Supervision/safety;3 in 1;Ambulation;Grab bars   Functional mobility during ADLs: Supervision/safety;Cane       Vision Baseline Vision/History: Wears glasses Wears Glasses: At all times Patient Visual Report: No change from baseline Vision Assessment?: No apparent visual deficits     Perception     Praxis      Pertinent Vitals/Pain Pain Assessment: No/denies pain     Hand Dominance Right   Extremity/Trunk Assessment Upper Extremity Assessment Upper Extremity Assessment: Defer to OT evaluation;Overall [facility] for tasks assessed   Lower Extremity Assessment Lower Extremity Assessment: Defer to PT evaluation       Communication Communication Communication: Deaf;Interpreter utilized   Cognition  Arousal/Alertness: Awake/alert Behavior During Therapy: WFL for tasks assessed/performed Overall Cognitive Status:  History of cognitive impairments - at baseline                                     General Comments  mother present and supportive    Exercises     Shoulder Instructions      Home Living Family/patient expects to be discharged to:: Private residence Living Arrangements: Parent Available Help at Discharge: Family Type of Home: Mobile home Home Access: Ramped entrance     Home Layout: One level     Bathroom Shower/Tub: Teacher, early years/pre: Standard     Home Equipment: Grab bars - tub/shower;Wheelchair - [employer] - 4 wheels;Cane - single point   Additional Comments: home set up provided by family      Prior Functioning/Environment Level of Independence: Independent        Comments: used cane for mobility         OT Problem List: Impaired balance (sitting and/or standing);Decreased safety awareness;Decreased knowledge of use of DME or AE      OT Treatment/Interventions: Self-care/ADL training;Therapeutic activities;Balance training;Patient/family education    OT Goals(Current goals can be found in the care plan section) Acute Rehab OT Goals Patient Stated Goal: home OT Goal Formulation: With patient Time For Goal Achievement: 03/13/18 Potential to Achieve Goals: Good  OT Frequency: Min 2X/week   Barriers to D/C:            Co-evaluation PT/OT/SLP Co-Evaluation/Treatment: Yes Reason for Co-Treatment: Complexity of the patient's impairments (multi-system involvement);Other (comment)(use of interpreter )   OT goals addressed during session: ADL's and self-care      AM-PAC OT "6 Clicks" Daily Activity     Outcome Measure Help from another person eating meals?: None Help from another person taking care of personal grooming?: None Help from another person toileting, which includes using toliet, bedpan, or urinal?: None Help from another person bathing (including washing, rinsing, drying)?: None Help from another person to  put on and taking off regular upper body clothing?: None Help from another person to put on and taking off regular lower body clothing?: None 6 Click Score: 24   End of Session Equipment Utilized During Treatment: Gait belt;Other (comment)(cane) Nurse Communication: Mobility status  Activity Tolerance: Patient tolerated treatment well Patient left: in bed;with call bell/phone within reach;with family/visitor present  OT Visit Diagnosis: Other abnormalities of gait and mobility (R26.89);Muscle weakness (generalized) (M62.81)                Time: 5093-2671 OT Time Calculation (min): 22 min Charges:  OT General Charges $OT Visit: 1 Visit OT Evaluation $OT Eval Moderate Complexity: [place], OT Acute Rehabilitation Services Pager [phone] Office [phone]    Delight Stare 02/27/2018, 12:53 PM

## 2018-02-27 NOTE — Evaluation (Signed)
Physical Therapy Evaluation Patient Details Name: Damon [name]. MRN: [id number] DOB: 1965-04-12 Today's Date: 02/27/2018   History of Present Illness  Pt is a 52 y/o male with medical history significant of TBI, L temporal lobe encephalomalacia, depression, deafness, seizure, MVP, GERD, who presents with left leg weakness and poor balance. MRI, CTA, EEG negative.   Clinical Impression  Orders received for PT evaluation. Patient demonstrates deficits in functional mobility as indicated below. Will benefit from continued skilled PT to address deficits and maximize function. Will see as indicated and progress as tolerated.  Mom and interpreter present during session. Mom reports close to baseline but patient does acknowledge continued left LE weakness. Recommend outpatient follow up.    Follow Up Recommendations Outpatient PT    Equipment Recommendations  None recommended by PT    Recommendations for Other Services       Precautions / Restrictions Precautions Precautions: Fall Precaution Comments: seizure Restrictions Weight Bearing Restrictions: No      Mobility  Bed Mobility Overal bed mobility: Needs Assistance Bed Mobility: Supine to Sit;Sit to Supine     Supine to sit: Supervision Sit to supine: Supervision   General bed mobility comments: supervision for safety   Transfers Overall transfer level: Needs assistance Equipment used: Straight cane Transfers: Sit to/from Stand Sit to Stand: Min guard;Supervision         General transfer comment: min guard initally fading to supervision for safety, improving with repititions    Ambulation/Gait Ambulation/Gait assistance: Min guard;Min assist Gait Distance (Feet): 110 Feet Assistive device: Straight cane Gait Pattern/deviations: Step-through pattern;Decreased stride length;Ataxic;Decreased dorsiflexion - left;Drifts right/left Gait velocity: decreased Gait velocity interpretation: <1.8 ft/sec, indicate of risk  for recurrent falls General Gait Details: patient with noted instability and several balance checks but able to self correct. Min guard to min assist for stability. Decreased LLE coordination of gait  Stairs            Wheelchair Mobility    Modified Rankin (Stroke Patients Only)       Balance Overall balance assessment: Needs assistance Sitting-balance support: No upper extremity supported;Feet supported Sitting balance-Leahy Scale: Good     Standing balance support: Single extremity supported;During functional activity Standing balance-Leahy Scale: Fair Standing balance comment: reliant on at least 1 UE support                             Pertinent Vitals/Pain Pain Assessment: No/denies pain    Home Living Family/patient expects to be discharged to:: Private residence Living Arrangements: Parent Available Help at Discharge: Family Type of Home: Mobile home Home Access: Ramped entrance     Home Layout: One level Home Equipment: Grab bars - tub/shower;Wheelchair - [employer] - 4 wheels;Cane - single point Additional Comments: home set up provided by family    Prior Function Level of Independence: Independent         Comments: used cane for mobility      Hand Dominance   Dominant Hand: Right    Extremity/Trunk Assessment   Upper Extremity Assessment Upper Extremity Assessment: Overall WFL for tasks assessed    Lower Extremity Assessment Lower Extremity Assessment: LLE deficits/detail LLE Deficits / Details: noted LLE weakness assymetrical grossly <5/5  LLE Coordination: decreased fine motor;decreased gross motor       Communication   Communication: Deaf;Interpreter utilized  Cognition Arousal/Alertness: Awake/alert Behavior During Therapy: WFL for tasks assessed/performed Overall Cognitive Status: History of cognitive impairments -  at baseline                                        General Comments General  comments (skin integrity, edema, etc.): mother present and supportive    Exercises     Assessment/Plan    PT Assessment Patient needs continued PT services  PT Problem List Decreased strength;Decreased activity tolerance;Decreased balance;Decreased mobility       PT Treatment Interventions DME instruction;Gait training;Functional mobility training;Therapeutic activities;Therapeutic exercise;Balance training;Neuromuscular re-education;Patient/family education    PT Goals (Current goals can be found in the Care Plan section)  Acute Rehab PT Goals Patient Stated Goal: to go home PT Goal Formulation: With patient/family Time For Goal Achievement: 03/13/18 Potential to Achieve Goals: Good    Frequency Min 3X/week   Barriers to discharge        Co-evaluation PT/OT/SLP Co-Evaluation/Treatment: Yes Reason for Co-Treatment: Complexity of the patient's impairments (multi-system involvement);Other (comment)(use of interpreter ) PT goals addressed during session: Mobility/safety with mobility OT goals addressed during session: ADL's and self-care       AM-PAC PT "6 Clicks" Mobility  Outcome Measure Help needed turning from your back to your side while in a flat bed without using bedrails?: None Help needed moving from lying on your back to sitting on the side of a flat bed without using bedrails?: None Help needed moving to and from a bed to a chair (including a wheelchair)?: A Little Help needed standing up from a chair using your arms (e.g., wheelchair or bedside chair)?: A Little Help needed to walk in hospital room?: A Little Help needed climbing 3-5 steps with a railing? : A Little 6 Click Score: 20    End of Session Equipment Utilized During Treatment: Gait belt Activity Tolerance: Patient tolerated treatment well Patient left: in bed;with call bell/phone within reach;with bed alarm set;with family/visitor present Nurse Communication: Mobility status PT Visit Diagnosis:  Unsteadiness on feet (R26.81);Difficulty in walking, not elsewhere classified (R26.2)    Time: 8786-7672 PT Time Calculation (min) (ACUTE ONLY): 22 min   Charges:   PT Evaluation $PT Eval Moderate Complexity: 1 Mod          Alben Deeds, PT DPT  Board Certified Neurologic Specialist Acute Rehabilitation Services Pager [phone] Office [place] 02/27/2018, 1:36 PM

## 2018-02-27 NOTE — Progress Notes (Addendum)
NEURO HOSPITALIST PROGRESS NOTE   Subjective: Patient awake, alert, in bed. NAD. Per family is back to his normal goofy self. No further seizure like/ shaking episodes noted. The episode yesterday lasted about 10 minutes total. Patient is DEAF at baseline.  Exam: Vitals:   02/26/18 2349 02/27/18 0403  BP: (!) 157/89 (!) 141/82  Pulse: 70 73  Resp:  18  Temp: 98 F (36.7 C) 98.3 F (36.8 C)  SpO2: 97% 98%    Physical Exam   HEENT-  Normocephalic, no lesions, without obvious abnormality.  Normal external eye and conjunctiva.   Cardiovascular- S1-S2 audible, pulses palpable throughout   Lungs-no rhonchi or wheezing noted, no excessive working breathing.  Saturations within normal limits on RA Abdomen- All 4 quadrants palpated and nontender Extremities- Warm, dry and intact Musculoskeletal-no joint tenderness, deformity or swelling Skin-warm and dry, no hyperpigmentation, vitiligo, or suspicious lesions   Neuro:  Mental Status: Alert, oriented, thought content appropriate.  Patient does not really speak d/t being deaf.  Able to follow commands without difficulty. Cranial Nerves: II:  Visual fields grossly normal,  III,IV, VI: ptosis not present, extra-ocular motions intact bilaterally pupils equal, round, reactive to light and accommodation V,VII: smile symmetric, facial light touch sensation normal bilaterally VIII: deaf at baseline IX,X: uvula rises midline XI: bilateral shoulder shrug XII: midline tongue extension Motor: Right : Upper extremity   5/5  Left:     Upper extremity   5/5  Lower extremity   5/5   Lower extremity   5/5 spasticity in BLE Sensory light touch intact throughout, bilaterally Deep Tendon Reflexes: 3+ and patella, +2 biceps,  Plantars: Right: upgoing   Left: upgoing Cerebellar: normal finger-to-nose, HTS no ataxia, but spastic movements noted Gait: deferred    Medications:  Prior to Admission:  Medications Prior to  Admission  Medication Sig Dispense Refill Last Dose  . acetaminophen (TYLENOL) 500 MG tablet Take 500 mg by mouth every 6 (six) hours as needed for mild pain.   unk  . albuterol (PROVENTIL HFA;VENTOLIN HFA) 108 (90 Base) MCG/ACT inhaler Inhale 2 puffs into the lungs every 8 (eight) hours as needed for wheezing or shortness of breath. 1 Inhaler 1 unk  . Cholecalciferol ([employer] VIT D 5000 HIGH-POTENCY PO) Take 1 tablet by mouth daily.    02/26/2018 at Unknown time  . famotidine (PEPCID) 20 MG tablet One at bedtime (Patient taking differently: Take 20 mg by mouth daily. One at bedtime ) 30 tablet 11 02/26/2018 at Unknown time  . FLUoxetine (PROZAC) 20 MG capsule Take 1 capsule (20 mg total) by mouth daily. 90 capsule 1 02/25/2018 at Unknown time  . pantoprazole (PROTONIX) 20 MG tablet Take 1 tablet (20 mg total) by mouth 2 (two) times daily before a meal. 180 tablet 1 02/26/2018 at Unknown time  . phenytoin (DILANTIN) 100 MG ER capsule 2 tabs twice daily (Patient taking differently: Take 100-200 mg by mouth See admin instructions. Takes 200mg  in the am and rotates every other night taking 100-200mg ) 360 capsule 3 02/26/2018 at Unknown time  . SYNTHROID 75 MCG tablet Take 1 tablet (75 mcg total) by mouth daily. 90 tablet 3 02/26/2018 at Unknown time   Scheduled: . aspirin  300 mg Rectal Daily   Or  . aspirin  325 mg Oral Daily  . enoxaparin (LOVENOX) injection  40 mg Subcutaneous Q24H  . famotidine  20 mg Oral QHS  . FLUoxetine  20 mg Oral Daily  . levothyroxine  75 mcg Oral Q0600  . pantoprazole  20 mg Oral BID AC  . phenytoin  100 mg Oral Q48H  . phenytoin  200 mg Oral Daily  . phenytoin  200 mg Oral Q48H   Continuous:   Pertinent Labs/Diagnostics:   Ct Angio Head W Or Wo Contrast  Result Date: 02/26/2018 CLINICAL DATA:  52 year old male with left leg shaking and weakness onset today. Prior left MCA infarct. EXAM: CT ANGIOGRAPHY HEAD AND NECK TECHNIQUE: Multidetector CT imaging of the  head and neck was performed using the standard protocol during bolus administration of intravenous contrast. Multiplanar CT image reconstructions and MIPs were obtained to evaluate the vascular anatomy. Carotid stenosis measurements (when applicable) are obtained utilizing NASCET criteria, using the distal internal carotid diameter as the denominator. CONTRAST:  141mL ISOVUE-370 IOPAMIDOL (ISOVUE-370) INJECTION 76% COMPARISON:  Head CT without contrast 1317 hours today. FINDINGS: CTA NECK Skeleton: Postoperative changes to the anterior mandible. Cervical spine dextroconvex scoliosis and cervicothoracic junction ankylosis. No acute osseous abnormality identified. Upper chest: Negative. Other neck: Thyromegaly. The largest thyroid nodule by CT is 11 millimeters which does not meet size criteria for follow-up. Otherwise negative neck soft tissues. Aortic arch: 3 vessel arch configuration with minimal arch atherosclerosis. Right carotid system: Negative. Left carotid system: Negative. Mildly tortuous left ICA just below the skull base. Vertebral arteries: Normal proximal right subclavian artery and right vertebral artery origin. The right vertebral is non dominant and diminutive but patent to the skull base without stenosis. No proximal left subclavian artery or left vertebral artery origin plaque or stenosis. The left vertebral origin is somewhat shared with a left thyrocervical trunk (series 7, image 282, normal variant). The left vertebral is dominant and patent to the skull base without stenosis. CTA HEAD Posterior circulation: There is mild to moderate irregularity and stenosis of the dominant left vertebral artery as it crosses the dura on series 11, image 29. No other distal vertebral stenosis. The left PICA origin is patent. There is similar mild irregularity and stenosis of the distal right vertebral artery as it crosses the dura. The right V4 segment appears occluded distal to the patent right PICA origin. The  left vertebral supplies the basilar. Patent basilar artery without stenosis. Normal SCA and PCA origins. Fetal type left PCA origin. Bilateral PCA branches are within normal limits. Anterior circulation: Both ICA siphons are patent. The left siphon appears somewhat dominant with mild calcified plaque at the anterior genu but no stenosis. The right siphon is non dominant and the right supraclinoid segment appears somewhat tapered and diminutive without focal stenosis (series 9 image 88). Patent carotid termini. The left ACA A1 segment is dominant and the right A1 is diminutive. ACA origins, anterior communicating artery and bilateral ACA branches are within normal limits. The left MCA origin is mildly irregular without stenosis. The left M1 and bifurcation are patent without stenosis. The left MCA branches appear mildly irregular. The right MCA origin and M1 segment are patent without stenosis. The right MCA bifurcation and M2 segments are patent without stenosis. No right MCA branch occlusion is identified. There is asymmetric elevation of the left MCA branches owing to the left hemisphere encephalomalacia. Venous sinuses: Patent on the delayed images. Anatomic variants: Dominant left vertebral artery. Fetal type left PCA origin. Delayed phase: No abnormal enhancement identified. Stable supratentorial gray-white matter differentiation throughout the brain. There is suggestion of linear hypodensity in  the right pons now on series 13, image 10. No associated hemorrhage or mass effect. Review of the MIP images confirms the above findings IMPRESSION: 1. Negative for large vessel occlusion in the anterior circulation but positive for occlusion of the non-dominant distal Right Vertebral Artery just beyond the right PICA origin which is age indeterminate. 2. Delayed images suggest a linear lacunar infarct in the right pons. No associated hemorrhage or mass effect. 3. No atherosclerosis in the neck, but up to moderate  stenosis of the dominant Left Vertebral Artery in the posterior fossa as it crosses the dura. 4. Dominant left ICA siphon with fetal type left PCA origin. The right supraclinoid ICA is diminutive but without focal stenosis. 5. Chronic Left MCA infarct with mild irregularity of the left MCA origin and left MCA branches without stenosis. Electronically Signed   By: Genevie Ann M.D.   On: 02/26/2018 16:50   Dg Chest 2 View  Result Date: 02/26/2018 CLINICAL DATA:  Mitral valve prolapse.  Tremors EXAM: CHEST - 2 VIEW COMPARISON:  May 13, 2017. FINDINGS: There is no evident edema or consolidation. The heart is upper normal in size with pulmonary vascularity normal. No adenopathy. No bone lesions. No radiopaque foreign bodies beyond overlying monitor leads. IMPRESSION: No edema or consolidation.  Stable cardiac silhouette. Electronically Signed   By: Lowella Grip III M.D.   On: 02/26/2018 16:20   Ct Head Wo Contrast  Result Date: 02/26/2018 CLINICAL DATA:  LEFT leg weakness and dizziness for 1 day. EXAM: CT HEAD WITHOUT CONTRAST TECHNIQUE: Contiguous axial images were obtained from the base of the skull through the vertex without intravenous contrast. COMPARISON:  None. FINDINGS: Brain: No definite acute stroke, acute hemorrhage, mass lesion, hydrocephalus, or extra-axial fluid. Advanced cerebellar atrophy. Extensive encephalomalacia LEFT hemisphere, frontotemporal, related to old stroke. Compensatory enlargement LEFT lateral ventricle. Mild to moderate premature cerebral atrophy. Hypoattenuation of white matter, likely small vessel disease. Vascular: Calcification of the cavernous internal carotid arteries consistent with cerebrovascular atherosclerotic disease. No emergent large vessel occlusion is evident. Skull: Normal. Negative for fracture or focal lesion. Sinuses/Orbits: No acute finding. Other: None. IMPRESSION: Chronic changes as described. Old LEFT MCA territory infarct. Cerebellar greater than  cerebral atrophy. Small vessel disease. No acute intracranial findings. Electronically Signed   By: Staci Righter M.D.   On: 02/26/2018 13:33   Ct Angio Neck W And/or Wo Contrast  Result Date: 02/26/2018 CLINICAL DATA:  52 year old male with left leg shaking and weakness onset today. Prior left MCA infarct. EXAM: CT ANGIOGRAPHY HEAD AND NECK TECHNIQUE: Multidetector CT imaging of the head and neck was performed using the standard protocol during bolus administration of intravenous contrast. Multiplanar CT image reconstructions and MIPs were obtained to evaluate the vascular anatomy. Carotid stenosis measurements (when applicable) are obtained utilizing NASCET criteria, using the distal internal carotid diameter as the denominator. CONTRAST:  148mL ISOVUE-370 IOPAMIDOL (ISOVUE-370) INJECTION 76% COMPARISON:  Head CT without contrast 1317 hours today. FINDINGS: CTA NECK Skeleton: Postoperative changes to the anterior mandible. Cervical spine dextroconvex scoliosis and cervicothoracic junction ankylosis. No acute osseous abnormality identified. Upper chest: Negative. Other neck: Thyromegaly. The largest thyroid nodule by CT is 11 millimeters which does not meet size criteria for follow-up. Otherwise negative neck soft tissues. Aortic arch: 3 vessel arch configuration with minimal arch atherosclerosis. Right carotid system: Negative. Left carotid system: Negative. Mildly tortuous left ICA just below the skull base. Vertebral arteries: Normal proximal right subclavian artery and right vertebral artery origin. The right  vertebral is non dominant and diminutive but patent to the skull base without stenosis. No proximal left subclavian artery or left vertebral artery origin plaque or stenosis. The left vertebral origin is somewhat shared with a left thyrocervical trunk (series 7, image 282, normal variant). The left vertebral is dominant and patent to the skull base without stenosis. CTA HEAD Posterior circulation:  There is mild to moderate irregularity and stenosis of the dominant left vertebral artery as it crosses the dura on series 11, image 29. No other distal vertebral stenosis. The left PICA origin is patent. There is similar mild irregularity and stenosis of the distal right vertebral artery as it crosses the dura. The right V4 segment appears occluded distal to the patent right PICA origin. The left vertebral supplies the basilar. Patent basilar artery without stenosis. Normal SCA and PCA origins. Fetal type left PCA origin. Bilateral PCA branches are within normal limits. Anterior circulation: Both ICA siphons are patent. The left siphon appears somewhat dominant with mild calcified plaque at the anterior genu but no stenosis. The right siphon is non dominant and the right supraclinoid segment appears somewhat tapered and diminutive without focal stenosis (series 9 image 88). Patent carotid termini. The left ACA A1 segment is dominant and the right A1 is diminutive. ACA origins, anterior communicating artery and bilateral ACA branches are within normal limits. The left MCA origin is mildly irregular without stenosis. The left M1 and bifurcation are patent without stenosis. The left MCA branches appear mildly irregular. The right MCA origin and M1 segment are patent without stenosis. The right MCA bifurcation and M2 segments are patent without stenosis. No right MCA branch occlusion is identified. There is asymmetric elevation of the left MCA branches owing to the left hemisphere encephalomalacia. Venous sinuses: Patent on the delayed images. Anatomic variants: Dominant left vertebral artery. Fetal type left PCA origin. Delayed phase: No abnormal enhancement identified. Stable supratentorial gray-white matter differentiation throughout the brain. There is suggestion of linear hypodensity in the right pons now on series 13, image 10. No associated hemorrhage or mass effect. Review of the MIP images confirms the above  findings IMPRESSION: 1. Negative for large vessel occlusion in the anterior circulation but positive for occlusion of the non-dominant distal Right Vertebral Artery just beyond the right PICA origin which is age indeterminate. 2. Delayed images suggest a linear lacunar infarct in the right pons. No associated hemorrhage or mass effect. 3. No atherosclerosis in the neck, but up to moderate stenosis of the dominant Left Vertebral Artery in the posterior fossa as it crosses the dura. 4. Dominant left ICA siphon with fetal type left PCA origin. The right supraclinoid ICA is diminutive but without focal stenosis. 5. Chronic Left MCA infarct with mild irregularity of the left MCA origin and left MCA branches without stenosis. Electronically Signed   By: Genevie Ann M.D.   On: 02/26/2018 16:50   Mr Brain Wo Contrast  Result Date: 02/26/2018 CLINICAL DATA:  Initial evaluation for acute left lower extremity shaking, gait instability. EXAM: MRI HEAD WITHOUT CONTRAST TECHNIQUE: Multiplanar, multiecho pulse sequences of the brain and surrounding structures were obtained without intravenous contrast. COMPARISON:  Prior CT and CTA from earlier the same day. FINDINGS: Brain: Moderately advanced cerebral and cerebellar atrophy with prominent vermian atrophy noted. Large area of cystic encephalomalacia involving the peripheral left frontotemporal region, likely related to history of remote traumatic brain injury. Scattered chronic hemosiderin staining present within this region. Chronic hemosiderin staining also noted at the cerebellar  vermis. No abnormal foci of restricted diffusion to suggest acute or subacute ischemia. Gray-white matter differentiation maintained. No other areas of remote cortical infarction. No evidence for acute intracranial hemorrhage. No mass lesion, midline shift or mass effect. No hydrocephalus. No extra-axial fluid collection. Pituitary gland normal. Vascular: Probable occluded right V4 segment, also seen  on prior CTA. Major intracranial vascular flow voids otherwise maintained. Skull and upper cervical spine: Craniocervical junction normal. Bone marrow signal intensity within normal limits. No scalp soft tissue abnormality. Sinuses/Orbits: Globes and orbital soft tissues within normal limits. Paranasal sinuses are largely clear. Trace right mastoid effusion noted, of doubtful significance. Inner ear structures grossly normal. Other: None. IMPRESSION: 1. No acute intracranial abnormality. 2. Large area of cystic encephalomalacia involving the peripheral left frontotemporal region, most likely related to history of traumatic brain injury. 3. Moderately advanced cerebral and cerebellar atrophy, with prominent vermian atrophy. Electronically Signed   By: Jeannine Boga M.D.   On: 02/26/2018 19:34   Assessment:  52 year old male presenting with seizure versus stroke-like symptoms involving his LLE > RLE.  1. 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 Cannot however rule out an acute cord lesion with exam alone.  2. Shaking of BLE described by his mother is not strongly suggestive of a seizure, but will need EEG to further evaluate.  3. MRI brain was negative for acute stroke. A large region of left temporal encephalomalacia from prior TBI is noted.    Recommendations:  1. MRI cervical and thoracic spine (ordered) 2. EEG (ordered)  Laurey Morale, MSN, NP-C [facility] [phone]  Attending neurologist's note to follow I have seen the patient and reviewed the above note.  EEG is negative, the fact that it was possibly bilateral does raise my concern for spinal cord pathology, though I suspect that this is likely an exacerbation of his underlying previous injury.  I have very low suspicion for any type of cerebral ischemia either TIA or stroke.  MRI cervical and thoracic spine, if this is negative then no  further recommendations at this time.  Roland Rack, MD [facility] [phone]  If 7pm- 7am, please page neurology on call as listed in [place].  02/27/2018, 7:23 AM

## 2018-02-27 NOTE — Procedures (Signed)
Echo attempted. EEG test in progress. Will attempt later.

## 2018-02-27 NOTE — Progress Notes (Signed)
Pt unable to tolerate any further imaging for MRI.  I turned in a limited Cervical MRI but patient refused to continue any further.

## 2018-02-27 NOTE — Progress Notes (Signed)
EEG completed; results pending.    

## 2018-02-28 ENCOUNTER — Inpatient Hospital Stay (HOSPITAL_COMMUNITY): Payer: Medicare HMO

## 2018-02-28 DIAGNOSIS — M4802 Spinal stenosis, cervical region: Secondary | ICD-10-CM

## 2018-02-28 DIAGNOSIS — S069XAA Unspecified intracranial injury with loss of consciousness status unknown, initial encounter: Secondary | ICD-10-CM

## 2018-02-28 DIAGNOSIS — R251 Tremor, unspecified: Secondary | ICD-10-CM

## 2018-02-28 DIAGNOSIS — F444 Conversion disorder with motor symptom or deficit: Secondary | ICD-10-CM

## 2018-02-28 DIAGNOSIS — R252 Cramp and spasm: Secondary | ICD-10-CM

## 2018-02-28 DIAGNOSIS — G542 Cervical root disorders, not elsewhere classified: Secondary | ICD-10-CM

## 2018-02-28 DIAGNOSIS — S069X0S Unspecified intracranial injury without loss of consciousness, sequela: Secondary | ICD-10-CM

## 2018-02-28 DIAGNOSIS — S069X9A Unspecified intracranial injury with loss of consciousness of unspecified duration, initial encounter: Secondary | ICD-10-CM

## 2018-02-28 DIAGNOSIS — R292 Abnormal reflex: Secondary | ICD-10-CM

## 2018-02-28 DIAGNOSIS — R29898 Other symptoms and signs involving the musculoskeletal system: Secondary | ICD-10-CM

## 2018-02-28 LAB — CERULOPLASMIN: CERULOPLASMIN: 28.4 mg/dL (ref 16.0–31.0)

## 2018-02-28 MED ORDER — LORAZEPAM 2 MG/ML IJ SOLN
1.0000 mg | Freq: Once | INTRAMUSCULAR | Status: AC
  Administered 2018-02-28: 2 mg via INTRAVENOUS
  Filled 2018-02-28: qty 1

## 2018-02-28 MED ORDER — SENNOSIDES-DOCUSATE SODIUM 8.6-50 MG PO TABS
2.0000 | ORAL_TABLET | Freq: Two times a day (BID) | ORAL | Status: DC
  Administered 2018-02-28 – 2018-03-02 (×4): 2 via ORAL
  Filled 2018-02-28 (×5): qty 2

## 2018-02-28 MED ORDER — POLYETHYLENE GLYCOL 3350 17 G PO PACK
17.0000 g | PACK | Freq: Every day | ORAL | Status: DC
  Administered 2018-03-01 – 2018-03-09 (×2): 17 g via ORAL
  Filled 2018-02-28 (×5): qty 1

## 2018-02-28 NOTE — Progress Notes (Signed)
PROGRESS NOTE    Damon [name].  EFE:[id number] DOB: Jan 16, 1966 DOA: 02/26/2018 PCP: [name], DO   Brief Narrative: Damon R [employer]. is a 52 y.o.malewith medical history significant ofhypothyroidism, depression, deafness, seizure, MVP, GERD, who presents with left leg shaking and poor balance.   Assessment & Plan:   Principal Problem:   TIA (transient ischemic attack) Active Problems:   Hypothyroidism   Seizure disorder ([place])   Hyperlipidemia   GERD (gastroesophageal reflux disease)   Communication disability- deaf -  needs extra time for communication   Depression   Left leg weakness   Spells of trembling   Spinal stenosis in cervical region   Cervical nerve root impingement   Leg weakness, bilateral   Hyperreflexia   Spastic   TBI (traumatic brain injury) ([place])   History of traumatic brain injury and chronic gait instability with superimposed acute onset of bilateral lower extremity weakness, hyperreflexia and spasticity.  MRI of the cervical spine showed significant cord compression at C5-C6. Neurology neurosurgery consulted plan for ACDF at C5-C6 early next week. Mentations as per neurology.    Seizure disorder continue with phenytoin   Hypothyroidism Continue with Synthroid 75 MCG daily.  GERD Continue with PPI.   History of depression Continue with Prozac at this time.   DVT prophylaxis: Lovenox Code Status: Full code Family Communication: Family at bedside translated with sign language Disposition Plan: Pending further work-up by neurosurgery  Consultants:   Neurology  Neurosurgery   Procedures: None  Antimicrobials: None  Subjective: No new complaints  Objective: Vitals:   02/28/18 0359 02/28/18 0744 02/28/18 1152 02/28/18 1531  BP: 126/89 134/90 (!) 146/94 135/80  Pulse: 75 75 83 91  Resp: 18 16 16 16   Temp: 97.7 F (36.5 C) 98.2 F (36.8 C) 97.8 F (36.6 C) (!) 97.5 F (36.4 C)  TempSrc: Oral Oral Oral Oral    SpO2: 97% 95% 92% 94%  Weight:      Height:        Intake/Output Summary (Last 24 hours) at 02/28/2018 1715 Last data filed at 02/28/2018 0800 Gross per 24 hour  Intake 360 ml  Output -  Net 360 ml   Filed Weights   02/26/18 1204 02/27/18 0403  Weight: 64.4 kg 65.9 kg    Examination:  General exam: Appears calm and comfortable  Respiratory system: Clear to auscultation. Respiratory effort normal. Cardiovascular system: S1 & S2 heard, RRR. No JVD,  Gastrointestinal system: Abdomen is nondistended, soft and nontender. Central nervous system: Alert and oriented to place and person. Extremities: Symmetric 5 x 5 power. Skin: No rashes, lesions or ulcers Psychiatry:  Mood & affect appropriate.     Data Reviewed: I have personally reviewed following labs and imaging studies  CBC: Recent Labs  Lab 02/26/18 1216  WBC 5.7  NEUTROABS 4.0  HGB 14.3  HCT 45.0  MCV 93.0  PLT 832   Basic Metabolic Panel: Recent Labs  Lab 02/26/18 1216  NA 138  K 3.7  CL 102  CO2 30  GLUCOSE 76  BUN 7  CREATININE 1.07  CALCIUM 8.6*   GFR: Estimated Creatinine Clearance: 74.4 mL/min (by C-G formula based on SCr of 1.07 mg/dL). Liver Function Tests: Recent Labs  Lab 02/26/18 1216  AST 27  ALT 22  ALKPHOS 67  BILITOT 0.5  PROT 6.7  ALBUMIN 3.7   No results for input(s): LIPASE, AMYLASE in the last 168 hours. No results for input(s): AMMONIA in the last 168 hours.  Coagulation Profile: Recent Labs  Lab 02/26/18 1216  INR 1.06   Cardiac Enzymes: Recent Labs  Lab 02/26/18 1329  TROPONINI <0.03   BNP (last 3 results) No results for input(s): PROBNP in the last 8760 hours. HbA1C: Recent Labs    02/26/18 2359  HGBA1C 4.4*   CBG: No results for input(s): GLUCAP in the last 168 hours. Lipid Profile: Recent Labs    02/26/18 2359  CHOL 214*  HDL 42  LDLCALC 157*  TRIG 74  CHOLHDL 5.1   Thyroid Function Tests: No results for input(s): TSH, T4TOTAL, FREET4,  T3FREE, THYROIDAB in the last 72 hours. Anemia Panel: Recent Labs    02/27/18 0807  VITAMINB12 115*   Sepsis Labs: No results for input(s): PROCALCITON, LATICACIDVEN in the last 168 hours.  No results found for this or any previous visit (from the past 240 hour(s)).       Radiology Studies: Mr Brain Wo Contrast  Result Date: 02/26/2018 CLINICAL DATA:  Initial evaluation for acute left lower extremity shaking, gait instability. EXAM: MRI HEAD WITHOUT CONTRAST TECHNIQUE: Multiplanar, multiecho pulse sequences of the brain and surrounding structures were obtained without intravenous contrast. COMPARISON:  Prior CT and CTA from earlier the same day. FINDINGS: Brain: Moderately advanced cerebral and cerebellar atrophy with prominent vermian atrophy noted. Large area of cystic encephalomalacia involving the peripheral left frontotemporal region, likely related to history of remote traumatic brain injury. Scattered chronic hemosiderin staining present within this region. Chronic hemosiderin staining also noted at the cerebellar vermis. No abnormal foci of restricted diffusion to suggest acute or subacute ischemia. Gray-white matter differentiation maintained. No other areas of remote cortical infarction. No evidence for acute intracranial hemorrhage. No mass lesion, midline shift or mass effect. No hydrocephalus. No extra-axial fluid collection. Pituitary gland normal. Vascular: Probable occluded right V4 segment, also seen on prior CTA. Major intracranial vascular flow voids otherwise maintained. Skull and upper cervical spine: Craniocervical junction normal. Bone marrow signal intensity within normal limits. No scalp soft tissue abnormality. Sinuses/Orbits: Globes and orbital soft tissues within normal limits. Paranasal sinuses are largely clear. Trace right mastoid effusion noted, of doubtful significance. Inner ear structures grossly normal. Other: None. IMPRESSION: 1. No acute intracranial  abnormality. 2. Large area of cystic encephalomalacia involving the peripheral left frontotemporal region, most likely related to history of traumatic brain injury. 3. Moderately advanced cerebral and cerebellar atrophy, with prominent vermian atrophy. Electronically Signed   By: Damon [name] M.D.   On: 02/26/2018 19:34   Mr Cervical Spine Wo Contrast  Result Date: 02/27/2018 CLINICAL DATA:  Initial evaluation for acute myelopathy. EXAM: MRI CERVICAL SPINE WITHOUT CONTRAST TECHNIQUE: Multiplanar, multisequence MR imaging of the cervical spine was performed. No intravenous contrast was administered. COMPARISON:  None available. FINDINGS: Alignment: Examination technically limited as the patient was unable to tolerate the full length of the exam. Sagittal T1, T2, and STIR sequences only were performed. No axial images are provided. Straightening of the normal cervical lordosis. Trace 2 mm retrolisthesis of C5 on C6, likely chronic and facet mediated. Vertebrae: Vertebral body heights maintained without evidence for acute or chronic fracture. Bone marrow signal intensity within normal limits. No discrete or worrisome osseous lesions. Reactive marrow edema about the left C2-3 facet due to facet arthritis (series 5, image 18). No other abnormal marrow edema. Cord: Patchy T2 signal abnormality within the cervical spinal cord at C5-6 due to stenosis, compatible with edema and/or myelomalacia (series 3, image 9). Signal intensity within the cervical spinal cord  otherwise grossly normal on this limited exam. Posterior Fossa, vertebral arteries, paraspinal tissues: Partially visualized brain and posterior fossa demonstrate no acute finding. Craniocervical junction normal. Paraspinous and prevertebral soft tissues grossly within normal limits. Probable enlarged multinodular goiter noted. Disc levels: C2-C3: No significant disc pathology. Left-sided facet arthrosis. No significant canal stenosis. C3-C4: No  significant disc pathology. No significant spinal stenosis. C4-C5: No significant disc pathology. No significant spinal stenosis. C5-C6: Circumferential disc osteophyte with intervertebral disc space narrowing. Resultant severe spinal stenosis with impingement of the cervical spinal cord and cord signal changes. Thecal sac measures approximately 4 mm in AP diameter at its most narrow point. C6-C7: Mild annular disc bulge with disc desiccation. No significant spinal stenosis. C7-T1: C7 and T1 vertebral bodies are partially ankylosed. No significant stenosis. Visualized upper thoracic spine within normal limits. IMPRESSION: 1. Technically limited examination due to patient's inability to tolerate the full length of the study. Sagittal sequences only were performed. 2. Degenerative disc osteophyte at C5-6 with resultant severe spinal stenosis with impingement of the cervical spinal cord. Associated cord signal abnormality at this level compatible with edema and/or myelomalacia. 3. Advanced left-sided facet hypertrophy at C2-3 with associated reactive marrow edema. Finding could contribute to neck pain. Electronically Signed   By: Damon [name] M.D.   On: 02/27/2018 17:43        Scheduled Meds: . aspirin  300 mg Rectal Daily   Or  . aspirin  325 mg Oral Daily  . enoxaparin (LOVENOX) injection  40 mg Subcutaneous Q24H  . famotidine  20 mg Oral QHS  . FLUoxetine  20 mg Oral Daily  . levothyroxine  75 mcg Oral Q0600  . pantoprazole  20 mg Oral BID AC  . phenytoin  100 mg Oral Q48H  . phenytoin  200 mg Oral Daily  . phenytoin  200 mg Oral Q48H   Continuous Infusions: . thiamine injection 500 mg (02/28/18 1245)     LOS: 1 day    Time spent: 30 minutes    Hosie Poisson, MD [facility] Pager [phone]  If 7PM-7AM, please contact night-coverage [email or web address] Password [email or web address] 02/28/2018, 5:15 PM

## 2018-02-28 NOTE — Consult Note (Signed)
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 segment are patent without stenosis. The right MCA bifurcation and M2 segments are patent without stenosis. No right MCA branch occlusion is identified. There is asymmetric elevation of the left MCA branches owing to the left hemisphere encephalomalacia. Venous sinuses: Patent on the delayed images. Anatomic variants: Dominant left vertebral artery. Fetal type left PCA origin. Delayed phase: No abnormal enhancement identified. Stable supratentorial gray-white matter differentiation throughout the brain. There is suggestion of linear hypodensity in the right pons now on series 13, image 10. No associated hemorrhage or mass effect. Review of the MIP images confirms the above findings  IMPRESSION: 1. Negative for large vessel occlusion in the anterior circulation but positive for occlusion of the non-dominant distal Right Vertebral Artery just beyond the right PICA origin which is age indeterminate. 2. Delayed images suggest a linear lacunar infarct in the right pons. No associated hemorrhage or mass effect. 3. No atherosclerosis in the neck, but up to moderate stenosis of the dominant Left Vertebral Artery in the posterior fossa as it crosses the dura. 4. Dominant left ICA siphon with fetal type left PCA origin. The right supraclinoid ICA is diminutive but without focal stenosis. 5. Chronic Left MCA infarct with mild irregularity of the left MCA origin and left MCA branches without stenosis. Electronically Signed   By: Genevie Ann M.D.   On: 02/26/2018 16:50   Mr Brain Wo Contrast  Result Date: 02/26/2018 CLINICAL DATA:  Initial evaluation for acute left lower extremity shaking, gait instability. EXAM: MRI HEAD WITHOUT CONTRAST TECHNIQUE: Multiplanar, multiecho pulse sequences of the brain and surrounding structures were obtained without  intravenous contrast. COMPARISON:  Prior CT and CTA from earlier the same day. FINDINGS: Brain: Moderately advanced cerebral and cerebellar atrophy with prominent vermian atrophy noted. Large area of cystic encephalomalacia involving the peripheral left frontotemporal region, likely related to history of remote traumatic brain injury. Scattered chronic hemosiderin staining present within this region. Chronic hemosiderin staining also noted at the cerebellar vermis. No abnormal foci of restricted diffusion to suggest acute or subacute ischemia. Gray-white matter differentiation maintained. No other areas of remote cortical infarction. No evidence for acute intracranial hemorrhage. No mass lesion, midline shift or mass effect. No hydrocephalus. No extra-axial fluid collection. Pituitary gland normal. Vascular: Probable occluded right V4 segment, also seen on prior  CTA. Major intracranial vascular flow voids otherwise maintained. Skull and upper cervical spine: Craniocervical junction normal. Bone marrow signal intensity within normal limits. No scalp soft tissue abnormality. Sinuses/Orbits: Globes and orbital soft tissues within normal limits. Paranasal sinuses are largely clear. Trace right mastoid effusion noted, of doubtful significance. Inner ear structures grossly normal. Other: None. IMPRESSION: 1. No acute intracranial abnormality. 2. Large area of cystic encephalomalacia involving the peripheral left frontotemporal region, most likely related to history of traumatic brain injury. 3. Moderately advanced cerebral and cerebellar atrophy, with prominent vermian atrophy. Electronically Signed   By: Jeannine Boga M.D.   On: 02/26/2018 19:34   Mr Cervical Spine Wo Contrast  Result Date: 02/27/2018 CLINICAL DATA:  Initial evaluation for acute myelopathy. EXAM: MRI CERVICAL SPINE WITHOUT CONTRAST TECHNIQUE: Multiplanar, multisequence MR imaging of the cervical spine was performed. No intravenous contrast was administered. COMPARISON:  None available. FINDINGS: Alignment: Examination technically limited as the patient was unable to tolerate the full length of the exam. Sagittal T1, T2, and STIR sequences only were performed. No axial images are provided. Straightening of the normal cervical lordosis. Trace 2 mm retrolisthesis of C5 on C6, likely chronic and facet mediated. Vertebrae: Vertebral body heights maintained without evidence for acute or chronic fracture. Bone marrow signal intensity within normal limits. No discrete or worrisome osseous lesions. Reactive marrow edema about the left C2-3 facet due to facet arthritis (series 5, image 18). No other abnormal marrow edema. Cord: Patchy T2 signal abnormality within the cervical spinal cord at C5-6 due to stenosis, compatible with edema and/or myelomalacia (series 3, image 9). Signal intensity within the cervical  spinal cord otherwise grossly normal on this limited exam. Posterior Fossa, vertebral arteries, paraspinal tissues: Partially visualized brain and posterior fossa demonstrate no acute finding. Craniocervical junction normal. Paraspinous and prevertebral soft tissues grossly within normal limits. Probable enlarged multinodular goiter noted. Disc levels: C2-C3: No significant disc pathology. Left-sided facet arthrosis. No significant canal stenosis. C3-C4: No significant disc pathology. No significant spinal stenosis. C4-C5: No significant disc pathology. No significant spinal stenosis. C5-C6: Circumferential disc osteophyte with intervertebral disc space narrowing. Resultant severe spinal stenosis with impingement of the cervical spinal cord and cord signal changes. Thecal sac measures approximately 4 mm in AP diameter at its most narrow point. C6-C7: Mild annular disc bulge with disc desiccation. No significant spinal stenosis. C7-T1: C7 and T1 vertebral bodies are partially ankylosed. No significant stenosis. Visualized upper thoracic spine within normal limits. IMPRESSION: 1. Technically limited examination due to patient's inability to tolerate the full length of the study. Sagittal sequences only were performed. 2. Degenerative disc osteophyte at C5-6 with resultant severe spinal stenosis with impingement of the cervical spinal cord. Associated cord signal abnormality at this level compatible with edema and/or myelomalacia. 3. Advanced left-sided facet  hypertrophy at C2-3 with associated reactive marrow edema. Finding could contribute to neck pain. Electronically Signed   By: Jeannine Boga M.D.   On: 02/27/2018 17:43    Impression/Plan   52 y.o. male who presented with now resolved LLE shaking and chronic intermittent gait instability. MRI C spine was reviewed. Primary findings are at C5-6 where there is severe multifactorial cervical spinal stenosis with associated cord signal changes. He is  myelopathic on exam. He will need to undergo surgical decompression to prevent worsening and for best hope at recovery. I have briefly discussed surgery including risks, benefits and alternatives. Patient and mother state understanding and wish to proceed. Attending to f/u regarding surgery planning.

## 2018-02-28 NOTE — Progress Notes (Signed)
SLP Cancellation Note  Patient Details Name: Damon [name]. MRN: [id number] DOB: 1965-07-30   Cancelled treatment:       Reason Eval/Treat Not Completed: SLP screened, no needs identified, will sign off; MRI brain on 02/26/18 indicated no acute intracranial abnormality; family assisted with translation via ASL and pt returned to baseline.  No evaluation indicated.   Elvina Sidle, M.S., CCC-SLP 02/28/2018, 3:22 PM

## 2018-02-28 NOTE — Progress Notes (Signed)
NEURO HOSPITALIST PROGRESS NOTE   Subjective: Patient awake, alert, in bed. Wife at bedside who interprets in [place] (hospital interpreter offered, but declined by wife). Patient is DEAF at baseline. No return of symptoms since admit. Pt c/o left side of neck pain, but no h/a. MRI c-spine showed cord impingement; NSX consulted. EEG neg for seizures. Nothing new on brain MR.   Exam: Vitals:   02/28/18 0359 02/28/18 0744  BP: 126/89 134/90  Pulse: 75 75  Resp: 18 16  Temp: 97.7 F (36.5 C) 98.2 F (36.8 C)  SpO2: 97% 95%    Physical Exam   HEENT-  Normocephalic, no lesions, without obvious abnormality.  Normal external eye and conjunctiva.   Cardiovascular- S1-S2 audible, pulses palpable throughout   Lungs-no rhonchi or wheezing noted, no excessive working breathing.  Saturations within normal limits on RA Abdomen- All 4 quadrants palpated and nontender Extremities- Warm, dry and intact Musculoskeletal-no joint tenderness, deformity or swelling Skin-warm and dry, no hyperpigmentation, vitiligo, or suspicious lesions   Neuro:  Mental Status: Alert, oriented, thought content appropriate per ASL interpretation.  Patient does not really speak d/t being deaf.  Able to follow commands without difficulty. Cranial Nerves: II:  Visual fields grossly normal,  III,IV, VI: ptosis not present, extra-ocular motions intact bilaterally pupils equal, round, reactive to light and accommodation V,VII: smile symmetric, facial light touch sensation normal bilaterally VIII: deaf at baseline IX,X: uvula rises midline XI: bilateral shoulder shrug XII: midline tongue extension Motor: Right : Upper extremity   5/5  Left:     Upper extremity   5/5  Lower extremity   5/5   Lower extremity   5/5 spasticity in BLE; atrophy Sensory light touch intact throughout, bilaterally Deep Tendon Reflexes: 3/4 Hyperreflexia in bilat LEs. UEs wnl 2+,  Plantars: Right: upgoing   Left:  upgoing Cerebellar: normal finger-to-nose, HTS no ataxia, but spastic movements noted Gait: deferred    Medications:  Prior to Admission:  Medications Prior to Admission  Medication Sig Dispense Refill Last Dose  . acetaminophen (TYLENOL) 500 MG tablet Take 500 mg by mouth every 6 (six) hours as needed for mild pain.   unk  . albuterol (PROVENTIL HFA;VENTOLIN HFA) 108 (90 Base) MCG/ACT inhaler Inhale 2 puffs into the lungs every 8 (eight) hours as needed for wheezing or shortness of breath. 1 Inhaler 1 unk  . Cholecalciferol ([employer] VIT D 5000 HIGH-POTENCY PO) Take 1 tablet by mouth daily.    02/26/2018 at Unknown time  . famotidine (PEPCID) 20 MG tablet One at bedtime (Patient taking differently: Take 20 mg by mouth daily. One at bedtime ) 30 tablet 11 02/26/2018 at Unknown time  . FLUoxetine (PROZAC) 20 MG capsule Take 1 capsule (20 mg total) by mouth daily. 90 capsule 1 02/25/2018 at Unknown time  . pantoprazole (PROTONIX) 20 MG tablet Take 1 tablet (20 mg total) by mouth 2 (two) times daily before a meal. 180 tablet 1 02/26/2018 at Unknown time  . phenytoin (DILANTIN) 100 MG ER capsule 2 tabs twice daily (Patient taking differently: Take 100-200 mg by mouth See admin instructions. Takes 200mg  in the am and rotates every other night taking 100-200mg ) 360 capsule 3 02/26/2018 at Unknown time  . SYNTHROID 75 MCG tablet Take 1 tablet (75 mcg total) by mouth daily. 90 tablet 3 02/26/2018 at Unknown time   Scheduled: . aspirin  300  mg Rectal Daily   Or  . aspirin  325 mg Oral Daily  . enoxaparin (LOVENOX) injection  40 mg Subcutaneous Q24H  . famotidine  20 mg Oral QHS  . FLUoxetine  20 mg Oral Daily  . levothyroxine  75 mcg Oral Q0600  . pantoprazole  20 mg Oral BID AC  . phenytoin  100 mg Oral Q48H  . phenytoin  200 mg Oral Daily  . phenytoin  200 mg Oral Q48H   Continuous: . thiamine injection      Pertinent Labs/Diagnostics:   Ct Angio Head W Or Wo Contrast  Result Date:  02/26/2018 CLINICAL DATA:  52 year old male with left leg shaking and weakness onset today. Prior left MCA infarct. EXAM: CT ANGIOGRAPHY HEAD AND NECK TECHNIQUE: Multidetector CT imaging of the head and neck was performed using the standard protocol during bolus administration of intravenous contrast. Multiplanar CT image reconstructions and MIPs were obtained to evaluate the vascular anatomy. Carotid stenosis measurements (when applicable) are obtained utilizing NASCET criteria, using the distal internal carotid diameter as the denominator. CONTRAST:  124mL ISOVUE-370 IOPAMIDOL (ISOVUE-370) INJECTION 76% COMPARISON:  Head CT without contrast 1317 hours today. FINDINGS: CTA NECK Skeleton: Postoperative changes to the anterior mandible. Cervical spine dextroconvex scoliosis and cervicothoracic junction ankylosis. No acute osseous abnormality identified. Upper chest: Negative. Other neck: Thyromegaly. The largest thyroid nodule by CT is 11 millimeters which does not meet size criteria for follow-up. Otherwise negative neck soft tissues. Aortic arch: 3 vessel arch configuration with minimal arch atherosclerosis. Right carotid system: Negative. Left carotid system: Negative. Mildly tortuous left ICA just below the skull base. Vertebral arteries: Normal proximal right subclavian artery and right vertebral artery origin. The right vertebral is non dominant and diminutive but patent to the skull base without stenosis. No proximal left subclavian artery or left vertebral artery origin plaque or stenosis. The left vertebral origin is somewhat shared with a left thyrocervical trunk (series 7, image 282, normal variant). The left vertebral is dominant and patent to the skull base without stenosis. CTA HEAD Posterior circulation: There is mild to moderate irregularity and stenosis of the dominant left vertebral artery as it crosses the dura on series 11, image 29. No other distal vertebral stenosis. The left PICA origin is  patent. There is similar mild irregularity and stenosis of the distal right vertebral artery as it crosses the dura. The right V4 segment appears occluded distal to the patent right PICA origin. The left vertebral supplies the basilar. Patent basilar artery without stenosis. Normal SCA and PCA origins. Fetal type left PCA origin. Bilateral PCA branches are within normal limits. Anterior circulation: Both ICA siphons are patent. The left siphon appears somewhat dominant with mild calcified plaque at the anterior genu but no stenosis. The right siphon is non dominant and the right supraclinoid segment appears somewhat tapered and diminutive without focal stenosis (series 9 image 88). Patent carotid termini. The left ACA A1 segment is dominant and the right A1 is diminutive. ACA origins, anterior communicating artery and bilateral ACA branches are within normal limits. The left MCA origin is mildly irregular without stenosis. The left M1 and bifurcation are patent without stenosis. The left MCA branches appear mildly irregular. The right MCA origin and M1 segment are patent without stenosis. The right MCA bifurcation and M2 segments are patent without stenosis. No right MCA branch occlusion is identified. There is asymmetric elevation of the left MCA branches owing to the left hemisphere encephalomalacia. Venous sinuses: Patent on the  delayed images. Anatomic variants: Dominant left vertebral artery. Fetal type left PCA origin. Delayed phase: No abnormal enhancement identified. Stable supratentorial gray-white matter differentiation throughout the brain. There is suggestion of linear hypodensity in the right pons now on series 13, image 10. No associated hemorrhage or mass effect. Review of the MIP images confirms the above findings IMPRESSION: 1. Negative for large vessel occlusion in the anterior circulation but positive for occlusion of the non-dominant distal Right Vertebral Artery just beyond the right PICA origin  which is age indeterminate. 2. Delayed images suggest a linear lacunar infarct in the right pons. No associated hemorrhage or mass effect. 3. No atherosclerosis in the neck, but up to moderate stenosis of the dominant Left Vertebral Artery in the posterior fossa as it crosses the dura. 4. Dominant left ICA siphon with fetal type left PCA origin. The right supraclinoid ICA is diminutive but without focal stenosis. 5. Chronic Left MCA infarct with mild irregularity of the left MCA origin and left MCA branches without stenosis. Electronically Signed   By: Genevie Ann M.D.   On: 02/26/2018 16:50   Dg Chest 2 View  Result Date: 02/26/2018 CLINICAL DATA:  Mitral valve prolapse.  Tremors EXAM: CHEST - 2 VIEW COMPARISON:  May 13, 2017. FINDINGS: There is no evident edema or consolidation. The heart is upper normal in size with pulmonary vascularity normal. No adenopathy. No bone lesions. No radiopaque foreign bodies beyond overlying monitor leads. IMPRESSION: No edema or consolidation.  Stable cardiac silhouette. Electronically Signed   By: Lowella Grip III M.D.   On: 02/26/2018 16:20   Ct Head Wo Contrast  Result Date: 02/26/2018 CLINICAL DATA:  LEFT leg weakness and dizziness for 1 day. EXAM: CT HEAD WITHOUT CONTRAST TECHNIQUE: Contiguous axial images were obtained from the base of the skull through the vertex without intravenous contrast. COMPARISON:  None. FINDINGS: Brain: No definite acute stroke, acute hemorrhage, mass lesion, hydrocephalus, or extra-axial fluid. Advanced cerebellar atrophy. Extensive encephalomalacia LEFT hemisphere, frontotemporal, related to old stroke. Compensatory enlargement LEFT lateral ventricle. Mild to moderate premature cerebral atrophy. Hypoattenuation of white matter, likely small vessel disease. Vascular: Calcification of the cavernous internal carotid arteries consistent with cerebrovascular atherosclerotic disease. No emergent large vessel occlusion is evident. Skull:  Normal. Negative for fracture or focal lesion. Sinuses/Orbits: No acute finding. Other: None. IMPRESSION: Chronic changes as described. Old LEFT MCA territory infarct. Cerebellar greater than cerebral atrophy. Small vessel disease. No acute intracranial findings. Electronically Signed   By: Staci Righter M.D.   On: 02/26/2018 13:33   Ct Angio Neck W And/or Wo Contrast  Result Date: 02/26/2018 CLINICAL DATA:  51 year old male with left leg shaking and weakness onset today. Prior left MCA infarct. EXAM: CT ANGIOGRAPHY HEAD AND NECK TECHNIQUE: Multidetector CT imaging of the head and neck was performed using the standard protocol during bolus administration of intravenous contrast. Multiplanar CT image reconstructions and MIPs were obtained to evaluate the vascular anatomy. Carotid stenosis measurements (when applicable) are obtained utilizing NASCET criteria, using the distal internal carotid diameter as the denominator. CONTRAST:  169mL ISOVUE-370 IOPAMIDOL (ISOVUE-370) INJECTION 76% COMPARISON:  Head CT without contrast 1317 hours today. FINDINGS: CTA NECK Skeleton: Postoperative changes to the anterior mandible. Cervical spine dextroconvex scoliosis and cervicothoracic junction ankylosis. No acute osseous abnormality identified. Upper chest: Negative. Other neck: Thyromegaly. The largest thyroid nodule by CT is 11 millimeters which does not meet size criteria for follow-up. Otherwise negative neck soft tissues. Aortic arch: 3 vessel arch configuration with  minimal arch atherosclerosis. Right carotid system: Negative. Left carotid system: Negative. Mildly tortuous left ICA just below the skull base. Vertebral arteries: Normal proximal right subclavian artery and right vertebral artery origin. The right vertebral is non dominant and diminutive but patent to the skull base without stenosis. No proximal left subclavian artery or left vertebral artery origin plaque or stenosis. The left vertebral origin is somewhat  shared with a left thyrocervical trunk (series 7, image 282, normal variant). The left vertebral is dominant and patent to the skull base without stenosis. CTA HEAD Posterior circulation: There is mild to moderate irregularity and stenosis of the dominant left vertebral artery as it crosses the dura on series 11, image 29. No other distal vertebral stenosis. The left PICA origin is patent. There is similar mild irregularity and stenosis of the distal right vertebral artery as it crosses the dura. The right V4 segment appears occluded distal to the patent right PICA origin. The left vertebral supplies the basilar. Patent basilar artery without stenosis. Normal SCA and PCA origins. Fetal type left PCA origin. Bilateral PCA branches are within normal limits. Anterior circulation: Both ICA siphons are patent. The left siphon appears somewhat dominant with mild calcified plaque at the anterior genu but no stenosis. The right siphon is non dominant and the right supraclinoid segment appears somewhat tapered and diminutive without focal stenosis (series 9 image 88). Patent carotid termini. The left ACA A1 segment is dominant and the right A1 is diminutive. ACA origins, anterior communicating artery and bilateral ACA branches are within normal limits. The left MCA origin is mildly irregular without stenosis. The left M1 and bifurcation are patent without stenosis. The left MCA branches appear mildly irregular. The right MCA origin and M1 segment are patent without stenosis. The right MCA bifurcation and M2 segments are patent without stenosis. No right MCA branch occlusion is identified. There is asymmetric elevation of the left MCA branches owing to the left hemisphere encephalomalacia. Venous sinuses: Patent on the delayed images. Anatomic variants: Dominant left vertebral artery. Fetal type left PCA origin. Delayed phase: No abnormal enhancement identified. Stable supratentorial gray-white matter differentiation  throughout the brain. There is suggestion of linear hypodensity in the right pons now on series 13, image 10. No associated hemorrhage or mass effect. Review of the MIP images confirms the above findings IMPRESSION: 1. Negative for large vessel occlusion in the anterior circulation but positive for occlusion of the non-dominant distal Right Vertebral Artery just beyond the right PICA origin which is age indeterminate. 2. Delayed images suggest a linear lacunar infarct in the right pons. No associated hemorrhage or mass effect. 3. No atherosclerosis in the neck, but up to moderate stenosis of the dominant Left Vertebral Artery in the posterior fossa as it crosses the dura. 4. Dominant left ICA siphon with fetal type left PCA origin. The right supraclinoid ICA is diminutive but without focal stenosis. 5. Chronic Left MCA infarct with mild irregularity of the left MCA origin and left MCA branches without stenosis. Electronically Signed   By: Genevie Ann M.D.   On: 02/26/2018 16:50   Mr Brain Wo Contrast  Result Date: 02/26/2018 CLINICAL DATA:  Initial evaluation for acute left lower extremity shaking, gait instability. EXAM: MRI HEAD WITHOUT CONTRAST TECHNIQUE: Multiplanar, multiecho pulse sequences of the brain and surrounding structures were obtained without intravenous contrast. COMPARISON:  Prior CT and CTA from earlier the same day. FINDINGS: Brain: Moderately advanced cerebral and cerebellar atrophy with prominent vermian atrophy noted. Large area  of cystic encephalomalacia involving the peripheral left frontotemporal region, likely related to history of remote traumatic brain injury. Scattered chronic hemosiderin staining present within this region. Chronic hemosiderin staining also noted at the cerebellar vermis. No abnormal foci of restricted diffusion to suggest acute or subacute ischemia. Gray-white matter differentiation maintained. No other areas of remote cortical infarction. No evidence for acute  intracranial hemorrhage. No mass lesion, midline shift or mass effect. No hydrocephalus. No extra-axial fluid collection. Pituitary gland normal. Vascular: Probable occluded right V4 segment, also seen on prior CTA. Major intracranial vascular flow voids otherwise maintained. Skull and upper cervical spine: Craniocervical junction normal. Bone marrow signal intensity within normal limits. No scalp soft tissue abnormality. Sinuses/Orbits: Globes and orbital soft tissues within normal limits. Paranasal sinuses are largely clear. Trace right mastoid effusion noted, of doubtful significance. Inner ear structures grossly normal. Other: None. IMPRESSION: 1. No acute intracranial abnormality. 2. Large area of cystic encephalomalacia involving the peripheral left frontotemporal region, most likely related to history of traumatic brain injury. 3. Moderately advanced cerebral and cerebellar atrophy, with prominent vermian atrophy. Electronically Signed   By: Jeannine Boga M.D.   On: 02/26/2018 19:34   Mr Cervical Spine Wo Contrast  Result Date: 02/27/2018 CLINICAL DATA:  Initial evaluation for acute myelopathy. EXAM: MRI CERVICAL SPINE WITHOUT CONTRAST TECHNIQUE: Multiplanar, multisequence MR imaging of the cervical spine was performed. No intravenous contrast was administered. COMPARISON:  None available. FINDINGS: Alignment: Examination technically limited as the patient was unable to tolerate the full length of the exam. Sagittal T1, T2, and STIR sequences only were performed. No axial images are provided. Straightening of the normal cervical lordosis. Trace 2 mm retrolisthesis of C5 on C6, likely chronic and facet mediated. Vertebrae: Vertebral body heights maintained without evidence for acute or chronic fracture. Bone marrow signal intensity within normal limits. No discrete or worrisome osseous lesions. Reactive marrow edema about the left C2-3 facet due to facet arthritis (series 5, image 18). No other  abnormal marrow edema. Cord: Patchy T2 signal abnormality within the cervical spinal cord at C5-6 due to stenosis, compatible with edema and/or myelomalacia (series 3, image 9). Signal intensity within the cervical spinal cord otherwise grossly normal on this limited exam. Posterior Fossa, vertebral arteries, paraspinal tissues: Partially visualized brain and posterior fossa demonstrate no acute finding. Craniocervical junction normal. Paraspinous and prevertebral soft tissues grossly within normal limits. Probable enlarged multinodular goiter noted. Disc levels: C2-C3: No significant disc pathology. Left-sided facet arthrosis. No significant canal stenosis. C3-C4: No significant disc pathology. No significant spinal stenosis. C4-C5: No significant disc pathology. No significant spinal stenosis. C5-C6: Circumferential disc osteophyte with intervertebral disc space narrowing. Resultant severe spinal stenosis with impingement of the cervical spinal cord and cord signal changes. Thecal sac measures approximately 4 mm in AP diameter at its most narrow point. C6-C7: Mild annular disc bulge with disc desiccation. No significant spinal stenosis. C7-T1: C7 and T1 vertebral bodies are partially ankylosed. No significant stenosis. Visualized upper thoracic spine within normal limits. IMPRESSION: 1. Technically limited examination due to patient's inability to tolerate the full length of the study. Sagittal sequences only were performed. 2. Degenerative disc osteophyte at C5-6 with resultant severe spinal stenosis with impingement of the cervical spinal cord. Associated cord signal abnormality at this level compatible with edema and/or myelomalacia. 3. Advanced left-sided facet hypertrophy at C2-3 with associated reactive marrow edema. Finding could contribute to neck pain. Electronically Signed   By: Jeannine Boga M.D.   On: 02/27/2018 17:43  Assessment:  52 year old male presenting with bilateral leg weakness and  spasms.    1.Severe spinal stenosis and cord impingement at C5 seen on MR- this would explain his bilat leg weakness. He is hyporeflexive and has spasticity on exam today, but weakness has resolved. Neuro surgery consulted 2. "Shaking" spell of BLE- likely d/t above; not strongly suggestive of a seizure. He is at risk for seizures d/t old TBI. EEG neg   -MRI brain was negative for acute stroke. A large region of left temporal encephalomalacia from prior TBI is noted.    Recommendations:  Neuro Surgery consulted No further recommendations or work up by gen neruo at this time We will s/o. Please call back if needed Updated RN I have reviewed MRI findings with pt and wife 101min spent face/face with pt and wife at bedside and in coordination of care  [place], MSN, ARNP-C, ANVP-BC [facility]  If 7pm- 7am, please page neurology on call as listed in [place].  02/28/2018, 8:25 AM

## 2018-03-01 LAB — CBC
HCT: 45.5 % (ref 39.0–52.0)
Hemoglobin: 15.3 g/dL (ref 13.0–17.0)
MCH: 30.7 pg (ref 26.0–34.0)
MCHC: 33.6 g/dL (ref 30.0–36.0)
MCV: 91.4 fL (ref 80.0–100.0)
Platelets: 195 10*3/uL (ref 150–400)
RBC: 4.98 MIL/uL (ref 4.22–5.81)
RDW: 12.8 % (ref 11.5–15.5)
WBC: 5.5 10*3/uL (ref 4.0–10.5)
nRBC: 0 % (ref 0.0–0.2)

## 2018-03-01 LAB — BASIC METABOLIC PANEL
Anion gap: 9 (ref 5–15)
BUN: 9 mg/dL (ref 6–20)
CO2: 27 mmol/L (ref 22–32)
Calcium: 8.9 mg/dL (ref 8.9–10.3)
Chloride: 102 mmol/L (ref 98–111)
Creatinine, Ser: 0.97 mg/dL (ref 0.61–1.24)
GFR calc Af Amer: >60 mL/min (ref >60)
GFR calc non Af Amer: >60 mL/min (ref >60)
Glucose, Bld: 54 mg/dL — ABNORMAL LOW (ref 70–99)
Potassium: 3.8 mmol/L (ref 3.5–5.1)
SODIUM: 138 mmol/L (ref 135–145)

## 2018-03-01 LAB — VITAMIN B1: Vitamin B1 (Thiamine): 165.7 nmol/L (ref 66.5–200.0)

## 2018-03-01 LAB — TSH: TSH: 3.777 u[IU]/mL (ref 0.350–4.500)

## 2018-03-01 NOTE — Plan of Care (Signed)
Patient stable, discussed POC with patient, agreeable with plan, denies question/concerns at this time.  

## 2018-03-01 NOTE — Progress Notes (Signed)
PROGRESS NOTE    Damon [name].  WUJ:[id number] DOB: [birth year] DOA: 02/26/2018 PCP: [name], DO   Brief Narrative: Damon R [employer]. is a 52 y.o.malewith medical history significant ofhypothyroidism, depression, deafness, seizure, MVP, GERD, who presents with left leg shaking and poor balance.   Assessment & Plan:   Principal Problem:   TIA (transient ischemic attack) Active Problems:   Hypothyroidism   Seizure disorder ([place])   Hyperlipidemia   GERD (gastroesophageal reflux disease)   Communication disability- deaf -  needs extra time for communication   Depression   Left leg weakness   Spells of trembling   Spinal stenosis in cervical region   Cervical nerve root impingement   Leg weakness, bilateral   Hyperreflexia   Spastic   TBI (traumatic brain injury) ([place])   History of traumatic brain injury and chronic gait instability with superimposed acute onset of bilateral lower extremity weakness, hyperreflexia and spasticity.  MRI of the cervical spine showed significant cord compression at C5-C6. Neurology recommendations appreciated.  Neurosurgery consulted plan for ACDF at C5-C6 early next week. MRI thoracic spine showed, Small central disc protrusion at T5-6 with mild spinal canal stenosis.   Seizure disorder:  continue with phenytoin   Hypothyroidism Continue with Synthroid 75 MCG daily.  GERD Continue with PPI.   History of depression Continue with Prozac at this time.  Constipation:  Senna and colace , miralax added.   Bradycardia with 3 sec pause: Pt asymptomatic.  TSH ordered.    DVT prophylaxis: Lovenox Code Status: Full code Family Communication: Family at bedside. Translator in the room.  Disposition Plan: Pending further work-up by neurosurgery  Consultants:   Neurology  Neurosurgery   Procedures: MRI cervical spine and thoracic spine.   Antimicrobials: None  Subjective: No bowel  Movement in 3 days.    Objective: Vitals:   02/28/18 1934 02/28/18 2324 03/01/18 0321 03/01/18 0736  BP: 112/79 122/75 124/79 132/84  Pulse: 81 75 76 67  Resp: 18 18 18 16   Temp: 97.8 F (36.6 C) 98 F (36.7 C) 97.6 F (36.4 C) 97.9 F (36.6 C)  TempSrc: Oral Oral Oral Oral  SpO2: 94% 96% 98% 97%  Weight:      Height:        Intake/Output Summary (Last 24 hours) at 03/01/2018 0947 Last data filed at 03/01/2018 0500 Gross per 24 hour  Intake 120 ml  Output 300 ml  Net -180 ml   Filed Weights   02/26/18 1204 02/27/18 0403  Weight: 64.4 kg 65.9 kg    Examination:  General exam: Appears calm and comfortable , not in distress.  Respiratory system: Clear to auscultation. Respiratory effort normal. No wheezing or rhonchi.  Cardiovascular system: S1 & S2 heard, RRR. No JVD,  Gastrointestinal system: Abdomen is soft , NT ND BS+ Central nervous system: Alert and oriented to place and person. Extremities: no pedal edema.  Skin: No rashes, lesions or ulcers Psychiatry:  Mood & affect appropriate.     Data Reviewed: I have personally reviewed following labs and imaging studies  CBC: Recent Labs  Lab 02/26/18 1216  WBC 5.7  NEUTROABS 4.0  HGB 14.3  HCT 45.0  MCV 93.0  PLT 956   Basic Metabolic Panel: Recent Labs  Lab 02/26/18 1216  NA 138  K 3.7  CL 102  CO2 30  GLUCOSE 76  BUN 7  CREATININE 1.07  CALCIUM 8.6*   GFR: Estimated Creatinine Clearance: 74.4 mL/min (by C-G formula based  on SCr of 1.07 mg/dL). Liver Function Tests: Recent Labs  Lab 02/26/18 1216  AST 27  ALT 22  ALKPHOS 67  BILITOT 0.5  PROT 6.7  ALBUMIN 3.7   No results for input(s): LIPASE, AMYLASE in the last 168 hours. No results for input(s): AMMONIA in the last 168 hours. Coagulation Profile: Recent Labs  Lab 02/26/18 1216  INR 1.06   Cardiac Enzymes: Recent Labs  Lab 02/26/18 1329  TROPONINI <0.03   BNP (last 3 results) No results for input(s): PROBNP in the last 8760  hours. HbA1C: Recent Labs    02/26/18 2359  HGBA1C 4.4*   CBG: No results for input(s): GLUCAP in the last 168 hours. Lipid Profile: Recent Labs    02/26/18 2359  CHOL 214*  HDL 42  LDLCALC 157*  TRIG 74  CHOLHDL 5.1   Thyroid Function Tests: No results for input(s): TSH, T4TOTAL, FREET4, T3FREE, THYROIDAB in the last 72 hours. Anemia Panel: Recent Labs    02/27/18 0807  VITAMINB12 115*   Sepsis Labs: No results for input(s): PROCALCITON, LATICACIDVEN in the last 168 hours.  No results found for this or any previous visit (from the past 240 hour(s)).       Radiology Studies: Mr Cervical Spine Wo Contrast  Result Date: 02/27/2018 CLINICAL DATA:  Initial evaluation for acute myelopathy. EXAM: MRI CERVICAL SPINE WITHOUT CONTRAST TECHNIQUE: Multiplanar, multisequence MR imaging of the cervical spine was performed. No intravenous contrast was administered. COMPARISON:  None available. FINDINGS: Alignment: Examination technically limited as the patient was unable to tolerate the full length of the exam. Sagittal T1, T2, and STIR sequences only were performed. No axial images are provided. Straightening of the normal cervical lordosis. Trace 2 mm retrolisthesis of C5 on C6, likely chronic and facet mediated. Vertebrae: Vertebral body heights maintained without evidence for acute or chronic fracture. Bone marrow signal intensity within normal limits. No discrete or worrisome osseous lesions. Reactive marrow edema about the left C2-3 facet due to facet arthritis (series 5, image 18). No other abnormal marrow edema. Cord: Patchy T2 signal abnormality within the cervical spinal cord at C5-6 due to stenosis, compatible with edema and/or myelomalacia (series 3, image 9). Signal intensity within the cervical spinal cord otherwise grossly normal on this limited exam. Posterior Fossa, vertebral arteries, paraspinal tissues: Partially visualized brain and posterior fossa demonstrate no acute  finding. Craniocervical junction normal. Paraspinous and prevertebral soft tissues grossly within normal limits. Probable enlarged multinodular goiter noted. Disc levels: C2-C3: No significant disc pathology. Left-sided facet arthrosis. No significant canal stenosis. C3-C4: No significant disc pathology. No significant spinal stenosis. C4-C5: No significant disc pathology. No significant spinal stenosis. C5-C6: Circumferential disc osteophyte with intervertebral disc space narrowing. Resultant severe spinal stenosis with impingement of the cervical spinal cord and cord signal changes. Thecal sac measures approximately 4 mm in AP diameter at its most narrow point. C6-C7: Mild annular disc bulge with disc desiccation. No significant spinal stenosis. C7-T1: C7 and T1 vertebral bodies are partially ankylosed. No significant stenosis. Visualized upper thoracic spine within normal limits. IMPRESSION: 1. Technically limited examination due to patient's inability to tolerate the full length of the study. Sagittal sequences only were performed. 2. Degenerative disc osteophyte at C5-6 with resultant severe spinal stenosis with impingement of the cervical spinal cord. Associated cord signal abnormality at this level compatible with edema and/or myelomalacia. 3. Advanced left-sided facet hypertrophy at C2-3 with associated reactive marrow edema. Finding could contribute to neck pain. Electronically Signed  By: Jeannine Boga M.D.   On: 02/27/2018 17:43   Mr Thoracic Spine Wo Contrast  Result Date: 02/28/2018 CLINICAL DATA:  Balance disturbance and left lower extremity tremor. EXAM: MRI THORACIC SPINE WITHOUT CONTRAST TECHNIQUE: Multiplanar, multisequence MR imaging of the thoracic spine was performed. No intravenous contrast was administered. COMPARISON:  None. FINDINGS: Alignment:  Physiologic. Vertebrae: No fracture, evidence of discitis, or bone lesion. Cord:  Normal signal and morphology. Paraspinal and other  soft tissues: Negative. Disc levels: The axial images are motion degraded. T5-6: Small central disc protrusion with mild spinal canal stenosis. Right-greater-than-left facet hypertrophy. T12-L1: Left-greater-than-right facet hypertrophy. No spinal canal stenosis. There is no other significant disc herniation or spinal stenosis. IMPRESSION: 1. No acute abnormality of the thoracic spine. 2. Small central disc protrusion at T5-6 with mild spinal canal stenosis. Electronically Signed   By: Ulyses Jarred M.D.   On: 02/28/2018 21:56        Scheduled Meds: . aspirin  300 mg Rectal Daily   Or  . aspirin  325 mg Oral Daily  . enoxaparin (LOVENOX) injection  40 mg Subcutaneous Q24H  . famotidine  20 mg Oral QHS  . FLUoxetine  20 mg Oral Daily  . levothyroxine  75 mcg Oral Q0600  . pantoprazole  20 mg Oral BID AC  . phenytoin  100 mg Oral Q48H  . phenytoin  200 mg Oral Daily  . phenytoin  200 mg Oral Q48H  . polyethylene glycol  17 g Oral Daily  . senna-docusate  2 tablet Oral BID   Continuous Infusions: . thiamine injection 500 mg (02/28/18 1245)     LOS: 2 days    Time spent: 32 minutes    Hosie Poisson, MD [facility] Pager [phone]  If 7PM-7AM, please contact night-coverage [email or web address] Password [email or web address] 03/01/2018, 9:47 AM

## 2018-03-01 NOTE — Progress Notes (Signed)
@   [phone] telemetry reported patient brady'd tp 23 with 3.18 second pause. Page to Dr Karleen Hampshire. Patient assymptomatic

## 2018-03-01 NOTE — Progress Notes (Signed)
Patient ID: Damon [name]., male   DOB: [birth year], 52 y.o.   MRN: [id number] Stable.  He is sitting up in bed feeding himself.  Obviously spastic.  Mother offers interpretation.  Plan for OR Monday or Tuesday per Dr. Cleotilde Neer note

## 2018-03-01 NOTE — Progress Notes (Addendum)
Paged [facility] on call to see if they wanted to medicate BP of 149/93 (108).

## 2018-03-02 DIAGNOSIS — E538 Deficiency of other specified B group vitamins: Secondary | ICD-10-CM | POA: Diagnosis present

## 2018-03-02 LAB — MRSA PCR SCREENING: MRSA BY PCR: NEGATIVE

## 2018-03-02 MED ORDER — CYANOCOBALAMIN 1000 MCG/ML IJ SOLN
1000.0000 ug | Freq: Once | INTRAMUSCULAR | Status: AC
  Administered 2018-03-02: 1000 ug via SUBCUTANEOUS
  Filled 2018-03-02: qty 1

## 2018-03-02 NOTE — Consult Note (Addendum)
[name] CM Primary Care Navigator  03/02/2018  Damon [name] [place]. [birth year] [id number]   Met with patient and mother [name]) at the [place] identify possible discharge needs. Mother provides sign language interpretation for patient who is deaf/ mute.   Mother reports that patient presented with left leg shaking and poor balance that had led to this admission and eventually surgery. (acute gait instability with C5- C6 cord compression with plan for anterior cervical discectomy and fusion tomorrow 03/03/18).  [name] endorsesDr.Deborah Opalski with Primary Care at [facility] as hisprimary care provider.   Patient uses [facility] [employer] obtain medications without any problem.   Motherstatesthatshehas beenmanagingmedicationsfor patient with use of "pill box" system filled once a week.  Patient's motherverbalized that she has beendriving and providingtransportation tohisdoctors' appointments.  Patient lives with his parents at home and mother serves ashis primary caregiver.  Anticipated discharge plan ishomewith Outpatient therapy per his mother.  Patient/ mothervoiced understanding to call primary care provider's office for a post discharge follow-up appointment within1- 2 weeks orsooner if needs arise. Patient letter (with PCP's contact number) wasprovided astheir reminder.  Discussed with patient/ motherregarding [facility] services available for health management andresourcesat homebut both denied needinganyservicesat this point. Patient's motherexpressedunderstandingof needto seekreferral from primary care provider to [facility] care management ifdeemed necessary and appropriatefor anyservicesin the future.  [facility] care management information was provided for futureneeds that patientmay have.  However, they hadverbally agreedand optedforEMMIcalls tofollow-up withpatient'srecovery at  home.Mother would like to be contacted [phone] or [phone]) and talked to since patient is deaf/ mute.  Referral made for [facility] General calls after discharge.    For additional questions please contact:  Edwena Felty A. Rece Zechman, BSN, RN-BC [facility] PRIMARY CARE Navigator Cell: [phone]

## 2018-03-02 NOTE — Progress Notes (Addendum)
  NEUROSURGERY PROGRESS NOTE   No issues overnight. No concerns this am  EXAM:  BP 124/80 (BP Location: Left Arm)   Pulse 69   Temp 97.7 F (36.5 C) (Oral)   Resp 18   Ht 5\' 10"  (1.778 m)   Wt 65.9 kg   SpO2 95%   BMI 20.85 kg/m   Awake, alert Cooperates with exam CN grossly intact MAEW without deficit, mild spasticity  PLAN Stable this am Plan is for surgery tomorrow NPO at midnight   I have seen and examined Damon [name]. and agree with the exam, impression, and plan as documented in the note by Ferne Reus, PA-C.  Consuella Lose, MD [facility]

## 2018-03-02 NOTE — Care Management Important Message (Signed)
Important Message  Patient Details  Name: Damon [name]. MRN: [id number] Date of Birth: 1965-06-03   Medicare Important Message Given:  Yes    Dashton Czerwinski 03/02/2018, 4:06 PM

## 2018-03-02 NOTE — Progress Notes (Signed)
Physical Therapy Treatment Patient Details Name: Damon [name]. MRN: [id number] DOB: [birth year] Today's Date: 03/02/2018    History of Present Illness Pt is a 52 y/o male with medical history significant of TBI, L temporal lobe encephalomalacia, depression, deafness, seizure, MVP, GERD, who presents with left leg weakness and poor balance. MRI, CTA, EEG negative.     PT Comments    Patient seen for mobility progression. Per chart review planned OR tomorrow for possible ACDF C5-6. Patient today ambulating in hallway with varying levels of assist required - with increased instability requires up to Mod A for balance and stability. Will continue to follow.    Follow Up Recommendations  Outpatient PT     Equipment Recommendations  None recommended by PT    Recommendations for Other Services       Precautions / Restrictions Precautions Precautions: Fall Precaution Comments: seizure Restrictions Weight Bearing Restrictions: No    Mobility  Bed Mobility Overal bed mobility: Needs Assistance Bed Mobility: Supine to Sit;Sit to Supine     Supine to sit: Supervision Sit to supine: Supervision   General bed mobility comments: for safety  Transfers Overall transfer level: Needs assistance Equipment used: Straight cane Transfers: Sit to/from Stand Sit to Stand: Min guard;Supervision         General transfer comment: for safety and immediate standing balance - mild unsteadiness  Ambulation/Gait Ambulation/Gait assistance: Min guard;Min assist;Mod assist Gait Distance (Feet): 180 Feet Assistive device: Straight cane Gait Pattern/deviations: Step-through pattern;Decreased stride length;Ataxic;Decreased dorsiflexion - left;Drifts right/left Gait velocity: decreased   General Gait Details: pateint with instability throughout - level of assist ranging from Min guard to Mod A with LOB; reduced coordination and overall efficiency   Stairs             Wheelchair  Mobility    Modified Rankin (Stroke Patients Only)       Balance Overall balance assessment: Needs assistance Sitting-balance support: No upper extremity supported;Feet supported Sitting balance-Leahy Scale: Good     Standing balance support: Single extremity supported;During functional activity Standing balance-Leahy Scale: Fair Standing balance comment: reliant on at least 1 UE support                            Cognition Arousal/Alertness: Awake/alert Behavior During Therapy: WFL for tasks assessed/performed Overall Cognitive Status: History of cognitive impairments - at baseline                                 General Comments: parents present adn supportive      Exercises      General Comments        Pertinent Vitals/Pain Pain Assessment: No/denies pain    Home Living                      Prior Function            PT Goals (current goals can now be found in the care plan section) Acute Rehab PT Goals Patient Stated Goal: to go home PT Goal Formulation: With patient/family Time For Goal Achievement: 03/13/18 Potential to Achieve Goals: Good Progress towards PT goals: Progressing toward goals    Frequency    Min 3X/week      PT Plan Current plan remains appropriate    Co-evaluation  AM-PAC PT "6 Clicks" Mobility   Outcome Measure  Help needed turning from your back to your side while in a flat bed without using bedrails?: None Help needed moving from lying on your back to sitting on the side of a flat bed without using bedrails?: None Help needed moving to and from a bed to a chair (including a wheelchair)?: A Little Help needed standing up from a chair using your arms (e.g., wheelchair or bedside chair)?: A Little Help needed to walk in hospital room?: A Little Help needed climbing 3-5 steps with a railing? : A Little 6 Click Score: 20    End of Session Equipment Utilized During  Treatment: Gait belt Activity Tolerance: Patient tolerated treatment well Patient left: in bed;with call bell/phone within reach;with bed alarm set;with family/visitor present Nurse Communication: Mobility status PT Visit Diagnosis: Unsteadiness on feet (R26.81);Difficulty in walking, not elsewhere classified (R26.2)     Time: 3524-8185 PT Time Calculation (min) (ACUTE ONLY): 18 min  Charges:  $Gait Training: 8-22 mins                     Lanney Gins, PT, DPT Supplemental Physical Therapist 03/02/18 4:01 PM Pager: [phone] Office: [phone]

## 2018-03-02 NOTE — Progress Notes (Signed)
Paged on call, Dr. Kennon Holter, due to pt bradying down to 22 with heart rate, did not sustain and quickly returned to 76.

## 2018-03-02 NOTE — Progress Notes (Signed)
PROGRESS NOTE                                                                                                                                                                                                             Patient Demographics:    Damon [name], is a 52 y.o. male, DOB - 04/22/1965, IEP:[id number]  Admit date - 02/26/2018   Admitting Physician Ivor Costa, MD  Outpatient Primary MD for the patient is [name], DO  LOS - 3  Outpatient Specialists: None  Chief Complaint  Patient presents with  . Near Syncope  . Weakness       Brief Narrative 52 year old male with history of traumatic brain injury, seizures, deafness, depression, hypothyroidism, GERD and MDP presenting with left leg shaking and poor balance.  MRI of the cervical spine showed significant cord compression at the level of C5-C6.  Seen by neurosurgery with plan on surgical intervention on 12/31.   Subjective:  Patient seen this morning and interpreted by his mother in sign language.  Reports some pain in his neck.   Assessment  & Plan :    Principal Problem: Acute gait instability with C5-C6 cord compression Patient able to ambulate short distance with the help of walker.  Neurosurgery plan on anterior cervical discectomy and fusion tomorrow. Noted for low B12 (115).  Will replenish with subcu dose.  Active Problems: Seizure disorder ([place]) EEG without seizure activity.  Continue Dilantin.     Hypothyroidism Continue Synthroid.  TSH normal.  History of depression Continue Prozac  Constipation Continue senna, Colace and MiraLAX.  GERD Continue PPI     TBI (traumatic brain injury) ([place])      Code Status : Full code  Family Communication  : Mother at bedside  Disposition Plan  : Pending cervical spine surgery tomorrow  Barriers For Discharge : Active symptoms  Consults  : Neurosurgery, neurology  Procedures  :  EEG, MRI brain, cervical and thoracic spine CT angiogram of the head and neck  DVT Prophylaxis  :  Lovenox   Lab Results  Component Value Date   PLT 195 03/01/2018    Antibiotics  :  Anti-infectives (From admission, onward)   None        Objective:   Vitals:   03/01/18 2359 03/02/18 0340 03/02/18 0726 03/02/18 1100  BP: 136/89 129/70  124/80 115/70  Pulse: 79 74 69 86  Resp: 18 18 18 18   Temp: 97.9 F (36.6 C) 98.2 F (36.8 C) 97.7 F (36.5 C) 98 F (36.7 C)  TempSrc: Oral Oral Oral Oral  SpO2: 96% 95% 95% 94%  Weight:      Height:        Wt Readings from Last 3 Encounters:  02/27/18 65.9 kg  02/16/18 67.6 kg  01/13/18 65.5 kg     Intake/Output Summary (Last 24 hours) at 03/02/2018 1229 Last data filed at 03/02/2018 0800 Gross per 24 hour  Intake 100 ml  Output -  Net 100 ml     Physical Exam  Gen: not in distress HEENT: moist mucosa, supple neck Chest: clear b/l, no added sounds CVS: N S1&S2, no murmurs, rubs or gallop GI: soft, NT, ND, BS+ Musculoskeletal: warm, no edema CNS: Alert and oriented, spasticity of left leg.    Data Review:    CBC Recent Labs  Lab 02/26/18 1216 03/01/18 1004  WBC 5.7 5.5  HGB 14.3 15.3  HCT 45.0 45.5  PLT 193 195  MCV 93.0 91.4  MCH 29.5 30.7  MCHC 31.8 33.6  RDW 12.7 12.8  LYMPHSABS 1.1  --   MONOABS 0.4  --   EOSABS 0.1  --   BASOSABS 0.0  --     Chemistries  Recent Labs  Lab 02/26/18 1216 03/01/18 1004  NA 138 138  K 3.7 3.8  CL 102 102  CO2 30 27  GLUCOSE 76 54*  BUN 7 9  CREATININE 1.07 0.97  CALCIUM 8.6* 8.9  AST 27  --   ALT 22  --   ALKPHOS 67  --   BILITOT 0.5  --    ------------------------------------------------------------------------------------------------------------------ No results for input(s): CHOL, HDL, LDLCALC, TRIG, CHOLHDL, LDLDIRECT in the last 72 hours.  Lab Results  Component Value Date   HGBA1C 4.4 (L) 02/26/2018    ------------------------------------------------------------------------------------------------------------------ Recent Labs    03/01/18 1004  TSH 3.777   ------------------------------------------------------------------------------------------------------------------ No results for input(s): VITAMINB12, FOLATE, FERRITIN, TIBC, IRON, RETICCTPCT in the last 72 hours.  Coagulation profile Recent Labs  Lab 02/26/18 1216  INR 1.06    No results for input(s): DDIMER in the last 72 hours.  Cardiac Enzymes Recent Labs  Lab 02/26/18 1329  TROPONINI <0.03   ------------------------------------------------------------------------------------------------------------------ No results found for: BNP  Inpatient Medications  Scheduled Meds: . aspirin  300 mg Rectal Daily   Or  . aspirin  325 mg Oral Daily  . enoxaparin (LOVENOX) injection  40 mg Subcutaneous Q24H  . famotidine  20 mg Oral QHS  . FLUoxetine  20 mg Oral Daily  . levothyroxine  75 mcg Oral Q0600  . pantoprazole  20 mg Oral BID AC  . phenytoin  100 mg Oral Q48H  . phenytoin  200 mg Oral Daily  . phenytoin  200 mg Oral Q48H  . polyethylene glycol  17 g Oral Daily  . senna-docusate  2 tablet Oral BID   Continuous Infusions: . thiamine injection 500 mg (03/02/18 1104)   PRN Meds:.acetaminophen **OR** acetaminophen (TYLENOL) oral liquid 160 mg/5 mL **OR** acetaminophen, albuterol, dextromethorphan-guaiFENesin, LORazepam  Micro Results No results found for this or any previous visit (from the past 240 hour(s)).  Radiology Reports Ct Angio Head W Or Wo Contrast  Result Date: 02/26/2018 CLINICAL DATA:  52 year old male with left leg shaking and weakness onset today. Prior left MCA infarct. EXAM: CT ANGIOGRAPHY HEAD AND NECK TECHNIQUE: Multidetector CT  imaging of the head and neck was performed using the standard protocol during bolus administration of intravenous contrast. Multiplanar CT image reconstructions and  MIPs were obtained to evaluate the vascular anatomy. Carotid stenosis measurements (when applicable) are obtained utilizing NASCET criteria, using the distal internal carotid diameter as the denominator. CONTRAST:  157mL ISOVUE-370 IOPAMIDOL (ISOVUE-370) INJECTION 76% COMPARISON:  Head CT without contrast 1317 hours today. FINDINGS: CTA NECK Skeleton: Postoperative changes to the anterior mandible. Cervical spine dextroconvex scoliosis and cervicothoracic junction ankylosis. No acute osseous abnormality identified. Upper chest: Negative. Other neck: Thyromegaly. The largest thyroid nodule by CT is 11 millimeters which does not meet size criteria for follow-up. Otherwise negative neck soft tissues. Aortic arch: 3 vessel arch configuration with minimal arch atherosclerosis. Right carotid system: Negative. Left carotid system: Negative. Mildly tortuous left ICA just below the skull base. Vertebral arteries: Normal proximal right subclavian artery and right vertebral artery origin. The right vertebral is non dominant and diminutive but patent to the skull base without stenosis. No proximal left subclavian artery or left vertebral artery origin plaque or stenosis. The left vertebral origin is somewhat shared with a left thyrocervical trunk (series 7, image 282, normal variant). The left vertebral is dominant and patent to the skull base without stenosis. CTA HEAD Posterior circulation: There is mild to moderate irregularity and stenosis of the dominant left vertebral artery as it crosses the dura on series 11, image 29. No other distal vertebral stenosis. The left PICA origin is patent. There is similar mild irregularity and stenosis of the distal right vertebral artery as it crosses the dura. The right V4 segment appears occluded distal to the patent right PICA origin. The left vertebral supplies the basilar. Patent basilar artery without stenosis. Normal SCA and PCA origins. Fetal type left PCA origin. Bilateral PCA  branches are within normal limits. Anterior circulation: Both ICA siphons are patent. The left siphon appears somewhat dominant with mild calcified plaque at the anterior genu but no stenosis. The right siphon is non dominant and the right supraclinoid segment appears somewhat tapered and diminutive without focal stenosis (series 9 image 88). Patent carotid termini. The left ACA A1 segment is dominant and the right A1 is diminutive. ACA origins, anterior communicating artery and bilateral ACA branches are within normal limits. The left MCA origin is mildly irregular without stenosis. The left M1 and bifurcation are patent without stenosis. The left MCA branches appear mildly irregular. The right MCA origin and M1 segment are patent without stenosis. The right MCA bifurcation and M2 segments are patent without stenosis. No right MCA branch occlusion is identified. There is asymmetric elevation of the left MCA branches owing to the left hemisphere encephalomalacia. Venous sinuses: Patent on the delayed images. Anatomic variants: Dominant left vertebral artery. Fetal type left PCA origin. Delayed phase: No abnormal enhancement identified. Stable supratentorial gray-white matter differentiation throughout the brain. There is suggestion of linear hypodensity in the right pons now on series 13, image 10. No associated hemorrhage or mass effect. Review of the MIP images confirms the above findings IMPRESSION: 1. Negative for large vessel occlusion in the anterior circulation but positive for occlusion of the non-dominant distal Right Vertebral Artery just beyond the right PICA origin which is age indeterminate. 2. Delayed images suggest a linear lacunar infarct in the right pons. No associated hemorrhage or mass effect. 3. No atherosclerosis in the neck, but up to moderate stenosis of the dominant Left Vertebral Artery in the posterior fossa as it crosses the  dura. 4. Dominant left ICA siphon with fetal type left PCA  origin. The right supraclinoid ICA is diminutive but without focal stenosis. 5. Chronic Left MCA infarct with mild irregularity of the left MCA origin and left MCA branches without stenosis. Electronically Signed   By: Genevie Ann M.D.   On: 02/26/2018 16:50   Dg Chest 2 View  Result Date: 02/26/2018 CLINICAL DATA:  Mitral valve prolapse.  Tremors EXAM: CHEST - 2 VIEW COMPARISON:  May 13, 2017. FINDINGS: There is no evident edema or consolidation. The heart is upper normal in size with pulmonary vascularity normal. No adenopathy. No bone lesions. No radiopaque foreign bodies beyond overlying monitor leads. IMPRESSION: No edema or consolidation.  Stable cardiac silhouette. Electronically Signed   By: Lowella Grip III M.D.   On: 02/26/2018 16:20   Ct Head Wo Contrast  Result Date: 02/26/2018 CLINICAL DATA:  LEFT leg weakness and dizziness for 1 day. EXAM: CT HEAD WITHOUT CONTRAST TECHNIQUE: Contiguous axial images were obtained from the base of the skull through the vertex without intravenous contrast. COMPARISON:  None. FINDINGS: Brain: No definite acute stroke, acute hemorrhage, mass lesion, hydrocephalus, or extra-axial fluid. Advanced cerebellar atrophy. Extensive encephalomalacia LEFT hemisphere, frontotemporal, related to old stroke. Compensatory enlargement LEFT lateral ventricle. Mild to moderate premature cerebral atrophy. Hypoattenuation of white matter, likely small vessel disease. Vascular: Calcification of the cavernous internal carotid arteries consistent with cerebrovascular atherosclerotic disease. No emergent large vessel occlusion is evident. Skull: Normal. Negative for fracture or focal lesion. Sinuses/Orbits: No acute finding. Other: None. IMPRESSION: Chronic changes as described. Old LEFT MCA territory infarct. Cerebellar greater than cerebral atrophy. Small vessel disease. No acute intracranial findings. Electronically Signed   By: Staci Righter M.D.   On: 02/26/2018 13:33   Ct  Angio Neck W And/or Wo Contrast  Result Date: 02/26/2018 CLINICAL DATA:  52 year old male with left leg shaking and weakness onset today. Prior left MCA infarct. EXAM: CT ANGIOGRAPHY HEAD AND NECK TECHNIQUE: Multidetector CT imaging of the head and neck was performed using the standard protocol during bolus administration of intravenous contrast. Multiplanar CT image reconstructions and MIPs were obtained to evaluate the vascular anatomy. Carotid stenosis measurements (when applicable) are obtained utilizing NASCET criteria, using the distal internal carotid diameter as the denominator. CONTRAST:  175mL ISOVUE-370 IOPAMIDOL (ISOVUE-370) INJECTION 76% COMPARISON:  Head CT without contrast 1317 hours today. FINDINGS: CTA NECK Skeleton: Postoperative changes to the anterior mandible. Cervical spine dextroconvex scoliosis and cervicothoracic junction ankylosis. No acute osseous abnormality identified. Upper chest: Negative. Other neck: Thyromegaly. The largest thyroid nodule by CT is 11 millimeters which does not meet size criteria for follow-up. Otherwise negative neck soft tissues. Aortic arch: 3 vessel arch configuration with minimal arch atherosclerosis. Right carotid system: Negative. Left carotid system: Negative. Mildly tortuous left ICA just below the skull base. Vertebral arteries: Normal proximal right subclavian artery and right vertebral artery origin. The right vertebral is non dominant and diminutive but patent to the skull base without stenosis. No proximal left subclavian artery or left vertebral artery origin plaque or stenosis. The left vertebral origin is somewhat shared with a left thyrocervical trunk (series 7, image 282, normal variant). The left vertebral is dominant and patent to the skull base without stenosis. CTA HEAD Posterior circulation: There is mild to moderate irregularity and stenosis of the dominant left vertebral artery as it crosses the dura on series 11, image 29. No other distal  vertebral stenosis. The left PICA origin is patent. There  is similar mild irregularity and stenosis of the distal right vertebral artery as it crosses the dura. The right V4 segment appears occluded distal to the patent right PICA origin. The left vertebral supplies the basilar. Patent basilar artery without stenosis. Normal SCA and PCA origins. Fetal type left PCA origin. Bilateral PCA branches are within normal limits. Anterior circulation: Both ICA siphons are patent. The left siphon appears somewhat dominant with mild calcified plaque at the anterior genu but no stenosis. The right siphon is non dominant and the right supraclinoid segment appears somewhat tapered and diminutive without focal stenosis (series 9 image 88). Patent carotid termini. The left ACA A1 segment is dominant and the right A1 is diminutive. ACA origins, anterior communicating artery and bilateral ACA branches are within normal limits. The left MCA origin is mildly irregular without stenosis. The left M1 and bifurcation are patent without stenosis. The left MCA branches appear mildly irregular. The right MCA origin and M1 segment are patent without stenosis. The right MCA bifurcation and M2 segments are patent without stenosis. No right MCA branch occlusion is identified. There is asymmetric elevation of the left MCA branches owing to the left hemisphere encephalomalacia. Venous sinuses: Patent on the delayed images. Anatomic variants: Dominant left vertebral artery. Fetal type left PCA origin. Delayed phase: No abnormal enhancement identified. Stable supratentorial gray-white matter differentiation throughout the brain. There is suggestion of linear hypodensity in the right pons now on series 13, image 10. No associated hemorrhage or mass effect. Review of the MIP images confirms the above findings IMPRESSION: 1. Negative for large vessel occlusion in the anterior circulation but positive for occlusion of the non-dominant distal Right  Vertebral Artery just beyond the right PICA origin which is age indeterminate. 2. Delayed images suggest a linear lacunar infarct in the right pons. No associated hemorrhage or mass effect. 3. No atherosclerosis in the neck, but up to moderate stenosis of the dominant Left Vertebral Artery in the posterior fossa as it crosses the dura. 4. Dominant left ICA siphon with fetal type left PCA origin. The right supraclinoid ICA is diminutive but without focal stenosis. 5. Chronic Left MCA infarct with mild irregularity of the left MCA origin and left MCA branches without stenosis. Electronically Signed   By: Genevie Ann M.D.   On: 02/26/2018 16:50   Mr Brain Wo Contrast  Result Date: 02/26/2018 CLINICAL DATA:  Initial evaluation for acute left lower extremity shaking, gait instability. EXAM: MRI HEAD WITHOUT CONTRAST TECHNIQUE: Multiplanar, multiecho pulse sequences of the brain and surrounding structures were obtained without intravenous contrast. COMPARISON:  Prior CT and CTA from earlier the same day. FINDINGS: Brain: Moderately advanced cerebral and cerebellar atrophy with prominent vermian atrophy noted. Large area of cystic encephalomalacia involving the peripheral left frontotemporal region, likely related to history of remote traumatic brain injury. Scattered chronic hemosiderin staining present within this region. Chronic hemosiderin staining also noted at the cerebellar vermis. No abnormal foci of restricted diffusion to suggest acute or subacute ischemia. Gray-white matter differentiation maintained. No other areas of remote cortical infarction. No evidence for acute intracranial hemorrhage. No mass lesion, midline shift or mass effect. No hydrocephalus. No extra-axial fluid collection. Pituitary gland normal. Vascular: Probable occluded right V4 segment, also seen on prior CTA. Major intracranial vascular flow voids otherwise maintained. Skull and upper cervical spine: Craniocervical junction normal. Bone  marrow signal intensity within normal limits. No scalp soft tissue abnormality. Sinuses/Orbits: Globes and orbital soft tissues within normal limits. Paranasal sinuses are largely  clear. Trace right mastoid effusion noted, of doubtful significance. Inner ear structures grossly normal. Other: None. IMPRESSION: 1. No acute intracranial abnormality. 2. Large area of cystic encephalomalacia involving the peripheral left frontotemporal region, most likely related to history of traumatic brain injury. 3. Moderately advanced cerebral and cerebellar atrophy, with prominent vermian atrophy. Electronically Signed   By: Jeannine Boga M.D.   On: 02/26/2018 19:34   Mr Cervical Spine Wo Contrast  Result Date: 02/27/2018 CLINICAL DATA:  Initial evaluation for acute myelopathy. EXAM: MRI CERVICAL SPINE WITHOUT CONTRAST TECHNIQUE: Multiplanar, multisequence MR imaging of the cervical spine was performed. No intravenous contrast was administered. COMPARISON:  None available. FINDINGS: Alignment: Examination technically limited as the patient was unable to tolerate the full length of the exam. Sagittal T1, T2, and STIR sequences only were performed. No axial images are provided. Straightening of the normal cervical lordosis. Trace 2 mm retrolisthesis of C5 on C6, likely chronic and facet mediated. Vertebrae: Vertebral body heights maintained without evidence for acute or chronic fracture. Bone marrow signal intensity within normal limits. No discrete or worrisome osseous lesions. Reactive marrow edema about the left C2-3 facet due to facet arthritis (series 5, image 18). No other abnormal marrow edema. Cord: Patchy T2 signal abnormality within the cervical spinal cord at C5-6 due to stenosis, compatible with edema and/or myelomalacia (series 3, image 9). Signal intensity within the cervical spinal cord otherwise grossly normal on this limited exam. Posterior Fossa, vertebral arteries, paraspinal tissues: Partially  visualized brain and posterior fossa demonstrate no acute finding. Craniocervical junction normal. Paraspinous and prevertebral soft tissues grossly within normal limits. Probable enlarged multinodular goiter noted. Disc levels: C2-C3: No significant disc pathology. Left-sided facet arthrosis. No significant canal stenosis. C3-C4: No significant disc pathology. No significant spinal stenosis. C4-C5: No significant disc pathology. No significant spinal stenosis. C5-C6: Circumferential disc osteophyte with intervertebral disc space narrowing. Resultant severe spinal stenosis with impingement of the cervical spinal cord and cord signal changes. Thecal sac measures approximately 4 mm in AP diameter at its most narrow point. C6-C7: Mild annular disc bulge with disc desiccation. No significant spinal stenosis. C7-T1: C7 and T1 vertebral bodies are partially ankylosed. No significant stenosis. Visualized upper thoracic spine within normal limits. IMPRESSION: 1. Technically limited examination due to patient's inability to tolerate the full length of the study. Sagittal sequences only were performed. 2. Degenerative disc osteophyte at C5-6 with resultant severe spinal stenosis with impingement of the cervical spinal cord. Associated cord signal abnormality at this level compatible with edema and/or myelomalacia. 3. Advanced left-sided facet hypertrophy at C2-3 with associated reactive marrow edema. Finding could contribute to neck pain. Electronically Signed   By: Jeannine Boga M.D.   On: 02/27/2018 17:43   Mr Thoracic Spine Wo Contrast  Result Date: 02/28/2018 CLINICAL DATA:  Balance disturbance and left lower extremity tremor. EXAM: MRI THORACIC SPINE WITHOUT CONTRAST TECHNIQUE: Multiplanar, multisequence MR imaging of the thoracic spine was performed. No intravenous contrast was administered. COMPARISON:  None. FINDINGS: Alignment:  Physiologic. Vertebrae: No fracture, evidence of discitis, or bone lesion.  Cord:  Normal signal and morphology. Paraspinal and other soft tissues: Negative. Disc levels: The axial images are motion degraded. T5-6: Small central disc protrusion with mild spinal canal stenosis. Right-greater-than-left facet hypertrophy. T12-L1: Left-greater-than-right facet hypertrophy. No spinal canal stenosis. There is no other significant disc herniation or spinal stenosis. IMPRESSION: 1. No acute abnormality of the thoracic spine. 2. Small central disc protrusion at T5-6 with mild spinal canal stenosis. Electronically  Signed   By: Ulyses Jarred M.D.   On: 02/28/2018 21:56    Time Spent in minutes  25   Rian Koon M.D on 03/02/2018 at 12:29 PM  Between 7am to 7pm - Pager - [phone]  After 7pm go to [email or web address] - password [facility]  [facility] -  Office  [phone]

## 2018-03-03 ENCOUNTER — Encounter (HOSPITAL_COMMUNITY): Payer: Self-pay | Admitting: Anesthesiology

## 2018-03-03 ENCOUNTER — Encounter (HOSPITAL_COMMUNITY)
Admission: EM | Disposition: A | Discharge status: Home / Self Care | Payer: Self-pay | Source: Home / Self Care | Attending: Internal Medicine

## 2018-03-03 ENCOUNTER — Inpatient Hospital Stay (HOSPITAL_COMMUNITY): Payer: Medicare HMO

## 2018-03-03 ENCOUNTER — Inpatient Hospital Stay (HOSPITAL_COMMUNITY): Payer: Medicare HMO | Admitting: Certified Registered"

## 2018-03-03 HISTORY — PX: ANTERIOR CERVICAL DECOMP/DISCECTOMY FUSION: SHX1161

## 2018-03-03 SURGERY — ANTERIOR CERVICAL DECOMPRESSION/DISCECTOMY FUSION 1 LEVEL
Anesthesia: General | Site: Spine Cervical

## 2018-03-03 MED ORDER — LIDOCAINE 2% (20 MG/ML) 5 ML SYRINGE
INTRAMUSCULAR | Status: AC
  Filled 2018-03-03: qty 5

## 2018-03-03 MED ORDER — 0.9 % SODIUM CHLORIDE (POUR BTL) OPTIME
TOPICAL | Status: DC | PRN
  Administered 2018-03-03: 1000 mL

## 2018-03-03 MED ORDER — MIDAZOLAM HCL 2 MG/2ML IJ SOLN
INTRAMUSCULAR | Status: AC
  Filled 2018-03-03: qty 2

## 2018-03-03 MED ORDER — FENTANYL CITRATE (PF) 250 MCG/5ML IJ SOLN
INTRAMUSCULAR | Status: AC
  Filled 2018-03-03: qty 5

## 2018-03-03 MED ORDER — LIDOCAINE-EPINEPHRINE 1 %-1:100000 IJ SOLN
INTRAMUSCULAR | Status: DC | PRN
  Administered 2018-03-03: 5 mL

## 2018-03-03 MED ORDER — HYDROCODONE-ACETAMINOPHEN 5-325 MG PO TABS
1.0000 | ORAL_TABLET | ORAL | Status: DC | PRN
  Filled 2018-03-03: qty 1

## 2018-03-03 MED ORDER — PROMETHAZINE HCL 25 MG/ML IJ SOLN: 6.2500 mg | INTRAMUSCULAR | Status: DC | PRN

## 2018-03-03 MED ORDER — SUGAMMADEX SODIUM 200 MG/2ML IV SOLN
INTRAVENOUS | Status: DC | PRN
  Administered 2018-03-03: 150 mg via INTRAVENOUS

## 2018-03-03 MED ORDER — OXYCODONE HCL 5 MG PO TABS: 5.0000 mg | ORAL_TABLET | ORAL | Status: DC | PRN

## 2018-03-03 MED ORDER — FENTANYL CITRATE (PF) 100 MCG/2ML IJ SOLN
INTRAMUSCULAR | Status: DC | PRN
  Administered 2018-03-03: 150 ug via INTRAVENOUS

## 2018-03-03 MED ORDER — METHOCARBAMOL 500 MG PO TABS
500.0000 mg | ORAL_TABLET | Freq: Four times a day (QID) | ORAL | Status: DC | PRN
  Administered 2018-03-07 – 2018-03-08 (×2): 500 mg via ORAL
  Filled 2018-03-03 (×3): qty 1

## 2018-03-03 MED ORDER — THROMBIN 5000 UNITS EX SOLR
OROMUCOSAL | Status: DC | PRN
  Administered 2018-03-03: 09:00:00

## 2018-03-03 MED ORDER — DOCUSATE SODIUM 100 MG PO CAPS
100.0000 mg | ORAL_CAPSULE | Freq: Two times a day (BID) | ORAL | Status: DC
  Administered 2018-03-03 – 2018-03-04 (×2): 100 mg via ORAL
  Filled 2018-03-03 (×3): qty 1

## 2018-03-03 MED ORDER — DEXAMETHASONE SODIUM PHOSPHATE 10 MG/ML IJ SOLN
INTRAMUSCULAR | Status: DC | PRN
  Administered 2018-03-03: 10 mg via INTRAVENOUS

## 2018-03-03 MED ORDER — BUPIVACAINE HCL (PF) 0.5 % IJ SOLN
INTRAMUSCULAR | Status: AC
  Filled 2018-03-03: qty 30

## 2018-03-03 MED ORDER — BISACODYL 10 MG RE SUPP: 10.0000 mg | Freq: Every day | RECTAL | Status: DC | PRN

## 2018-03-03 MED ORDER — EPHEDRINE SULFATE-NACL 50-0.9 MG/10ML-% IV SOSY
PREFILLED_SYRINGE | INTRAVENOUS | Status: DC | PRN
  Administered 2018-03-03 (×2): 5 mg via INTRAVENOUS

## 2018-03-03 MED ORDER — PROPOFOL 10 MG/ML IV BOLUS
INTRAVENOUS | Status: AC
  Filled 2018-03-03: qty 20

## 2018-03-03 MED ORDER — THROMBIN 5000 UNITS EX SOLR
CUTANEOUS | Status: AC
  Filled 2018-03-03: qty 15000

## 2018-03-03 MED ORDER — BUPIVACAINE HCL 0.5 % IJ SOLN
INTRAMUSCULAR | Status: DC | PRN
  Administered 2018-03-03: 5 mL

## 2018-03-03 MED ORDER — LIDOCAINE-EPINEPHRINE 1 %-1:100000 IJ SOLN
INTRAMUSCULAR | Status: AC
  Filled 2018-03-03: qty 1

## 2018-03-03 MED ORDER — MENTHOL 3 MG MT LOZG: 1.0000 | LOZENGE | OROMUCOSAL | Status: DC | PRN

## 2018-03-03 MED ORDER — SENNA 8.6 MG PO TABS
1.0000 | ORAL_TABLET | Freq: Two times a day (BID) | ORAL | Status: DC
  Administered 2018-03-03 – 2018-03-04 (×2): 8.6 mg via ORAL
  Filled 2018-03-03 (×4): qty 1

## 2018-03-03 MED ORDER — ROCURONIUM BROMIDE 10 MG/ML (PF) SYRINGE
PREFILLED_SYRINGE | INTRAVENOUS | Status: DC | PRN
  Administered 2018-03-03: 50 mg via INTRAVENOUS

## 2018-03-03 MED ORDER — PROPOFOL 10 MG/ML IV BOLUS
INTRAVENOUS | Status: DC | PRN
  Administered 2018-03-03: 140 mg via INTRAVENOUS

## 2018-03-03 MED ORDER — CEFAZOLIN SODIUM-DEXTROSE 2-4 GM/100ML-% IV SOLN
2.0000 g | Freq: Three times a day (TID) | INTRAVENOUS | Status: AC
  Administered 2018-03-03 (×2): 2 g via INTRAVENOUS
  Filled 2018-03-03 (×2): qty 100

## 2018-03-03 MED ORDER — CEFAZOLIN SODIUM-DEXTROSE 2-4 GM/100ML-% IV SOLN: 2.0000 g | INTRAVENOUS | Status: DC

## 2018-03-03 MED ORDER — CEFAZOLIN SODIUM-DEXTROSE 2-3 GM-%(50ML) IV SOLR
INTRAVENOUS | Status: DC | PRN
  Administered 2018-03-03: 2 g via INTRAVENOUS

## 2018-03-03 MED ORDER — ACETAMINOPHEN 650 MG RE SUPP
650.0000 mg | RECTAL | Status: DC | PRN
  Administered 2018-03-05: 650 mg via RECTAL
  Filled 2018-03-03 (×2): qty 1

## 2018-03-03 MED ORDER — METHOCARBAMOL 1000 MG/10ML IJ SOLN
500.0000 mg | Freq: Four times a day (QID) | INTRAVENOUS | Status: DC | PRN
  Administered 2018-03-04 – 2018-03-05 (×3): 500 mg via INTRAVENOUS
  Filled 2018-03-03 (×5): qty 5

## 2018-03-03 MED ORDER — GABAPENTIN 300 MG PO CAPS
300.0000 mg | ORAL_CAPSULE | Freq: Three times a day (TID) | ORAL | Status: DC
  Administered 2018-03-03 – 2018-03-04 (×4): 300 mg via ORAL
  Filled 2018-03-03 (×6): qty 1

## 2018-03-03 MED ORDER — ONDANSETRON HCL 4 MG/2ML IJ SOLN
INTRAMUSCULAR | Status: DC | PRN
  Administered 2018-03-03: 4 mg via INTRAVENOUS

## 2018-03-03 MED ORDER — ONDANSETRON HCL 4 MG/2ML IJ SOLN: 4.0000 mg | Freq: Four times a day (QID) | INTRAMUSCULAR | Status: DC | PRN

## 2018-03-03 MED ORDER — HYDROMORPHONE HCL 1 MG/ML IJ SOLN
0.5000 mg | INTRAMUSCULAR | Status: DC | PRN
  Administered 2018-03-04: 1 mg via INTRAVENOUS
  Administered 2018-03-04: 0.5 mg via INTRAVENOUS
  Administered 2018-03-05 – 2018-03-06 (×4): 1 mg via INTRAVENOUS
  Administered 2018-03-07: 0.5 mg via INTRAVENOUS
  Filled 2018-03-03 (×2): qty 1
  Filled 2018-03-03: qty 0.5
  Filled 2018-03-03 (×4): qty 1
  Filled 2018-03-03: qty 0.5

## 2018-03-03 MED ORDER — SODIUM CHLORIDE 0.9% FLUSH
3.0000 mL | Freq: Two times a day (BID) | INTRAVENOUS | Status: DC
  Administered 2018-03-03 – 2018-03-10 (×12): 3 mL via INTRAVENOUS

## 2018-03-03 MED ORDER — ACETAMINOPHEN 325 MG PO TABS: 650.0000 mg | ORAL_TABLET | ORAL | Status: DC | PRN

## 2018-03-03 MED ORDER — LACTATED RINGERS IV SOLN
INTRAVENOUS | Status: DC | PRN
  Administered 2018-03-03 (×2): via INTRAVENOUS

## 2018-03-03 MED ORDER — SODIUM CHLORIDE 0.9 % IV SOLN: 250.0000 mL | INTRAVENOUS | Status: DC

## 2018-03-03 MED ORDER — SUCCINYLCHOLINE CHLORIDE 20 MG/ML IJ SOLN
INTRAMUSCULAR | Status: DC | PRN
  Administered 2018-03-03: 140 mg via INTRAVENOUS

## 2018-03-03 MED ORDER — SODIUM CHLORIDE 0.9 % IV SOLN
INTRAVENOUS | Status: DC | PRN
  Administered 2018-03-03: 09:00:00

## 2018-03-03 MED ORDER — LACTATED RINGERS IV SOLN: INTRAVENOUS | Status: DC

## 2018-03-03 MED ORDER — MIDAZOLAM HCL 5 MG/5ML IJ SOLN
INTRAMUSCULAR | Status: DC | PRN
  Administered 2018-03-03: 2 mg via INTRAVENOUS

## 2018-03-03 MED ORDER — MEPERIDINE HCL 50 MG/ML IJ SOLN: 6.2500 mg | INTRAMUSCULAR | Status: DC | PRN

## 2018-03-03 MED ORDER — LIDOCAINE 2% (20 MG/ML) 5 ML SYRINGE
INTRAMUSCULAR | Status: DC | PRN
  Administered 2018-03-03: 60 mg via INTRAVENOUS

## 2018-03-03 MED ORDER — SODIUM CHLORIDE 0.9 % IV SOLN
INTRAVENOUS | Status: DC
  Administered 2018-03-03: 13:00:00 via INTRAVENOUS

## 2018-03-03 MED ORDER — HYDROMORPHONE HCL 1 MG/ML IJ SOLN: 0.2500 mg | INTRAMUSCULAR | Status: DC | PRN

## 2018-03-03 MED ORDER — SODIUM CHLORIDE 0.9% FLUSH: 3.0000 mL | INTRAVENOUS | Status: DC | PRN

## 2018-03-03 MED ORDER — PHENOL 1.4 % MT LIQD: 1.0000 | OROMUCOSAL | Status: DC | PRN

## 2018-03-03 MED ORDER — ROCURONIUM BROMIDE 50 MG/5ML IV SOSY
PREFILLED_SYRINGE | INTRAVENOUS | Status: AC
  Filled 2018-03-03: qty 5

## 2018-03-03 MED ORDER — FLEET ENEMA 7-19 GM/118ML RE ENEM: 1.0000 | ENEMA | Freq: Once | RECTAL | Status: DC | PRN

## 2018-03-03 MED ORDER — ONDANSETRON HCL 4 MG PO TABS: 4.0000 mg | ORAL_TABLET | Freq: Four times a day (QID) | ORAL | Status: DC | PRN

## 2018-03-03 MED ORDER — SENNOSIDES-DOCUSATE SODIUM 8.6-50 MG PO TABS: 1.0000 | ORAL_TABLET | Freq: Every evening | ORAL | Status: DC | PRN

## 2018-03-03 MED ORDER — VECURONIUM BROMIDE 10 MG IV SOLR
INTRAVENOUS | Status: DC | PRN
  Administered 2018-03-03 (×3): 2 mg via INTRAVENOUS

## 2018-03-03 MED ORDER — SODIUM CHLORIDE 0.9 % IV SOLN
INTRAVENOUS | Status: DC | PRN
  Administered 2018-03-03: 25 ug/min via INTRAVENOUS

## 2018-03-03 MED ORDER — ACETAMINOPHEN 500 MG PO TABS
1000.0000 mg | ORAL_TABLET | Freq: Four times a day (QID) | ORAL | Status: AC
  Administered 2018-03-03 – 2018-03-04 (×4): 1000 mg via ORAL
  Filled 2018-03-03 (×5): qty 2

## 2018-03-03 SURGICAL SUPPLY — 57 items
BAG DECANTER FOR FLEXI CONT (MISCELLANEOUS) ×2 IMPLANT
BENZOIN TINCTURE PRP APPL 2/3 (GAUZE/BANDAGES/DRESSINGS) ×2 IMPLANT
BLADE CLIPPER SURG (BLADE) IMPLANT
BLADE SURG 11 STRL SS (BLADE) ×2 IMPLANT
BLADE ULTRA TIP 2M (BLADE) IMPLANT
BUR MATCHSTICK NEURO 3.0 LAGG (BURR) ×2 IMPLANT
CAGE EXPANSE 8X6X6 (Cage) ×2 IMPLANT
CAGE PEEK 6X14X11 (Cage) ×2 IMPLANT
CANISTER SUCT 3000ML PPV (MISCELLANEOUS) ×2 IMPLANT
CARTRIDGE OIL MAESTRO DRILL (MISCELLANEOUS) ×1 IMPLANT
COVER WAND RF STERILE (DRAPES) IMPLANT
DECANTER SPIKE VIAL GLASS SM (MISCELLANEOUS) IMPLANT
DERMABOND ADVANCED (GAUZE/BANDAGES/DRESSINGS) ×1
DERMABOND ADVANCED .7 DNX12 (GAUZE/BANDAGES/DRESSINGS) ×1 IMPLANT
DIFFUSER DRILL AIR PNEUMATIC (MISCELLANEOUS) ×2 IMPLANT
DRAPE C-ARM 42X72 X-RAY (DRAPES) ×4 IMPLANT
DRAPE HALF SHEET 40X57 (DRAPES) IMPLANT
DRAPE LAPAROTOMY 100X72 PEDS (DRAPES) ×2 IMPLANT
DRAPE MICROSCOPE LEICA (MISCELLANEOUS) ×2 IMPLANT
DRSG OPSITE 4X5.5 SM (GAUZE/BANDAGES/DRESSINGS) ×4 IMPLANT
DRSG OPSITE POSTOP 4X6 (GAUZE/BANDAGES/DRESSINGS) ×2 IMPLANT
DURAPREP 6ML APPLICATOR 50/CS (WOUND CARE) ×2 IMPLANT
ELECT COATED BLADE 2.86 ST (ELECTRODE) ×2 IMPLANT
ELECT REM PT RETURN 9FT ADLT (ELECTROSURGICAL) ×2
ELECTRODE REM PT RTRN 9FT ADLT (ELECTROSURGICAL) ×1 IMPLANT
GAUZE 4X4 16PLY RFD (DISPOSABLE) IMPLANT
GLOVE BIO SURGEON STRL SZ7.5 (GLOVE) ×2 IMPLANT
GLOVE BIOGEL PI IND STRL 7.5 (GLOVE) ×2 IMPLANT
GLOVE BIOGEL PI INDICATOR 7.5 (GLOVE) ×2
GLOVE ECLIPSE 7.0 STRL STRAW (GLOVE) ×2 IMPLANT
GOWN STRL REUS W/ TWL LRG LVL3 (GOWN DISPOSABLE) ×4 IMPLANT
GOWN STRL REUS W/ TWL XL LVL3 (GOWN DISPOSABLE) IMPLANT
GOWN STRL REUS W/TWL 2XL LVL3 (GOWN DISPOSABLE) IMPLANT
GOWN STRL REUS W/TWL LRG LVL3 (GOWN DISPOSABLE) ×4
GOWN STRL REUS W/TWL XL LVL3 (GOWN DISPOSABLE)
HEMOSTAT POWDER KIT SURGIFOAM (HEMOSTASIS) ×2 IMPLANT
KIT BASIN OR (CUSTOM PROCEDURE TRAY) ×2 IMPLANT
KIT TURNOVER KIT B (KITS) ×2 IMPLANT
NEEDLE HYPO 22GX1.5 SAFETY (NEEDLE) ×2 IMPLANT
NEEDLE SPNL 22GX3.5 QUINCKE BK (NEEDLE) ×2 IMPLANT
NS IRRIG 1000ML POUR BTL (IV SOLUTION) ×2 IMPLANT
OIL CARTRIDGE MAESTRO DRILL (MISCELLANEOUS) ×2
PACK LAMINECTOMY NEURO (CUSTOM PROCEDURE TRAY) ×2 IMPLANT
PAD ARMBOARD 7.5X6 YLW CONV (MISCELLANEOUS) ×6 IMPLANT
PLATE ZEVO 1LVL 19MM (Plate) ×2 IMPLANT
RUBBERBAND STERILE (MISCELLANEOUS) ×4 IMPLANT
SCREW 13MM (Screw) ×8 IMPLANT
SPONGE INTESTINAL PEANUT (DISPOSABLE) ×2 IMPLANT
SPONGE SURGIFOAM ABS GEL SZ50 (HEMOSTASIS) ×2 IMPLANT
STAPLER VISISTAT 35W (STAPLE) ×2 IMPLANT
STRIP CLOSURE SKIN 1/2X4 (GAUZE/BANDAGES/DRESSINGS) IMPLANT
SUT VIC AB 3-0 SH 8-18 (SUTURE) ×2 IMPLANT
SUT VICRYL 3-0 RB1 18 ABS (SUTURE) ×4 IMPLANT
TAPE CLOTH 3X10 TAN LF (GAUZE/BANDAGES/DRESSINGS) ×2 IMPLANT
TOWEL GREEN STERILE (TOWEL DISPOSABLE) ×2 IMPLANT
TOWEL GREEN STERILE FF (TOWEL DISPOSABLE) ×2 IMPLANT
WATER STERILE IRR 1000ML POUR (IV SOLUTION) ×2 IMPLANT

## 2018-03-03 NOTE — Plan of Care (Signed)
progressing 

## 2018-03-03 NOTE — Op Note (Signed)
NEUROSURGERY OPERATIVE NOTE   PREOP DIAGNOSIS: Cervical stenosis with myelopathy, C5-6  POSTOP DIAGNOSIS: Same  PROCEDURE: 1. Discectomy at C5-6 for decompression of spinal cord and exiting nerve roots  2. Placement of intervertebral biomechanical device Medtronic PTC PEEK 95m lordotic cage 3. Placement of anterior instrumentation consisting of interbody plate and screws - Medtronic Zevo plate, 152mscrews  4. Use of morselized bone allograft  5. Arthrodesis C5-6, anterior interbody technique  6. Use of intraoperative microscope  SURGEON: Dr. NeConsuella LoseMD  ASSISTANT: ViFerne ReusPA-C  ANESTHESIA: General Endotracheal  EBL: 150cc  SPECIMENS: None  DRAINS: None  COMPLICATIONS: None immediate  CONDITION: Hemodynamically stable to PACU  HISTORY: Damon [name] a [age].o. y.o. male who initially was seen as an inpatient consultation for cervical myelopathy.  He initially presented with bilateral lower extremity weakness and gait instability.  His MRI demonstrated significant disc herniation at C5-6 with spinal cord compression and intrinsic T2 signal change at this level.  With his clinical and radiographic findings, surgical decompression and fusion was indicated.  The risks and benefits of the surgery were explained in detail with the patient and his family through a sign language interpreter.  After all questions were answered informed consent was obtained and witnessed.  PROCEDURE IN DETAIL: The patient was brought to the operating room and transferred to the operative table. After induction of general anesthesia, the patient was positioned on the operative table in the supine position with all pressure points meticulously padded. The skin of the neck was then prepped and draped in the usual sterile fashion.  After timeout was conducted, the skin was infiltrated with local anesthetic. Skin incision was then made sharply and Bovie electrocautery was used to  dissect the subcutaneous tissue until the platysma was identified. The platysma was then divided and undermined. The sternocleidomastoid muscle was then identified and, utilizing natural fascial planes in the neck, the prevertebral fascia was identified and the carotid sheath was retracted laterally and the trachea and esophagus retracted medially. Spinal needle was introduced and intraoperative fluoroscopy demonstrated position at the C3-4 interspace.  I therefore dissected more inferiorly to identify the C5-6 interspace.  Spinal needle was again inserted and our level was confirmed. Bovie electrocautery was used to dissect in the subperiosteal plane and elevate the bilateral longus coli muscles.  Table mounted retractors were then placed. At this point, the microscope was draped and brought into the field, and the remainder of the case was done under the microscope using microdissecting technique.  The disc space was incised sharply and rongeurs were use to initially complete a discectomy. The high-speed drill was then used to complete discectomy until the posterior annulus was identified and removed and the posterior longitudinal ligament was identified. Using a nerve hook, the PLL was elevated, and Kerrison rongeurs were used to remove the posterior longitudinal ligament and the ventral thecal sac was identified. Using a combination of curettes and rogneurs, complete decompression of the thecal sac and exiting nerve roots at this level was completed, and verified using micro-nerve hook.  I did note significant thickening of the posterior longitudinal ligament as well as posterior subligamentous disc herniation.  Once decompression was completed I was able to freely pass a small dissector in the ventral epidural space indicating good decompression.  Having completed our decompression, attention was turned to placement of the intervertebral device. Trial spacers were used to select a 6 mm lordotic graft. This  graft was then filled with morcellized  allograft, and inserted.  After placement of the intervertebral device, the above anterior cervical plate was selected, and placed across the interspace. Using a high-speed drill, the cortex of the cervical vertebral bodies was punctured, and screws inserted in the level above and below. Final fluoroscopic images in lateral projection was taken to confirm good hardware placement.  At this point, after all counts were verified to be correct, meticulous hemostasis was secured using a combination of bipolar electrocautery and passive hemostatics. The platysma muscle was then closed using interrupted 3-0 Vicryl sutures, and the skin was closed with an interrupted 3-0 Vicryl subcuticular stitch. Dermabond and sterile dressings were then applied and the drapes removed.  The patient tolerated the procedure well and was extubated in the room and taken to the postanesthesia care unit in stable condition.

## 2018-03-03 NOTE — Progress Notes (Signed)
PROGRESS NOTE                                                                                                                                                                                                             Patient Demographics:    Damon [name], is a 52 y.o. male, DOB - [birth year], HWE:[id number]  Admit date - 02/26/2018   Admitting Physician Ivor Costa, MD  Outpatient Primary MD for the patient is [name], DO  LOS - 4  Outpatient Specialists: None  Chief Complaint  Patient presents with  . Near Syncope  . Weakness       Brief Narrative 52 year old male with history of traumatic brain injury, seizures, deafness, depression, hypothyroidism, GERD and MDP presenting with left leg shaking and poor balance.  MRI of the cervical spine showed significant cord compression at the level of C5-C6.  Seen by neurosurgery with plan on surgical intervention on 12/31.   Subjective:  Seen after returning from OR. o2 sats occasionally dropping to high 80s,denies any pain at present   Assessment  & Plan :    Principal Problem: Acute gait instability with C5-C6 cord compression Underwent C5-6 discectomy for decompression of spinal cord. placement of intervertebral biomechanical device Medtronic PTC PEEK 52m lordotic cage. Tolerated surgery well. PT eval. Further recs per neurosurgery. Noted for low B12 (115).  Given subcu dose on 12/30.  Active Problems: Seizure disorder ([place] EEG without seizure activity.  Continue Dilantin.     Hypothyroidism Continue Synthroid.  TSH normal.  History of depression Continue Prozac  Constipation Continue senna, Colace and MiraLAX.  GERD Continue PPI     TBI (traumatic brain injury) ([place]      Code Status : Full code  Family Communication  : Mother at bedside  Disposition Plan  : Pending further neurosx eval and PT  Barriers For Discharge : Active  symptoms  Consults  : Neurosurgery, neurology  Procedures  : EEG, MRI brain, cervical and thoracic spine CT angiogram of the head and neck, Cervical dissection and intervertebral Medtronic device placement  DVT Prophylaxis  :  Lovenox   Lab Results  Component Value Date   PLT 195 03/01/2018    Antibiotics  :  Anti-infectives (From admission, onward)   Start     Dose/Rate Route Frequency Ordered Stop   03/03/18 1600  ceFAZolin (ANCEF) IVPB 2g/100 mL premix     2 g 200 mL/hr over 30 Minutes Intravenous Every 8 hours 03/03/18 1048 03/04/18 0759   03/03/18 1048  ceFAZolin (ANCEF) IVPB 2g/100 mL premix  Status:  Discontinued     2 g 200 mL/hr over 30 Minutes Intravenous 30 min pre-op 03/03/18 1048 03/03/18 1113   03/03/18 0736  bacitracin 50,000 Units in sodium chloride 0.9 % 500 mL irrigation  Status:  Discontinued       As needed 03/03/18 0736 03/03/18 0957        Objective:   Vitals:   03/03/18 1030 03/03/18 1031 03/03/18 1032 03/03/18 1116  BP:   (!) 146/85 131/79  Pulse: 88 84 90 85  Resp: _0 Temp: 98.1 F (36.7 C)   (!) 97.5 F (36.4 C)  TempSrc:    Axillary  SpO2: 92% 95% 90% 92%  Weight:      Height:        Wt Readings from Last 3 Encounters:  02/27/18 65.9 kg  02/16/18 67.6 kg  01/13/18 65.5 kg     Intake/Output Summary (Last 24 hours) at 03/03/2018 1241 Last data filed at 03/03/2018 1047 Gross per 24 hour  Intake 1690 ml  Output 800 ml  Net 890 ml    Physical exam  NAD HEENT: clean dressing over anetrior neck, moist mucosa Chest : cleatr, b/l CVS: NS1&S2 Gi: soft, NT, ND  musculoskeletal: warm, no edema  CNS: alert and awake, spasticity over LE    Data Review:    CBC Recent Labs  Lab 02/26/18 1216 03/01/18 1004  WBC 5.7 5.5  HGB 14.3 15.3  HCT 45.0 45.5  PLT 193 195  MCV 93.0 91.4  MCH 29.5 30.7  MCHC 31.8 33.6  RDW 12.7 12.8  LYMPHSABS 1.1  --   MONOABS 0.4  --   EOSABS 0.1  --   BASOSABS 0.0  --      Chemistries  Recent Labs  Lab 02/26/18 1216 03/01/18 1004  NA 138 138  K 3.7 3.8  CL 102 102  CO2 30 27  GLUCOSE 76 54*  BUN 7 9  CREATININE 1.07 0.97  CALCIUM 8.6* 8.9  AST 27  --   ALT 22  --   ALKPHOS 67  --   BILITOT 0.5  --    ------------------------------------------------------------------------------------------------------------------ No results for input(s): CHOL, HDL, LDLCALC, TRIG, CHOLHDL, LDLDIRECT in the last 72 hours.  Lab Results  Component Value Date   HGBA1C 4.4 (L) 02/26/2018   ------------------------------------------------------------------------------------------------------------------ Recent Labs    03/01/18 1004  TSH 3.777   ------------------------------------------------------------------------------------------------------------------ No results for input(s): VITAMINB12, FOLATE, FERRITIN, TIBC, IRON, RETICCTPCT in the last 72 hours.  Coagulation profile Recent Labs  Lab 02/26/18 1216  INR 1.06    No results for input(s): DDIMER in the last 72 hours.  Cardiac Enzymes Recent Labs  Lab 02/26/18 1329  TROPONINI <0.03   ------------------------------------------------------------------------------------------------------------------ No results found for: BNP  Inpatient Medications  Scheduled Meds: . acetaminophen  1,000 mg Oral Q6H  . aspirin  300 mg Rectal Daily   Or  . aspirin  325 mg Oral Daily  . docusate sodium  100 mg Oral BID  . enoxaparin (LOVENOX) injection  40 mg Subcutaneous Q24H  . famotidine  20 mg Oral QHS  . FLUoxetine  20 mg Oral Daily  . gabapentin  300 mg Oral TID  . levothyroxine  75 mcg Oral Q0600  . pantoprazole  20 mg  Oral BID AC  . phenytoin  100 mg Oral Q48H  . phenytoin  200 mg Oral Daily  . phenytoin  200 mg Oral Q48H  . polyethylene glycol  17 g Oral Daily  . senna  1 tablet Oral BID  . sodium chloride flush  3 mL Intravenous Q12H   Continuous Infusions: . sodium chloride    . sodium  chloride Stopped (03/03/18 1106)  .  ceFAZolin (ANCEF) IV    . methocarbamol (ROBAXIN) IV    . thiamine injection Stopped (03/02/18 1134)   PRN Meds:.acetaminophen **OR** acetaminophen, albuterol, bisacodyl, dextromethorphan-guaiFENesin, HYDROcodone-acetaminophen, HYDROmorphone (DILAUDID) injection, LORazepam, menthol-cetylpyridinium **OR** phenol, methocarbamol **OR** methocarbamol (ROBAXIN) IV, ondansetron **OR** ondansetron (ZOFRAN) IV, oxyCODONE, senna-docusate, sodium chloride flush, sodium phosphate  Micro Results Recent Results (from the past 240 hour(s))  MRSA PCR Screening     Status: None   Collection Time: 03/02/18  8:41 PM  Result Value Ref Range Status   MRSA by PCR NEGATIVE NEGATIVE Final    Comment:        The GeneXpert MRSA Assay (FDA approved for NASAL specimens only), is one component of a comprehensive MRSA colonization surveillance program. It is not intended to diagnose MRSA infection nor to guide or monitor treatment for MRSA infections. Performed at [facility], [place] [address], [place], [place] [address]     Radiology Reports Ct Angio Head W Or Wo Contrast  Result Date: 02/26/2018 CLINICAL DATA:  52 year old male with left leg shaking and weakness onset today. Prior left MCA infarct. EXAM: CT ANGIOGRAPHY HEAD AND NECK TECHNIQUE: Multidetector CT imaging of the head and neck was performed using the standard protocol during bolus administration of intravenous contrast. Multiplanar CT image reconstructions and MIPs were obtained to evaluate the vascular anatomy. Carotid stenosis measurements (when applicable) are obtained utilizing NASCET criteria, using the distal internal carotid diameter as the denominator. CONTRAST:  151m ISOVUE-370 IOPAMIDOL (ISOVUE-370) INJECTION 76% COMPARISON:  Head CT without contrast 1317 hours today. FINDINGS: CTA NECK Skeleton: Postoperative changes to the anterior mandible. Cervical spine dextroconvex scoliosis and  cervicothoracic junction ankylosis. No acute osseous abnormality identified. Upper chest: Negative. Other neck: Thyromegaly. The largest thyroid nodule by CT is 11 millimeters which does not meet size criteria for follow-up. Otherwise negative neck soft tissues. Aortic arch: 3 vessel arch configuration with minimal arch atherosclerosis. Right carotid system: Negative. Left carotid system: Negative. Mildly tortuous left ICA just below the skull base. Vertebral arteries: Normal proximal right subclavian artery and right vertebral artery origin. The right vertebral is non dominant and diminutive but patent to the skull base without stenosis. No proximal left subclavian artery or left vertebral artery origin plaque or stenosis. The left vertebral origin is somewhat shared with a left thyrocervical trunk (series 7, image 282, normal variant). The left vertebral is dominant and patent to the skull base without stenosis. CTA HEAD Posterior circulation: There is mild to moderate irregularity and stenosis of the dominant left vertebral artery as it crosses the dura on series 11, image 29. No other distal vertebral stenosis. The left PICA origin is patent. There is similar mild irregularity and stenosis of the distal right vertebral artery as it crosses the dura. The right V4 segment appears occluded distal to the patent right PICA origin. The left vertebral supplies the basilar. Patent basilar artery without stenosis. Normal SCA and PCA origins. Fetal type left PCA origin. Bilateral PCA branches are within normal limits. Anterior circulation: Both ICA siphons are patent. The left siphon appears  somewhat dominant with mild calcified plaque at the anterior genu but no stenosis. The right siphon is non dominant and the right supraclinoid segment appears somewhat tapered and diminutive without focal stenosis (series 9 image 88). Patent carotid termini. The left ACA A1 segment is dominant and the right A1 is diminutive. ACA  origins, anterior communicating artery and bilateral ACA branches are within normal limits. The left MCA origin is mildly irregular without stenosis. The left M1 and bifurcation are patent without stenosis. The left MCA branches appear mildly irregular. The right MCA origin and M1 segment are patent without stenosis. The right MCA bifurcation and M2 segments are patent without stenosis. No right MCA branch occlusion is identified. There is asymmetric elevation of the left MCA branches owing to the left hemisphere encephalomalacia. Venous sinuses: Patent on the delayed images. Anatomic variants: Dominant left vertebral artery. Fetal type left PCA origin. Delayed phase: No abnormal enhancement identified. Stable supratentorial gray-white matter differentiation throughout the brain. There is suggestion of linear hypodensity in the right pons now on series 13, image 10. No associated hemorrhage or mass effect. Review of the MIP images confirms the above findings IMPRESSION: 1. Negative for large vessel occlusion in the anterior circulation but positive for occlusion of the non-dominant distal Right Vertebral Artery just beyond the right PICA origin which is age indeterminate. 2. Delayed images suggest a linear lacunar infarct in the right pons. No associated hemorrhage or mass effect. 3. No atherosclerosis in the neck, but up to moderate stenosis of the dominant Left Vertebral Artery in the posterior fossa as it crosses the dura. 4. Dominant left ICA siphon with fetal type left PCA origin. The right supraclinoid ICA is diminutive but without focal stenosis. 5. Chronic Left MCA infarct with mild irregularity of the left MCA origin and left MCA branches without stenosis. Electronically Signed   By: Genevie Ann M.D.   On: 02/26/2018 16:50   Dg Chest 2 View  Result Date: 02/26/2018 CLINICAL DATA:  Mitral valve prolapse.  Tremors EXAM: CHEST - 2 VIEW COMPARISON:  May 13, 2017. FINDINGS: There is no evident edema or  consolidation. The heart is upper normal in size with pulmonary vascularity normal. No adenopathy. No bone lesions. No radiopaque foreign bodies beyond overlying monitor leads. IMPRESSION: No edema or consolidation.  Stable cardiac silhouette. Electronically Signed   By: Lowella Grip III M.D.   On: 02/26/2018 16:20   Dg Cervical Spine 1 View  Result Date: 03/03/2018 CLINICAL DATA:  ACDF. EXAM: DG C-ARM 61-120 MIN; DG CERVICAL SPINE - 1 VIEW COMPARISON:  MRI 02/27/2018 FINDINGS: Changes of anterior fusion at C5-6. Normal alignment. No hardware complicating feature. IMPRESSION: ACDF C5-6.  No visible complicating feature. Electronically Signed   By: Rolm Baptise M.D.   On: 03/03/2018 11:41   Ct Head Wo Contrast  Result Date: 02/26/2018 CLINICAL DATA:  LEFT leg weakness and dizziness for 1 day. EXAM: CT HEAD WITHOUT CONTRAST TECHNIQUE: Contiguous axial images were obtained from the base of the skull through the vertex without intravenous contrast. COMPARISON:  None. FINDINGS: Brain: No definite acute stroke, acute hemorrhage, mass lesion, hydrocephalus, or extra-axial fluid. Advanced cerebellar atrophy. Extensive encephalomalacia LEFT hemisphere, frontotemporal, related to old stroke. Compensatory enlargement LEFT lateral ventricle. Mild to moderate premature cerebral atrophy. Hypoattenuation of white matter, likely small vessel disease. Vascular: Calcification of the cavernous internal carotid arteries consistent with cerebrovascular atherosclerotic disease. No emergent large vessel occlusion is evident. Skull: Normal. Negative for fracture or focal lesion. Sinuses/Orbits: No  acute finding. Other: None. IMPRESSION: Chronic changes as described. Old LEFT MCA territory infarct. Cerebellar greater than cerebral atrophy. Small vessel disease. No acute intracranial findings. Electronically Signed   By: Staci Righter M.D.   On: 02/26/2018 13:33   Ct Angio Neck W And/or Wo Contrast  Result Date:  02/26/2018 CLINICAL DATA:  52 year old male with left leg shaking and weakness onset today. Prior left MCA infarct. EXAM: CT ANGIOGRAPHY HEAD AND NECK TECHNIQUE: Multidetector CT imaging of the head and neck was performed using the standard protocol during bolus administration of intravenous contrast. Multiplanar CT image reconstructions and MIPs were obtained to evaluate the vascular anatomy. Carotid stenosis measurements (when applicable) are obtained utilizing NASCET criteria, using the distal internal carotid diameter as the denominator. CONTRAST:  136m ISOVUE-370 IOPAMIDOL (ISOVUE-370) INJECTION 76% COMPARISON:  Head CT without contrast 1317 hours today. FINDINGS: CTA NECK Skeleton: Postoperative changes to the anterior mandible. Cervical spine dextroconvex scoliosis and cervicothoracic junction ankylosis. No acute osseous abnormality identified. Upper chest: Negative. Other neck: Thyromegaly. The largest thyroid nodule by CT is 11 millimeters which does not meet size criteria for follow-up. Otherwise negative neck soft tissues. Aortic arch: 3 vessel arch configuration with minimal arch atherosclerosis. Right carotid system: Negative. Left carotid system: Negative. Mildly tortuous left ICA just below the skull base. Vertebral arteries: Normal proximal right subclavian artery and right vertebral artery origin. The right vertebral is non dominant and diminutive but patent to the skull base without stenosis. No proximal left subclavian artery or left vertebral artery origin plaque or stenosis. The left vertebral origin is somewhat shared with a left thyrocervical trunk (series 7, image 282, normal variant). The left vertebral is dominant and patent to the skull base without stenosis. CTA HEAD Posterior circulation: There is mild to moderate irregularity and stenosis of the dominant left vertebral artery as it crosses the dura on series 11, image 29. No other distal vertebral stenosis. The left PICA origin is  patent. There is similar mild irregularity and stenosis of the distal right vertebral artery as it crosses the dura. The right V4 segment appears occluded distal to the patent right PICA origin. The left vertebral supplies the basilar. Patent basilar artery without stenosis. Normal SCA and PCA origins. Fetal type left PCA origin. Bilateral PCA branches are within normal limits. Anterior circulation: Both ICA siphons are patent. The left siphon appears somewhat dominant with mild calcified plaque at the anterior genu but no stenosis. The right siphon is non dominant and the right supraclinoid segment appears somewhat tapered and diminutive without focal stenosis (series 9 image 88). Patent carotid termini. The left ACA A1 segment is dominant and the right A1 is diminutive. ACA origins, anterior communicating artery and bilateral ACA branches are within normal limits. The left MCA origin is mildly irregular without stenosis. The left M1 and bifurcation are patent without stenosis. The left MCA branches appear mildly irregular. The right MCA origin and M1 segment are patent without stenosis. The right MCA bifurcation and M2 segments are patent without stenosis. No right MCA branch occlusion is identified. There is asymmetric elevation of the left MCA branches owing to the left hemisphere encephalomalacia. Venous sinuses: Patent on the delayed images. Anatomic variants: Dominant left vertebral artery. Fetal type left PCA origin. Delayed phase: No abnormal enhancement identified. Stable supratentorial gray-white matter differentiation throughout the brain. There is suggestion of linear hypodensity in the right pons now on series 13, image 10. No associated hemorrhage or mass effect. Review of the MIP images  confirms the above findings IMPRESSION: 1. Negative for large vessel occlusion in the anterior circulation but positive for occlusion of the non-dominant distal Right Vertebral Artery just beyond the right PICA origin  which is age indeterminate. 2. Delayed images suggest a linear lacunar infarct in the right pons. No associated hemorrhage or mass effect. 3. No atherosclerosis in the neck, but up to moderate stenosis of the dominant Left Vertebral Artery in the posterior fossa as it crosses the dura. 4. Dominant left ICA siphon with fetal type left PCA origin. The right supraclinoid ICA is diminutive but without focal stenosis. 5. Chronic Left MCA infarct with mild irregularity of the left MCA origin and left MCA branches without stenosis. Electronically Signed   By: Genevie Ann M.D.   On: 02/26/2018 16:50   Mr Brain Wo Contrast  Result Date: 02/26/2018 CLINICAL DATA:  Initial evaluation for acute left lower extremity shaking, gait instability. EXAM: MRI HEAD WITHOUT CONTRAST TECHNIQUE: Multiplanar, multiecho pulse sequences of the brain and surrounding structures were obtained without intravenous contrast. COMPARISON:  Prior CT and CTA from earlier the same day. FINDINGS: Brain: Moderately advanced cerebral and cerebellar atrophy with prominent vermian atrophy noted. Large area of cystic encephalomalacia involving the peripheral left frontotemporal region, likely related to history of remote traumatic brain injury. Scattered chronic hemosiderin staining present within this region. Chronic hemosiderin staining also noted at the cerebellar vermis. No abnormal foci of restricted diffusion to suggest acute or subacute ischemia. Gray-white matter differentiation maintained. No other areas of remote cortical infarction. No evidence for acute intracranial hemorrhage. No mass lesion, midline shift or mass effect. No hydrocephalus. No extra-axial fluid collection. Pituitary gland normal. Vascular: Probable occluded right V4 segment, also seen on prior CTA. Major intracranial vascular flow voids otherwise maintained. Skull and upper cervical spine: Craniocervical junction normal. Bone marrow signal intensity within normal limits. No scalp  soft tissue abnormality. Sinuses/Orbits: Globes and orbital soft tissues within normal limits. Paranasal sinuses are largely clear. Trace right mastoid effusion noted, of doubtful significance. Inner ear structures grossly normal. Other: None. IMPRESSION: 1. No acute intracranial abnormality. 2. Large area of cystic encephalomalacia involving the peripheral left frontotemporal region, most likely related to history of traumatic brain injury. 3. Moderately advanced cerebral and cerebellar atrophy, with prominent vermian atrophy. Electronically Signed   By: Jeannine Boga M.D.   On: 02/26/2018 19:34   Mr Cervical Spine Wo Contrast  Result Date: 02/27/2018 CLINICAL DATA:  Initial evaluation for acute myelopathy. EXAM: MRI CERVICAL SPINE WITHOUT CONTRAST TECHNIQUE: Multiplanar, multisequence MR imaging of the cervical spine was performed. No intravenous contrast was administered. COMPARISON:  None available. FINDINGS: Alignment: Examination technically limited as the patient was unable to tolerate the full length of the exam. Sagittal T1, T2, and STIR sequences only were performed. No axial images are provided. Straightening of the normal cervical lordosis. Trace 2 mm retrolisthesis of C5 on C6, likely chronic and facet mediated. Vertebrae: Vertebral body heights maintained without evidence for acute or chronic fracture. Bone marrow signal intensity within normal limits. No discrete or worrisome osseous lesions. Reactive marrow edema about the left C2-3 facet due to facet arthritis (series 5, image 18). No other abnormal marrow edema. Cord: Patchy T2 signal abnormality within the cervical spinal cord at C5-6 due to stenosis, compatible with edema and/or myelomalacia (series 3, image 9). Signal intensity within the cervical spinal cord otherwise grossly normal on this limited exam. Posterior Fossa, vertebral arteries, paraspinal tissues: Partially visualized brain and posterior fossa demonstrate no  acute  finding. Craniocervical junction normal. Paraspinous and prevertebral soft tissues grossly within normal limits. Probable enlarged multinodular goiter noted. Disc levels: C2-C3: No significant disc pathology. Left-sided facet arthrosis. No significant canal stenosis. C3-C4: No significant disc pathology. No significant spinal stenosis. C4-C5: No significant disc pathology. No significant spinal stenosis. C5-C6: Circumferential disc osteophyte with intervertebral disc space narrowing. Resultant severe spinal stenosis with impingement of the cervical spinal cord and cord signal changes. Thecal sac measures approximately 4 mm in AP diameter at its most narrow point. C6-C7: Mild annular disc bulge with disc desiccation. No significant spinal stenosis. C7-T1: C7 and T1 vertebral bodies are partially ankylosed. No significant stenosis. Visualized upper thoracic spine within normal limits. IMPRESSION: 1. Technically limited examination due to patient's inability to tolerate the full length of the study. Sagittal sequences only were performed. 2. Degenerative disc osteophyte at C5-6 with resultant severe spinal stenosis with impingement of the cervical spinal cord. Associated cord signal abnormality at this level compatible with edema and/or myelomalacia. 3. Advanced left-sided facet hypertrophy at C2-3 with associated reactive marrow edema. Finding could contribute to neck pain. Electronically Signed   By: Jeannine Boga M.D.   On: 02/27/2018 17:43   Mr Thoracic Spine Wo Contrast  Result Date: 02/28/2018 CLINICAL DATA:  Balance disturbance and left lower extremity tremor. EXAM: MRI THORACIC SPINE WITHOUT CONTRAST TECHNIQUE: Multiplanar, multisequence MR imaging of the thoracic spine was performed. No intravenous contrast was administered. COMPARISON:  None. FINDINGS: Alignment:  Physiologic. Vertebrae: No fracture, evidence of discitis, or bone lesion. Cord:  Normal signal and morphology. Paraspinal and other  soft tissues: Negative. Disc levels: The axial images are motion degraded. T5-6: Small central disc protrusion with mild spinal canal stenosis. Right-greater-than-left facet hypertrophy. T12-L1: Left-greater-than-right facet hypertrophy. No spinal canal stenosis. There is no other significant disc herniation or spinal stenosis. IMPRESSION: 1. No acute abnormality of the thoracic spine. 2. Small central disc protrusion at T5-6 with mild spinal canal stenosis. Electronically Signed   By: Ulyses Jarred M.D.   On: 02/28/2018 21:56   Dg C-arm 1-60 Min  Result Date: 03/03/2018 CLINICAL DATA:  ACDF. EXAM: DG C-ARM 61-120 MIN; DG CERVICAL SPINE - 1 VIEW COMPARISON:  MRI 02/27/2018 FINDINGS: Changes of anterior fusion at C5-6. Normal alignment. No hardware complicating feature. IMPRESSION: ACDF C5-6.  No visible complicating feature. Electronically Signed   By: Rolm Baptise M.D.   On: 03/03/2018 11:41    Time Spent in minutes  25   Nathanal Hermiz M.D on 03/03/2018 at 12:41 PM  Between 7am to 7pm - Pager - [phone]  After 7pm go to [email or web address] - password [facility]  [facility] -  Office  [phone]

## 2018-03-03 NOTE — Progress Notes (Signed)
Patient back from OR, dressing clean dry and intact

## 2018-03-03 NOTE — Transfer of Care (Signed)
Immediate Anesthesia Transfer of Care Note  Patient: Damon [name].  Procedure(s) Performed: ANTERIOR CERVICAL DECOMPRESSION/DISCECTOMY FUSION ONE LEVEL (N/A Spine Cervical)  Patient Location: PACU  Anesthesia Type:General  Level of Consciousness: awake, alert  and oriented  Airway & Oxygen Therapy: Patient Spontanous Breathing and Patient connected to nasal cannula oxygen  Post-op Assessment: Report given to RN, Post -op Vital signs reviewed and stable and Patient moving all extremities X 4  Post vital signs: Reviewed and stable  Last Vitals:  Vitals Value Taken Time  BP 138/76 03/03/2018 10:02 AM  Temp    Pulse 87 03/03/2018 10:05 AM  Resp 14 03/03/2018 10:05 AM  SpO2 97 % 03/03/2018 10:05 AM  Vitals shown include unvalidated device data.  Last Pain:  Vitals:   03/03/18 0600  TempSrc:   PainSc: 0-No pain      Patients Stated Pain Goal: 0 (75/[id number])  Complications: No apparent anesthesia complications

## 2018-03-03 NOTE — Anesthesia Postprocedure Evaluation (Signed)
Anesthesia Post Note  Patient: Damon [name].  Procedure(s) Performed: ANTERIOR CERVICAL DECOMPRESSION/DISCECTOMY FUSION ONE LEVEL (N/A Spine Cervical)     Patient location during evaluation: PACU Anesthesia Type: General Level of consciousness: awake and alert Pain management: pain level controlled Vital Signs Assessment: post-procedure vital signs reviewed and stable Respiratory status: spontaneous breathing, nonlabored ventilation, respiratory function stable and patient connected to nasal cannula oxygen Cardiovascular status: blood pressure returned to baseline and stable Postop Assessment: no apparent nausea or vomiting Anesthetic complications: no    Last Vitals:  Vitals:   03/03/18 1116 03/03/18 1510  BP: 131/79 (!) 147/87  Pulse: 85 84  Resp: 18 18  Temp: (!) 36.4 C 36.9 C  SpO2: 92% 98%    Last Pain:  Vitals:   03/03/18 1510  TempSrc: Axillary  PainSc:                  [name]

## 2018-03-03 NOTE — Anesthesia Procedure Notes (Signed)
Procedure Name: Intubation Date/Time: 03/03/2018 7:58 AM Performed by: Gaylene Brooks, CRNA Pre-anesthesia Checklist: Patient identified, Emergency Drugs available, Suction available and Patient being monitored Patient Re-evaluated:Patient Re-evaluated prior to induction Oxygen Delivery Method: Circle System Utilized Preoxygenation: Pre-oxygenation with 100% oxygen Induction Type: IV induction Ventilation: Two handed mask ventilation required and Mask ventilation without difficulty Laryngoscope Size: Glidescope and 3 Grade View: Grade I Tube type: Oral Tube size: 7.5 mm Number of attempts: 1 Airway Equipment and Method: Stylet,  Oral airway and Video-laryngoscopy Placement Confirmation: ETT inserted through vocal cords under direct vision,  positive ETCO2 and breath sounds checked- equal and bilateral Secured at: 22 cm Tube secured with: Tape Dental Injury: Teeth and Oropharynx as per pre-operative assessment  Comments: Glidescope used due to cervical instability. Easy intubation. Mask airway somewhat difficult due to very full beard. Succinylcholine used.

## 2018-03-03 NOTE — Anesthesia Preprocedure Evaluation (Addendum)
Anesthesia Evaluation  Patient identified by MRN, date of birth, ID band Patient awake    Reviewed: Allergy & Precautions, NPO status , Patient's Chart, lab work & pertinent test results  Airway Mallampati: III  TM Distance: >3 FB Neck ROM: Full    Dental  (+) Teeth Intact, Dental Advisory Given   Pulmonary asthma ,    breath sounds clear to auscultation       Cardiovascular negative cardio ROS   Rhythm:Regular Rate:Normal     Neuro/Psych Seizures -,  Anxiety Depression TIA   GI/Hepatic Neg liver ROS, GERD  Medicated,  Endo/Other  Hypothyroidism   Renal/GU negative Renal ROS     Musculoskeletal negative musculoskeletal ROS (+)   Abdominal Normal abdominal exam  (+)   Peds  Hematology negative hematology ROS (+)   Anesthesia Other Findings   Reproductive/Obstetrics                            Lab Results  Component Value Date   WBC 5.5 03/01/2018   HGB 15.3 03/01/2018   HCT 45.5 03/01/2018   MCV 91.4 03/01/2018   PLT 195 03/01/2018   Lab Results  Component Value Date   CREATININE 0.97 03/01/2018   BUN 9 03/01/2018   NA 138 03/01/2018   K 3.8 03/01/2018   CL 102 03/01/2018   CO2 27 03/01/2018   Lab Results  Component Value Date   INR 1.06 02/26/2018   Echo: - Left ventricle: The cavity size was normal. Wall thickness was   increased in a pattern of moderate LVH. Systolic function was   vigorous. The estimated ejection fraction was in the range of 65%   to 70%. Wall motion was normal; there were no regional wall   motion abnormalities. Features are consistent with a pseudonormal   left ventricular filling pattern, with concomitant abnormal   relaxation and increased filling pressure (grade 2 diastolic   dysfunction). - Aortic valve: Mildly calcified annulus. Trileaflet; normal   thickness leaflets. Transvalvular velocity was within the normal   range. There was no stenosis.  There was no regurgitation. - Mitral valve: There was trivial regurgitation. - Left atrium: The atrium was normal in size. - Right ventricle: The cavity size was normal. Wall thickness was   normal. Systolic function was normal. - Right atrium: The atrium was mildly dilated. - Atrial septum: There was increased thickness of the septum,   consistent with lipomatous hypertrophy. No defect or patent   foramen ovale was identified. - Tricuspid valve: There was trivial regurgitation. - Inferior vena cava: The vessel was normal in size. The   respirophasic diameter changes were in the normal range (>= 50%),   consistent with normal central venous pressure. - Pericardium, extracardiac: There was no pericardial effusion.  EKG: normal sinus rhythm.   Anesthesia Physical Anesthesia Plan  ASA: II  Anesthesia Plan: General   Post-op Pain Management:    Induction: Intravenous  PONV Risk Score and Plan: 3 and Ondansetron, Dexamethasone and Midazolam  Airway Management Planned: Oral ETT and Video Laryngoscope Planned  Additional Equipment: None  Intra-op Plan:   Post-operative Plan: Extubation in OR  Informed Consent: I have reviewed the patients History and Physical, chart, labs and discussed the procedure including the risks, benefits and alternatives for the proposed anesthesia with the patient or authorized representative who has indicated his/her understanding and acceptance.     Plan Discussed with: CRNA  Anesthesia Plan Comments: Public house manager. )  Anesthesia Quick Evaluation  

## 2018-03-04 DIAGNOSIS — R1310 Dysphagia, unspecified: Secondary | ICD-10-CM

## 2018-03-04 DIAGNOSIS — E538 Deficiency of other specified B group vitamins: Secondary | ICD-10-CM

## 2018-03-04 LAB — CBC
HCT: 42 % (ref 39.0–52.0)
Hemoglobin: 13.4 g/dL (ref 13.0–17.0)
MCH: 29.7 pg (ref 26.0–34.0)
MCHC: 31.9 g/dL (ref 30.0–36.0)
MCV: 93.1 fL (ref 80.0–100.0)
Platelets: 135 10*3/uL — ABNORMAL LOW (ref 150–400)
RBC: 4.51 MIL/uL (ref 4.22–5.81)
RDW: 12.9 % (ref 11.5–15.5)
WBC: 8.8 10*3/uL (ref 4.0–10.5)
nRBC: 0 % (ref 0.0–0.2)

## 2018-03-04 LAB — PROTIME-INR
INR: 1.13
Prothrombin Time: 14.4 seconds (ref 11.4–15.2)

## 2018-03-04 LAB — APTT: aPTT: 35 seconds (ref 24–36)

## 2018-03-04 LAB — BASIC METABOLIC PANEL
Anion gap: 8 (ref 5–15)
BUN: 10 mg/dL (ref 6–20)
CO2: 29 mmol/L (ref 22–32)
Calcium: 8.6 mg/dL — ABNORMAL LOW (ref 8.9–10.3)
Chloride: 102 mmol/L (ref 98–111)
Creatinine, Ser: 1.07 mg/dL (ref 0.61–1.24)
GFR calc Af Amer: >60 mL/min (ref >60)
GFR calc non Af Amer: >60 mL/min (ref >60)
Glucose, Bld: 107 mg/dL — ABNORMAL HIGH (ref 70–99)
Potassium: 4.1 mmol/L (ref 3.5–5.1)
Sodium: 139 mmol/L (ref 135–145)

## 2018-03-04 MED ORDER — LORAZEPAM 2 MG/ML IJ SOLN
0.5000 mg | Freq: Once | INTRAMUSCULAR | Status: AC
  Administered 2018-03-04: 0.5 mg via INTRAVENOUS
  Filled 2018-03-04: qty 1

## 2018-03-04 MED ORDER — SODIUM CHLORIDE 0.9 % IV SOLN
200.0000 mg | Freq: Once | INTRAVENOUS | Status: AC
  Administered 2018-03-04: 200 mg via INTRAVENOUS
  Filled 2018-03-04: qty 4

## 2018-03-04 MED ORDER — DEXAMETHASONE SODIUM PHOSPHATE 10 MG/ML IJ SOLN
4.0000 mg | Freq: Four times a day (QID) | INTRAMUSCULAR | Status: AC
  Administered 2018-03-04 – 2018-03-05 (×3): 4 mg via INTRAVENOUS
  Filled 2018-03-04 (×3): qty 1
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
## 2018-03-04 NOTE — Progress Notes (Signed)
PROGRESS NOTE                                                                                                                                                                                                             Patient Demographics:    Damon [name], is a 53 y.o. male, DOB - 1965-06-26, ELF:[id number]  Admit date - 02/26/2018   Admitting Physician Damon Costa, MD  Outpatient Primary MD for the patient is Damon Dance, DO  LOS - 5  Outpatient Specialists: None  Chief Complaint  Patient presents with  . Near Syncope  . Weakness       Brief Narrative 53 year old male with history of traumatic brain injury, seizures, deafness, depression, hypothyroidism, GERD and MDP presenting with left leg shaking and poor balance.  MRI of the cervical spine showed significant cord compression at the level of C5-C6.  Seen by neurosurgery with plan on surgical intervention on 12/31.   Subjective:   Patient having difficulty swallowing this morning and coughing frequently.  Afebrile.  No stridor or wheezing.   Assessment  & Plan :    Principal Problem: Acute gait instability with C5-C6 cord compression Underwent C5-6 discectomy for decompression of spinal cord. placement of intervertebral biomechanical device Medtronic PTC PEEK 60m lordotic cage. Tolerated surgery well. PT eval. Further recs per neurosurgery. Noted for low B12 (115).  Given subcu dose on 12/30. This morning (postop day 2) patient having difficulty swallowing (could not keep the toast down) when his neck does appear swollen from yesterday.  No stridor or wheezing.  No hematoma over the dressing. Seen by neurosurgery this morning.  Was consulted back by the nurse and ordered Decadron.  Continue pain control.  Will monitor closely. Ordered swallow evaluation.  Active Problems: Seizure disorder ([facility] without seizure activity.  Continue Dilantin.    Hypothyroidism Continue Synthroid.  TSH normal.  History of depression Continue Prozac  Constipation Continue senna, Colace and MiraLAX.  GERD Continue PPI     T[facility] [place]traumatic brain injury) ([facility]      Code Status : Full code  Family Communication  : Mother at bedside  Disposition Plan  : Pending postoperative clinical improvement and PT evaluation.  Barriers For Discharge : Active symptoms  Consults  : Neurosurgery, neurology  Procedures  : EEG, MRI brain, cervical and thoracic spine CT  angiogram of the head and neck, Cervical dissection and intervertebral Medtronic device placement  DVT Prophylaxis  :  Lovenox   Lab Results  Component Value Date   PLT 135 (L) 03/04/2018    Antibiotics  :  Anti-infectives (From admission, onward)   Start     Dose/Rate Route Frequency Ordered Stop   03/03/18 1600  ceFAZolin (ANCEF) IVPB 2g/100 mL premix     2 g 200 mL/hr over 30 Minutes Intravenous Every 8 hours 03/03/18 1048 03/04/18 0830   03/03/18 1048  ceFAZolin (ANCEF) IVPB 2g/100 mL premix  Status:  Discontinued     2 g 200 mL/hr over 30 Minutes Intravenous 30 min pre-op 03/03/18 1048 03/03/18 1113   03/03/18 0736  bacitracin 50,000 Units in sodium chloride 0.9 % 500 mL irrigation  Status:  Discontinued       As needed 03/03/18 0736 03/03/18 0957        Objective:   Vitals:   03/03/18 1913 03/03/18 2335 03/04/18 0301 03/04/18 0736  BP: (!) 135/92 112/71 121/78 121/73  Pulse: (!) 104 90 83 86  Resp: 18 (!) '23 16 16  '$ Temp: 98.3 F (36.8 C) 98.3 F (36.8 C) 98.3 F (36.8 C) (!) 97.3 F (36.3 C)  TempSrc: Oral Axillary Axillary Axillary  SpO2: 99% 99% 100% 98%  Weight:      Height:        Wt Readings from Last 3 Encounters:  02/27/18 65.9 kg  02/16/18 67.6 kg  01/13/18 65.5 kg    No intake or output data in the 24 hours ending 03/04/18 1212  Physical exam Fatigued, not in distress HEENT: Clean dressing over anterior neck but appears swollen, nontender,  moist mucosa, no stridor, cervical collar in place Chest: Clear bilaterally, no wheezing or rhonchi CVs: Normal S1-S2, no murmurs GI: Soft, nondistended, nontender Musculoskeletal: Warm, no edema CNs: Alert and awake, moving all extremities     Data Review:    CBC Recent Labs  Lab 02/26/18 1216 03/01/18 1004 03/04/18 0433  WBC 5.7 5.5 8.8  HGB 14.3 15.3 13.4  HCT 45.0 45.5 42.0  PLT 193 195 135*  MCV 93.0 91.4 93.1  MCH 29.5 30.7 29.7  MCHC 31.8 33.6 31.9  RDW 12.7 12.8 12.9  LYMPHSABS 1.1  --   --   MONOABS 0.4  --   --   EOSABS 0.1  --   --   BASOSABS 0.0  --   --     Chemistries  Recent Labs  Lab 02/26/18 1216 03/01/18 1004 03/04/18 0433  NA 138 138 139  K 3.7 3.8 4.1  CL 102 102 102  CO2 '30 27 29  '$ GLUCOSE 76 54* 107*  BUN '7 9 10  '$ CREATININE 1.07 0.97 1.07  CALCIUM 8.6* 8.9 8.6*  AST 27  --   --   ALT 22  --   --   ALKPHOS 67  --   --   BILITOT 0.5  --   --    ------------------------------------------------------------------------------------------------------------------ No results for input(s): CHOL, HDL, LDLCALC, TRIG, CHOLHDL, LDLDIRECT in the last 72 hours.  Lab Results  Component Value Date   HGBA1C 4.4 (L) 02/26/2018   ------------------------------------------------------------------------------------------------------------------ No results for input(s): TSH, T4TOTAL, T3FREE, THYROIDAB in the last 72 hours.  Invalid input(s): FREET3 ------------------------------------------------------------------------------------------------------------------ No results for input(s): VITAMINB12, FOLATE, FERRITIN, TIBC, IRON, RETICCTPCT in the last 72 hours.  Coagulation profile Recent Labs  Lab 02/26/18 1216 03/04/18 0433  INR 1.06 1.13    No  results for input(s): DDIMER in the last 72 hours.  Cardiac Enzymes Recent Labs  Lab 02/26/18 1329  TROPONINI <0.03    ------------------------------------------------------------------------------------------------------------------ No results found for: BNP  Inpatient Medications  Scheduled Meds: . aspirin  300 mg Rectal Daily   Or  . aspirin  325 mg Oral Daily  . dexamethasone  4 mg Intravenous Q6H  . docusate sodium  100 mg Oral BID  . enoxaparin (LOVENOX) injection  40 mg Subcutaneous Q24H  . famotidine  20 mg Oral QHS  . FLUoxetine  20 mg Oral Daily  . gabapentin  300 mg Oral TID  . levothyroxine  75 mcg Oral Q0600  . pantoprazole  20 mg Oral BID AC  . phenytoin  100 mg Oral Q48H  . phenytoin  200 mg Oral Daily  . phenytoin  200 mg Oral Q48H  . polyethylene glycol  17 g Oral Daily  . senna  1 tablet Oral BID  . sodium chloride flush  3 mL Intravenous Q12H   Continuous Infusions: . sodium chloride 10 mL/hr at 03/03/18 1310  . sodium chloride Stopped (03/03/18 1106)  . methocarbamol (ROBAXIN) IV    . thiamine injection 500 mg (03/04/18 1114)   PRN Meds:.acetaminophen **OR** acetaminophen, albuterol, bisacodyl, dextromethorphan-guaiFENesin, HYDROcodone-acetaminophen, HYDROmorphone (DILAUDID) injection, LORazepam, menthol-cetylpyridinium **OR** phenol, methocarbamol **OR** methocarbamol (ROBAXIN) IV, ondansetron **OR** ondansetron (ZOFRAN) IV, oxyCODONE, senna-docusate, sodium chloride flush, sodium phosphate  Micro Results Recent Results (from the past 240 hour(s))  MRSA PCR Screening     Status: None   Collection Time: 03/02/18  8:41 PM  Result Value Ref Range Status   MRSA by PCR NEGATIVE NEGATIVE Final    Comment:        The GeneXpert MRSA Assay (FDA approved for NASAL specimens only), is one component of a comprehensive MRSA colonization surveillance program. It is not intended to diagnose MRSA infection nor to guide or monitor treatment for MRSA infections. Performed at [facility], [place] [address] [place], [place] [address]     Radiology Reports Ct Angio Head W  Or Wo Contrast  Result Date: 02/26/2018 CLINICAL DATA:  53 year old male with left leg shaking and weakness onset today. Prior left MCA infarct. EXAM: CT ANGIOGRAPHY HEAD AND NECK TECHNIQUE: Multidetector CT imaging of the head and neck was performed using the standard protocol during bolus administration of intravenous contrast. Multiplanar CT image reconstructions and MIPs were obtained to evaluate the vascular anatomy. Carotid stenosis measurements (when applicable) are obtained utilizing NASCET criteria, using the distal internal carotid diameter as the denominator. CONTRAST:  124m ISOVUE-370 IOPAMIDOL (ISOVUE-370) INJECTION 76% COMPARISON:  Head CT without contrast 1317 hours today. FINDINGS: CTA NECK Skeleton: Postoperative changes to the anterior mandible. Cervical spine dextroconvex scoliosis and cervicothoracic junction ankylosis. No acute osseous abnormality identified. Upper chest: Negative. Other neck: Thyromegaly. The largest thyroid nodule by CT is 11 millimeters which does not meet size criteria for follow-up. Otherwise negative neck soft tissues. Aortic arch: 3 vessel arch configuration with minimal arch atherosclerosis. Right carotid system: Negative. Left carotid system: Negative. Mildly tortuous left ICA just below the skull base. Vertebral arteries: Normal proximal right subclavian artery and right vertebral artery origin. The right vertebral is non dominant and diminutive but patent to the skull base without stenosis. No proximal left subclavian artery or left vertebral artery origin plaque or stenosis. The left vertebral origin is somewhat shared with a left thyrocervical trunk (series 7, image 282, normal variant). The left vertebral is dominant and patent to  the skull base without stenosis. CTA HEAD Posterior circulation: There is mild to moderate irregularity and stenosis of the dominant left vertebral artery as it crosses the dura on series 11, image 29. No other distal vertebral  stenosis. The left PICA origin is patent. There is similar mild irregularity and stenosis of the distal right vertebral artery as it crosses the dura. The right V4 segment appears occluded distal to the patent right PICA origin. The left vertebral supplies the basilar. Patent basilar artery without stenosis. Normal SCA and PCA origins. Fetal type left PCA origin. Bilateral PCA branches are within normal limits. Anterior circulation: Both ICA siphons are patent. The left siphon appears somewhat dominant with mild calcified plaque at the anterior genu but no stenosis. The right siphon is non dominant and the right supraclinoid segment appears somewhat tapered and diminutive without focal stenosis (series 9 image 88). Patent carotid termini. The left ACA A1 segment is dominant and the right A1 is diminutive. ACA origins, anterior communicating artery and bilateral ACA branches are within normal limits. The left MCA origin is mildly irregular without stenosis. The left M1 and bifurcation are patent without stenosis. The left MCA branches appear mildly irregular. The right MCA origin and M1 segment are patent without stenosis. The right MCA bifurcation and M2 segments are patent without stenosis. No right MCA branch occlusion is identified. There is asymmetric elevation of the left MCA branches owing to the left hemisphere encephalomalacia. Venous sinuses: Patent on the delayed images. Anatomic variants: Dominant left vertebral artery. Fetal type left PCA origin. Delayed phase: No abnormal enhancement identified. Stable supratentorial gray-white matter differentiation throughout the brain. There is suggestion of linear hypodensity in the right pons now on series 13, image 10. No associated hemorrhage or mass effect. Review of the MIP images confirms the above findings IMPRESSION: 1. Negative for large vessel occlusion in the anterior circulation but positive for occlusion of the non-dominant distal Right Vertebral Artery  just beyond the right PICA origin which is age indeterminate. 2. Delayed images suggest a linear lacunar infarct in the right pons. No associated hemorrhage or mass effect. 3. No atherosclerosis in the neck, but up to moderate stenosis of the dominant Left Vertebral Artery in the posterior fossa as it crosses the dura. 4. Dominant left ICA siphon with fetal type left PCA origin. The right supraclinoid ICA is diminutive but without focal stenosis. 5. Chronic Left MCA infarct with mild irregularity of the left MCA origin and left MCA branches without stenosis. Electronically Signed   By: Genevie Ann M.D.   On: 02/26/2018 16:50   Dg Chest 2 View  Result Date: 02/26/2018 CLINICAL DATA:  Mitral valve prolapse.  Tremors EXAM: CHEST - 2 VIEW COMPARISON:  May 13, 2017. FINDINGS: There is no evident edema or consolidation. The heart is upper normal in size with pulmonary vascularity normal. No adenopathy. No bone lesions. No radiopaque foreign bodies beyond overlying monitor leads. IMPRESSION: No edema or consolidation.  Stable cardiac silhouette. Electronically Signed   By: Lowella Grip III M.D.   On: 02/26/2018 16:20   Dg Cervical Spine 1 View  Result Date: 03/03/2018 CLINICAL DATA:  ACDF. EXAM: DG C-ARM 61-120 MIN; DG CERVICAL SPINE - 1 VIEW COMPARISON:  MRI 02/27/2018 FINDINGS: Changes of anterior fusion at C5-6. Normal alignment. No hardware complicating feature. IMPRESSION: ACDF C5-6.  No visible complicating feature. Electronically Signed   By: Rolm Baptise M.D.   On: 03/03/2018 11:41   Ct Head Wo Contrast  Result  Date: 02/26/2018 CLINICAL DATA:  LEFT leg weakness and dizziness for 1 day. EXAM: CT HEAD WITHOUT CONTRAST TECHNIQUE: Contiguous axial images were obtained from the base of the skull through the vertex without intravenous contrast. COMPARISON:  None. FINDINGS: Brain: No definite acute stroke, acute hemorrhage, mass lesion, hydrocephalus, or extra-axial fluid. Advanced cerebellar atrophy.  Extensive encephalomalacia LEFT hemisphere, frontotemporal, related to old stroke. Compensatory enlargement LEFT lateral ventricle. Mild to moderate premature cerebral atrophy. Hypoattenuation of white matter, likely small vessel disease. Vascular: Calcification of the cavernous internal carotid arteries consistent with cerebrovascular atherosclerotic disease. No emergent large vessel occlusion is evident. Skull: Normal. Negative for fracture or focal lesion. Sinuses/Orbits: No acute finding. Other: None. IMPRESSION: Chronic changes as described. Old LEFT MCA territory infarct. Cerebellar greater than cerebral atrophy. Small vessel disease. No acute intracranial findings. Electronically Signed   By: Staci Righter M.D.   On: 02/26/2018 13:33   Ct Angio Neck W And/or Wo Contrast  Result Date: 02/26/2018 CLINICAL DATA:  53 year old male with left leg shaking and weakness onset today. Prior left MCA infarct. EXAM: CT ANGIOGRAPHY HEAD AND NECK TECHNIQUE: Multidetector CT imaging of the head and neck was performed using the standard protocol during bolus administration of intravenous contrast. Multiplanar CT image reconstructions and MIPs were obtained to evaluate the vascular anatomy. Carotid stenosis measurements (when applicable) are obtained utilizing NASCET criteria, using the distal internal carotid diameter as the denominator. CONTRAST:  155m ISOVUE-370 IOPAMIDOL (ISOVUE-370) INJECTION 76% COMPARISON:  Head CT without contrast 1317 hours today. FINDINGS: CTA NECK Skeleton: Postoperative changes to the anterior mandible. Cervical spine dextroconvex scoliosis and cervicothoracic junction ankylosis. No acute osseous abnormality identified. Upper chest: Negative. Other neck: Thyromegaly. The largest thyroid nodule by CT is 11 millimeters which does not meet size criteria for follow-up. Otherwise negative neck soft tissues. Aortic arch: 3 vessel arch configuration with minimal arch atherosclerosis. Right carotid  system: Negative. Left carotid system: Negative. Mildly tortuous left ICA just below the skull base. Vertebral arteries: Normal proximal right subclavian artery and right vertebral artery origin. The right vertebral is non dominant and diminutive but patent to the skull base without stenosis. No proximal left subclavian artery or left vertebral artery origin plaque or stenosis. The left vertebral origin is somewhat shared with a left thyrocervical trunk (series 7, image 282, normal variant). The left vertebral is dominant and patent to the skull base without stenosis. CTA HEAD Posterior circulation: There is mild to moderate irregularity and stenosis of the dominant left vertebral artery as it crosses the dura on series 11, image 29. No other distal vertebral stenosis. The left PICA origin is patent. There is similar mild irregularity and stenosis of the distal right vertebral artery as it crosses the dura. The right V4 segment appears occluded distal to the patent right PICA origin. The left vertebral supplies the basilar. Patent basilar artery without stenosis. Normal SCA and PCA origins. Fetal type left PCA origin. Bilateral PCA branches are within normal limits. Anterior circulation: Both ICA siphons are patent. The left siphon appears somewhat dominant with mild calcified plaque at the anterior genu but no stenosis. The right siphon is non dominant and the right supraclinoid segment appears somewhat tapered and diminutive without focal stenosis (series 9 image 88). Patent carotid termini. The left ACA A1 segment is dominant and the right A1 is diminutive. ACA origins, anterior communicating artery and bilateral ACA branches are within normal limits. The left MCA origin is mildly irregular without stenosis. The left M1 and bifurcation  are patent without stenosis. The left MCA branches appear mildly irregular. The right MCA origin and M1 segment are patent without stenosis. The right MCA bifurcation and M2  segments are patent without stenosis. No right MCA branch occlusion is identified. There is asymmetric elevation of the left MCA branches owing to the left hemisphere encephalomalacia. Venous sinuses: Patent on the delayed images. Anatomic variants: Dominant left vertebral artery. Fetal type left PCA origin. Delayed phase: No abnormal enhancement identified. Stable supratentorial gray-white matter differentiation throughout the brain. There is suggestion of linear hypodensity in the right pons now on series 13, image 10. No associated hemorrhage or mass effect. Review of the MIP images confirms the above findings IMPRESSION: 1. Negative for large vessel occlusion in the anterior circulation but positive for occlusion of the non-dominant distal Right Vertebral Artery just beyond the right PICA origin which is age indeterminate. 2. Delayed images suggest a linear lacunar infarct in the right pons. No associated hemorrhage or mass effect. 3. No atherosclerosis in the neck, but up to moderate stenosis of the dominant Left Vertebral Artery in the posterior fossa as it crosses the dura. 4. Dominant left ICA siphon with fetal type left PCA origin. The right supraclinoid ICA is diminutive but without focal stenosis. 5. Chronic Left MCA infarct with mild irregularity of the left MCA origin and left MCA branches without stenosis. Electronically Signed   By: Genevie Ann M.D.   On: 02/26/2018 16:50   Mr Brain Wo Contrast  Result Date: 02/26/2018 CLINICAL DATA:  Initial evaluation for acute left lower extremity shaking, gait instability. EXAM: MRI HEAD WITHOUT CONTRAST TECHNIQUE: Multiplanar, multiecho pulse sequences of the brain and surrounding structures were obtained without intravenous contrast. COMPARISON:  Prior CT and CTA from earlier the same day. FINDINGS: Brain: Moderately advanced cerebral and cerebellar atrophy with prominent vermian atrophy noted. Large area of cystic encephalomalacia involving the peripheral left  frontotemporal region, likely related to history of remote traumatic brain injury. Scattered chronic hemosiderin staining present within this region. Chronic hemosiderin staining also noted at the cerebellar vermis. No abnormal foci of restricted diffusion to suggest acute or subacute ischemia. Gray-white matter differentiation maintained. No other areas of remote cortical infarction. No evidence for acute intracranial hemorrhage. No mass lesion, midline shift or mass effect. No hydrocephalus. No extra-axial fluid collection. Pituitary gland normal. Vascular: Probable occluded right V4 segment, also seen on prior CTA. Major intracranial vascular flow voids otherwise maintained. Skull and upper cervical spine: Craniocervical junction normal. Bone marrow signal intensity within normal limits. No scalp soft tissue abnormality. Sinuses/Orbits: Globes and orbital soft tissues within normal limits. Paranasal sinuses are largely clear. Trace right mastoid effusion noted, of doubtful significance. Inner ear structures grossly normal. Other: None. IMPRESSION: 1. No acute intracranial abnormality. 2. Large area of cystic encephalomalacia involving the peripheral left frontotemporal region, most likely related to history of traumatic brain injury. 3. Moderately advanced cerebral and cerebellar atrophy, with prominent vermian atrophy. Electronically Signed   By: Jeannine Boga M.D.   On: 02/26/2018 19:34   Mr Cervical Spine Wo Contrast  Result Date: 02/27/2018 CLINICAL DATA:  Initial evaluation for acute myelopathy. EXAM: MRI CERVICAL SPINE WITHOUT CONTRAST TECHNIQUE: Multiplanar, multisequence MR imaging of the cervical spine was performed. No intravenous contrast was administered. COMPARISON:  None available. FINDINGS: Alignment: Examination technically limited as the patient was unable to tolerate the full length of the exam. Sagittal T1, T2, and STIR sequences only were performed. No axial images are provided.  Straightening of  the normal cervical lordosis. Trace 2 mm retrolisthesis of C5 on C6, likely chronic and facet mediated. Vertebrae: Vertebral body heights maintained without evidence for acute or chronic fracture. Bone marrow signal intensity within normal limits. No discrete or worrisome osseous lesions. Reactive marrow edema about the left C2-3 facet due to facet arthritis (series 5, image 18). No other abnormal marrow edema. Cord: Patchy T2 signal abnormality within the cervical spinal cord at C5-6 due to stenosis, compatible with edema and/or myelomalacia (series 3, image 9). Signal intensity within the cervical spinal cord otherwise grossly normal on this limited exam. Posterior Fossa, vertebral arteries, paraspinal tissues: Partially visualized brain and posterior fossa demonstrate no acute finding. Craniocervical junction normal. Paraspinous and prevertebral soft tissues grossly within normal limits. Probable enlarged multinodular goiter noted. Disc levels: C2-C3: No significant disc pathology. Left-sided facet arthrosis. No significant canal stenosis. C3-C4: No significant disc pathology. No significant spinal stenosis. C4-C5: No significant disc pathology. No significant spinal stenosis. C5-C6: Circumferential disc osteophyte with intervertebral disc space narrowing. Resultant severe spinal stenosis with impingement of the cervical spinal cord and cord signal changes. Thecal sac measures approximately 4 mm in AP diameter at its most narrow point. C6-C7: Mild annular disc bulge with disc desiccation. No significant spinal stenosis. C7-T1: C7 and T1 vertebral bodies are partially ankylosed. No significant stenosis. Visualized upper thoracic spine within normal limits. IMPRESSION: 1. Technically limited examination due to patient's inability to tolerate the full length of the study. Sagittal sequences only were performed. 2. Degenerative disc osteophyte at C5-6 with resultant severe spinal stenosis with  impingement of the cervical spinal cord. Associated cord signal abnormality at this level compatible with edema and/or myelomalacia. 3. Advanced left-sided facet hypertrophy at C2-3 with associated reactive marrow edema. Finding could contribute to neck pain. Electronically Signed   By: Jeannine Boga M.D.   On: 02/27/2018 17:43   Mr Thoracic Spine Wo Contrast  Result Date: 02/28/2018 CLINICAL DATA:  Balance disturbance and left lower extremity tremor. EXAM: MRI THORACIC SPINE WITHOUT CONTRAST TECHNIQUE: Multiplanar, multisequence MR imaging of the thoracic spine was performed. No intravenous contrast was administered. COMPARISON:  None. FINDINGS: Alignment:  Physiologic. Vertebrae: No fracture, evidence of discitis, or bone lesion. Cord:  Normal signal and morphology. Paraspinal and other soft tissues: Negative. Disc levels: The axial images are motion degraded. T5-6: Small central disc protrusion with mild spinal canal stenosis. Right-greater-than-left facet hypertrophy. T12-L1: Left-greater-than-right facet hypertrophy. No spinal canal stenosis. There is no other significant disc herniation or spinal stenosis. IMPRESSION: 1. No acute abnormality of the thoracic spine. 2. Small central disc protrusion at T5-6 with mild spinal canal stenosis. Electronically Signed   By: Ulyses Jarred M.D.   On: 02/28/2018 21:56   Dg C-arm 1-60 Min  Result Date: 03/03/2018 CLINICAL DATA:  ACDF. EXAM: DG C-ARM 61-120 MIN; DG CERVICAL SPINE - 1 VIEW COMPARISON:  MRI 02/27/2018 FINDINGS: Changes of anterior fusion at C5-6. Normal alignment. No hardware complicating feature. IMPRESSION: ACDF C5-6.  No visible complicating feature. Electronically Signed   By: Rolm Baptise M.D.   On: 03/03/2018 11:41    Time Spent in minutes  25   Jalan Bodi M.D on 03/04/2018 at 12:12 PM  Between 7am to 7pm - Pager - [phone]  After 7pm go to [email or web address] - password [facility]  [facility] -  Office  [phone]

## 2018-03-04 NOTE — Progress Notes (Addendum)
   Providing Compassionate, Quality Care - Together   Subjective: Patient reports difficulty swallowing overnight and perceived sense of difficulty breathing. Oxygen saturation remains at 100%. Patient insists on wearing nasal cannula, but do not believe this is necessary. Denies pain.  Objective: Vital signs in last 24 hours: Temp:  [97.3 F (36.3 C)-98.5 F (36.9 C)] 97.3 F (36.3 C) (01/01 0736) Pulse Rate:  [83-104] 86 (01/01 0736) Resp:  [16-23] 16 (01/01 0736) BP: (112-147)/(71-92) 121/73 (01/01 0736) SpO2:  [92 %-100 %] 98 % (01/01 0736)  Intake/Output from previous day: 12/31 0701 - 01/01 0700 In: 1400 [I.V.:1400] Out: 500 [Urine:450; Blood:50] Intake/Output this shift: No intake/output data recorded.  Alert and oriented x 4 MAE, spasticity improving CN grossly intact, except CN VIII (Pt is deaf at baseline) PERRLA Dressing on neck is clean, dry, and intact Aspen collar ordered  Lab Results: Recent Labs    03/04/18 0433  WBC 8.8  HGB 13.4  HCT 42.0  PLT 135*   BMET Recent Labs    03/04/18 0433  NA 139  K 4.1  CL 102  CO2 29  GLUCOSE 107*  BUN 10  CREATININE 1.07  CALCIUM 8.6*    Studies/Results: Dg Cervical Spine 1 View  Result Date: 03/03/2018 CLINICAL DATA:  ACDF. EXAM: DG C-ARM 61-120 MIN; DG CERVICAL SPINE - 1 VIEW COMPARISON:  MRI 02/27/2018 FINDINGS: Changes of anterior fusion at C5-6. Normal alignment. No hardware complicating feature. IMPRESSION: ACDF C5-6.  No visible complicating feature. Electronically Signed   By: Rolm Baptise M.D.   On: 03/03/2018 11:41   Dg C-arm 1-60 Min  Result Date: 03/03/2018 CLINICAL DATA:  ACDF. EXAM: DG C-ARM 61-120 MIN; DG CERVICAL SPINE - 1 VIEW COMPARISON:  MRI 02/27/2018 FINDINGS: Changes of anterior fusion at C5-6. Normal alignment. No hardware complicating feature. IMPRESSION: ACDF C5-6.  No visible complicating feature. Electronically Signed   By: Rolm Baptise M.D.   On: 03/03/2018 11:41     Assessment/Plan Patient is one day status post C5-6 ACDF for spinal cord decompression. Patient is having difficulty swallowing.   LOS: 5 days    -Continue working with therapies -IV decadron for difficulty swallowing  Viona Gilmore, DNP, AGNP-C Nurse Practitioner  [facility] [address], [address], [place], [place] [address] P: [phone]    F: [phone]  03/04/2018, 11:11 AM

## 2018-03-04 NOTE — Evaluation (Signed)
Occupational Therapy Re Evaluation Patient Details Name: Damon [name]. MRN: [id number] DOB: 19-May-1965 Today's Date: 03/04/2018    History of Present Illness Pt is a 53 y/o male with medical history significant of TBI, L temporal lobe encephalomalacia, depression, deafness, seizure, MVP, GERD, who presents with left leg weakness and poor balance. MRI, CTA, EEG negative. Pt underwent ACDF 03/03/18.   Clinical Impression   Pt with unsteady gait, safer with RW and post operative neck pain. Pt and family educated in cervical precautions, donning and doffing c-collar and wearing schedule. Pt requires min guard assist for bed mobility, min assist for ambulation and min guard assist for ADL. Will follow acutely. Pt reports feeling as if he cannot get enough air, but Sp02 96-99% on RA.    Follow Up Recommendations  No OT follow up    Equipment Recommendations  None recommended by OT    Recommendations for Other Services       Precautions / Restrictions Precautions Precautions: Fall;Cervical Precaution Booklet Issued: Yes (comment) Precaution Comments: educated pt and his parents in cervical precautions Required Braces or Orthoses: Cervical Brace Cervical Brace: Hard collar;Other (comment)(may remove in bed, to walk to bathroom and showering) Restrictions Weight Bearing Restrictions: No      Mobility Bed Mobility Overal bed mobility: Needs Assistance Bed Mobility: Rolling;Sidelying to Sit;Sit to Sidelying Rolling: Min guard Sidelying to sit: Min guard     Sit to sidelying: Min guard General bed mobility comments: cues for log roll technique  Transfers Overall transfer level: Needs assistance Equipment used: Rolling walker (2 wheeled) Transfers: Sit to/from Stand Sit to Stand: Min guard         General transfer comment: mild unsteadiness with RW    Balance Overall balance assessment: Needs assistance   Sitting balance-Leahy Scale: Good       Standing  balance-Leahy Scale: Fair Standing balance comment: can release walker in static standing                           ADL either performed or assessed with clinical judgement   ADL Overall ADL's : Needs assistance/impaired Eating/Feeding: Set up;Sitting   Grooming: Oral care;Standing;Min guard Grooming Details (indicate cue type and reason): educated in 2 cup method Upper Body Bathing: Set up;Sitting   Lower Body Bathing: Min guard;Sit to/from stand Lower Body Bathing Details (indicate cue type and reason): recommended seated showering  Upper Body Dressing : Set up;Sitting   Lower Body Dressing: Set up;Sitting/lateral leans Lower Body Dressing Details (indicate cue type and reason): socks, crossing foot over opposite knee Toilet Transfer: Minimal assistance;Ambulation;RW;BSC   Toileting- Water quality scientist and Hygiene: Min guard;Sit to/from stand       Functional mobility during ADLs: Minimal assistance;Rolling walker General ADL Comments: Educated pt and parents in donning and doffing of cervical brace and wearing schedule. Pt with c/o not getting enough air, but 02 sats at 99% on RA.     Vision Baseline Vision/History: Wears glasses Wears Glasses: At all times Patient Visual Report: No change from baseline Vision Assessment?: No apparent visual deficits     Perception     Praxis      Pertinent Vitals/Pain Pain Assessment: Faces Faces Pain Scale: Hurts whole lot Pain Location: neck Pain Descriptors / Indicators: Grimacing;Moaning Pain Intervention(s): Patient requesting pain meds-RN notified;Monitored during session;Repositioned     Hand Dominance Right   Extremity/Trunk Assessment Upper Extremity Assessment Upper Extremity Assessment: Overall WFL for tasks assessed  Lower Extremity Assessment Lower Extremity Assessment: Defer to PT evaluation   Cervical / Trunk Assessment Cervical / Trunk Assessment: Other exceptions;Kyphotic(S/p ACDF)    Communication Communication Communication: Deaf;Other (comment)(parent interpreted)   Cognition Arousal/Alertness: Awake/alert Behavior During Therapy: WFL for tasks assessed/performed Overall Cognitive Status: History of cognitive impairments - at baseline                                 General Comments: parents present and supportive   General Comments       Exercises     Shoulder Instructions      Home Living Family/patient expects to be discharged to:: Private residence Living Arrangements: Parent Available Help at Discharge: Family Type of Home: Mobile home Home Access: Ramped entrance     Home Layout: One level     Bathroom Shower/Tub: Teacher, early years/pre: Standard     Home Equipment: Grab bars - tub/shower;Wheelchair - [employer] - 4 wheels;Cane - single point;Bedside commode          Prior Functioning/Environment Level of Independence: Independent with assistive device(s)        Comments: used cane for mobility         OT Problem List: Impaired balance (sitting and/or standing);Decreased safety awareness;Decreased knowledge of use of DME or AE      OT Treatment/Interventions: Self-care/ADL training;Therapeutic activities;Balance training;Patient/family education    OT Goals(Current goals can be found in the care plan section) Acute Rehab OT Goals Patient Stated Goal: to go home OT Goal Formulation: With patient Time For Goal Achievement: 03/13/18 Potential to Achieve Goals: Good  OT Frequency: Min 2X/week   Barriers to D/C:            Co-evaluation       OT goals addressed during session: ADL's and self-care      AM-PAC OT "6 Clicks" Daily Activity     Outcome Measure Help from another person eating meals?: None Help from another person taking care of personal grooming?: A Little Help from another person toileting, which includes using toliet, bedpan, or urinal?: A Little Help from another person  bathing (including washing, rinsing, drying)?: A Little Help from another person to put on and taking off regular upper body clothing?: None Help from another person to put on and taking off regular lower body clothing?: A Little 6 Click Score: 20   End of Session Equipment Utilized During Treatment: Gait belt;Rolling walker Nurse Communication: Mobility status;Patient requests pain meds  Activity Tolerance: Patient tolerated treatment well Patient left: in bed;with call bell/phone within reach;with family/visitor present;with nursing/sitter in room  OT Visit Diagnosis: Other abnormalities of gait and mobility (R26.89);Muscle weakness (generalized) (M62.81)                Time: 6553-7482 OT Time Calculation (min): 48 min Charges:  OT General Charges $OT Visit: 1 Visit OT Evaluation $OT Re-eval: 1 Re-eval OT Treatments $Self Care/Home Management : 8-22 mins  Nestor Lewandowsky, OTR/L Acute Rehabilitation Services Pager: [phone] Office: [phone]  Malka So 03/04/2018, 11:04 AM

## 2018-03-04 NOTE — Progress Notes (Addendum)
Physical Therapy Re-Evaluation Patient Details Name: Damon [name]. MRN: [id number] DOB: 09/17/1965 Today's Date: 03/04/2018    History of Present Illness Pt is a 53 y/o male with medical history significant of TBI, L temporal lobe encephalomalacia, depression, deafness, seizure, MVP, GERD, who presents with left leg weakness and poor balance. MRI, CTA, EEG negative. Pt underwent ACDF 03/03/18.    PT Comments    Pt admitted with above diagnosis. Pt currently with functional limitations due to balance and endurance deficits. Pt was able to ambulate with RW with overall fair stability.  Per mother, pt is walking with better stability than he has had in years.  Previous Goals remain appropriate.  Spent incr time going over precautions with mom and pt and gave handouts regarding precautions. Will continue to work with pt with parents very involved in pts care.    Pt will benefit from skilled PT to increase their independence and safety with mobility to allow discharge to the venue listed below.    Follow Up Recommendations  Outpatient PT     Equipment Recommendations  None recommended by PT , issued gait belt to family   Recommendations for Other Services       Precautions / Restrictions Precautions Precautions: Fall;Cervical Precaution Booklet Issued: Yes (comment) Precaution Comments: educated pt and his parents in cervical precautions Required Braces or Orthoses: Cervical Brace Cervical Brace: Hard collar;Other (comment)(may remove in bed, to walk to bathroom and showering) Restrictions Weight Bearing Restrictions: No    Mobility  Bed Mobility Overal bed mobility: Needs Assistance Bed Mobility: Rolling;Sidelying to Sit;Sit to Sidelying Rolling: Min guard Sidelying to sit: Min guard     Sit to sidelying: Min guard General bed mobility comments: cues for log roll technique  Transfers Overall transfer level: Needs assistance Equipment used: Rolling walker (2  wheeled) Transfers: Sit to/from Stand Sit to Stand: Min guard         General transfer comment: mild unsteadiness with RW  Ambulation/Gait Ambulation/Gait assistance: Min guard;Min assist Gait Distance (Feet): 150 Feet Assistive device: Rolling walker (2 wheeled) Gait Pattern/deviations: Step-through pattern;Decreased stride length;Ataxic;Decreased dorsiflexion - left;Drifts right/left Gait velocity: decreased Gait velocity interpretation: <1.8 ft/sec, indicate of risk for recurrent falls General Gait Details: pateint with instability at times but not needeing as much assist as last treatment with better safety and stability with RW.   level of assist ranging from Min guard to min A with LOB; reduced coordination and overall efficiency.  Mom states that pt actually is steadier now than prior to admit, that he is unsteady at baseline.  E#ncouraged mom to use gait belt at all times and for pt to use RW for safety at least intiialy.    Stairs             Wheelchair Mobility    Modified Rankin (Stroke Patients Only)       Balance Overall balance assessment: Needs assistance Sitting-balance support: No upper extremity supported;Feet supported Sitting balance-Leahy Scale: Good     Standing balance support: During functional activity;No upper extremity supported Standing balance-Leahy Scale: Fair Standing balance comment: can release walker in static standing at sink to brush teeth but not for more than 20 seconds.  Has posterior lean at times.                             Cognition Arousal/Alertness: Awake/alert Behavior During Therapy: WFL for tasks assessed/performed Overall Cognitive Status: History of cognitive  impairments - at baseline                                 General Comments: parents present and supportive      Exercises      General Comments General comments (skin integrity, edema, etc.): mother present to interpret and very  supportive.      Pertinent Vitals/Pain Pain Assessment: Faces Faces Pain Scale: Hurts whole lot Pain Location: neck Pain Descriptors / Indicators: Grimacing;Moaning Pain Intervention(s): Limited activity within patient's tolerance;Monitored during session;Repositioned;Patient requesting pain meds-RN notified    Home Living Family/patient expects to be discharged to:: Private residence Living Arrangements: Parent Available Help at Discharge: Family Type of Home: Mobile home Home Access: Ramped entrance   Home Layout: One level Home Equipment: Grab bars - tub/shower;Wheelchair - [employer] - 4 wheels;Cane - single point;Bedside commode Additional Comments: home set up provided by family, pt with prior TBI per mom, she has been his caregiver over 20 years    Prior Function Level of Independence: Independent with assistive device(s)      Comments: used cane for mobility    PT Goals (current goals can now be found in the care plan section) Acute Rehab PT Goals Patient Stated Goal: to go home PT Goal Formulation: With patient/family Time For Goal Achievement: 03/13/18 Potential to Achieve Goals: Good Progress towards PT goals: Progressing toward goals    Frequency    Min 5X/week      PT Plan Frequency needs to be updated    Co-evaluation PT/OT/SLP Co-Evaluation/Treatment: Yes Reason for Co-Treatment: For patient/therapist safety PT goals addressed during session: Mobility/safety with mobility OT goals addressed during session: ADL's and self-care      AM-PAC PT "6 Clicks" Mobility   Outcome Measure  Help needed turning from your back to your side while in a flat bed without using bedrails?: None Help needed moving from lying on your back to sitting on the side of a flat bed without using bedrails?: None Help needed moving to and from a bed to a chair (including a wheelchair)?: A Little Help needed standing up from a chair using your arms (e.g., wheelchair or  bedside chair)?: A Little Help needed to walk in hospital room?: A Little Help needed climbing 3-5 steps with a railing? : A Lot 6 Click Score: 19    End of Session Equipment Utilized During Treatment: Gait belt Activity Tolerance: Patient tolerated treatment well Patient left: in bed;with call bell/phone within reach;with bed alarm set;with family/visitor present Nurse Communication: Mobility status PT Visit Diagnosis: Unsteadiness on feet (R26.81);Difficulty in walking, not elsewhere classified (R26.2)     Time: 9150-5697 PT Time Calculation (min) (ACUTE ONLY): 33 min  Charges:    Re-evaluation                    Murial Beam,PT Acute Rehabilitation Services Pager:  [phone]  Office:  [place] 03/04/2018, 11:48 AM

## 2018-03-05 ENCOUNTER — Encounter (HOSPITAL_COMMUNITY): Payer: Self-pay | Admitting: Neurosurgery

## 2018-03-05 ENCOUNTER — Inpatient Hospital Stay (HOSPITAL_COMMUNITY): Payer: Medicare HMO

## 2018-03-05 MED ORDER — PHENYTOIN SODIUM 50 MG/ML IJ SOLN
100.0000 mg | INTRAMUSCULAR | Status: DC
  Administered 2018-03-05 – 2018-03-07 (×2): 100 mg via INTRAVENOUS
  Filled 2018-03-05 (×3): qty 2

## 2018-03-05 MED ORDER — SODIUM CHLORIDE 0.9 % IV SOLN
INTRAVENOUS | Status: AC
  Administered 2018-03-05 – 2018-03-06 (×2): via INTRAVENOUS

## 2018-03-05 MED ORDER — MORPHINE SULFATE (PF) 2 MG/ML IV SOLN: 1.0000 mg | INTRAVENOUS | Status: DC | PRN

## 2018-03-05 MED ORDER — SODIUM CHLORIDE 0.9 % IV SOLN
200.0000 mg | INTRAVENOUS | Status: DC
  Administered 2018-03-06: 200 mg via INTRAVENOUS
  Filled 2018-03-05: qty 4

## 2018-03-05 MED ORDER — SODIUM CHLORIDE 0.9 % IV SOLN
200.0000 mg | Freq: Every day | INTRAVENOUS | Status: DC
  Administered 2018-03-05 – 2018-03-07 (×3): 200 mg via INTRAVENOUS
  Filled 2018-03-05 (×8): qty 4

## 2018-03-05 MED ORDER — LEVOTHYROXINE SODIUM 100 MCG/5ML IV SOLN
50.0000 ug | Freq: Every day | INTRAVENOUS | Status: DC
  Administered 2018-03-05 – 2018-03-10 (×6): 50 ug via INTRAVENOUS
  Filled 2018-03-05 (×6): qty 5

## 2018-03-05 NOTE — Progress Notes (Signed)
PROGRESS NOTE                                                                                                                                                                                                             Patient Demographics:    Damon [name], is a 53 y.o. male, DOB - [birth year], HWE:[id number]  Admit date - 02/26/2018   Admitting Physician Ivor Costa, MD  Outpatient Primary MD for the patient is Damon Dance, DO  LOS - 6  Outpatient Specialists: None  Chief Complaint  Patient presents with  . Near Syncope  . Weakness       Brief Narrative 53 year old male with history of traumatic brain injury, seizures, deafness, depression, hypothyroidism, GERD and MDP presenting with left leg shaking and poor balance.  MRI of the cervical spine showed significant cord compression at the level of C5-C6.  Seen by neurosurgery with plan on surgical intervention on 12/31.   Subjective:   Still having difficulty swallowing.  Coughing is better from yesterday.  Gait has improved and is no longer spastic.   Assessment  & Plan :    Principal Problem: Acute gait instability with C5-C6 cord compression Underwent C5-6 discectomy for decompression of spinal cord. placement of intervertebral biomechanical device Medtronic PTC PEEK 49m lordotic cage. Tolerated surgery well. PT eval. Further recs per neurosurgery. Noted for low B12 (115).  Given subcu dose on 12/30. On postop day 2 patient having difficulty swallowing, neck appears swollen.  No stridor or drooling from the mouth. Neurosurgery placed him on IV Decadron.  Switch pain medications to IV. Seen by SLP and recommends high aspiration risk, on ice chips only.  Ordered IV fluids.  MBS today. We will discuss with neurosurgery if he needs imaging of the neck.  Switch meds to IV.  (Dilantin, Synthroid)  Active Problems: [place]izure disorder [place] without seizure  activity.  Continue Dilantin.     Hypothyroidism Continue Synthroid.  TSH normal.  History of depression Continue Prozac  Constipation Continue senna, Colace and MiraLAX.  GERD Continue PPI     T[place] brain injury) ([facility]      Code Status : Full code  Family Communication  : Mother at bedside  Disposition Plan  : Pending postoperative clinical improvement and PT evaluation.  Barriers For Discharge : Active symptoms  Consults  :  Neurosurgery, neurology  Procedures  : EEG, MRI brain, cervical and thoracic spine CT angiogram of the head and neck, Cervical dissection and intervertebral Medtronic device placement  DVT Prophylaxis  :  Lovenox   Lab Results  Component Value Date   PLT 135 (L) 03/04/2018    Antibiotics  :  Anti-infectives (From admission, onward)   Start     Dose/Rate Route Frequency Ordered Stop   03/03/18 1600  ceFAZolin (ANCEF) IVPB 2g/100 mL premix     2 g 200 mL/hr over 30 Minutes Intravenous Every 8 hours 03/03/18 1048 03/04/18 0830   03/03/18 1048  ceFAZolin (ANCEF) IVPB 2g/100 mL premix  Status:  Discontinued     2 g 200 mL/hr over 30 Minutes Intravenous 30 min pre-op 03/03/18 1048 03/03/18 1113   03/03/18 0736  bacitracin 50,000 Units in sodium chloride 0.9 % 500 mL irrigation  Status:  Discontinued       As needed 03/03/18 0736 03/03/18 0957        Objective:   Vitals:   03/04/18 2002 03/04/18 2257 03/05/18 0505 03/05/18 0750  BP: 112/62 (!) 101/57 100/67 105/67  Pulse: 96 87 83 80  Resp:  _0 Temp: 98.8 F (37.1 C) 98.9 F (37.2 C) 97.8 F (36.6 C) 98.2 F (36.8 C)  TempSrc: Oral Oral Oral Axillary  SpO2: 93% 95% 97% 95%  Weight:      Height:        Wt Readings from Last 3 Encounters:  02/27/18 65.9 kg  02/16/18 67.6 kg  01/13/18 65.5 kg     Intake/Output Summary (Last 24 hours) at 03/05/2018 0944 Last data filed at 03/05/2018 0500 Gross per 24 hour  Intake 475.83 ml  Output -  Net 475.83 ml    Physical exam Fatigued HEENT: Dressing over anterior neck with some swelling, no bleeding nontender, no stridor, cervical collar + Chest: Clear bilaterally, no wheezing or rhonchi CVs: Normal S1-S2 GI: Soft, nondistended, nontender Musculoskeletal: Warm, no edema CNS: Alert and oriented, nonverbal, spasticity appears to have resolved.     Data Review:    CBC Recent Labs  Lab 02/26/18 1216 03/01/18 1004 03/04/18 0433  WBC 5.7 5.5 8.8  HGB 14.3 15.3 13.4  HCT 45.0 45.5 42.0  PLT 193 195 135*  MCV 93.0 91.4 93.1  MCH 29.5 30.7 29.7  MCHC 31.8 33.6 31.9  RDW 12.7 12.8 12.9  LYMPHSABS 1.1  --   --   MONOABS 0.4  --   --   EOSABS 0.1  --   --   BASOSABS 0.0  --   --     Chemistries  Recent Labs  Lab 02/26/18 1216 03/01/18 1004 03/04/18 0433  NA 138 138 139  K 3.7 3.8 4.1  CL 102 102 102  CO2 _1 GLUCOSE 76 54* 107*  BUN _2 CREATININE 1.07 0.97 1.07  CALCIUM 8.6* 8.9 8.6*  AST 27  --   --   ALT 22  --   --   ALKPHOS 67  --   --   BILITOT 0.5  --   --    ------------------------------------------------------------------------------------------------------------------ No results for input(s): CHOL, HDL, LDLCALC, TRIG, CHOLHDL, LDLDIRECT in the last 72 hours.  Lab Results  Component Value Date   HGBA1C 4.4 (L) 02/26/2018   ------------------------------------------------------------------------------------------------------------------ No results for input(s): TSH, T4TOTAL, T3FREE, THYROIDAB in the last 72 hours.  Invalid input(s): FREET3 ------------------------------------------------------------------------------------------------------------------ No results for input(s): VITAMINB12, FOLATE, FERRITIN, TIBC,  IRON, RETICCTPCT in the last 72 hours.  Coagulation profile Recent Labs  Lab 02/26/18 1216 03/04/18 0433  INR 1.06 1.13    No results for input(s): DDIMER in the last 72 hours.  Cardiac Enzymes Recent Labs  Lab 02/26/18 1329   TROPONINI <0.03   ------------------------------------------------------------------------------------------------------------------ No results found for: BNP  Inpatient Medications  Scheduled Meds: . aspirin  300 mg Rectal Daily   Or  . aspirin  325 mg Oral Daily  . docusate sodium  100 mg Oral BID  . enoxaparin (LOVENOX) injection  40 mg Subcutaneous Q24H  . famotidine  20 mg Oral QHS  . FLUoxetine  20 mg Oral Daily  . gabapentin  300 mg Oral TID  . levothyroxine  75 mcg Oral Q0600  . pantoprazole  20 mg Oral BID AC  . phenytoin  100 mg Oral Q48H  . phenytoin  200 mg Oral Daily  . phenytoin  200 mg Oral Q48H  . polyethylene glycol  17 g Oral Daily  . senna  1 tablet Oral BID  . sodium chloride flush  3 mL Intravenous Q12H   Continuous Infusions: . sodium chloride 10 mL/hr at 03/03/18 1310  . sodium chloride Stopped (03/03/18 1106)  . sodium chloride    . methocarbamol (ROBAXIN) IV 500 mg (03/04/18 1411)  . thiamine injection 500 mg (03/04/18 1114)   PRN Meds:.acetaminophen **OR** acetaminophen, albuterol, bisacodyl, dextromethorphan-guaiFENesin, HYDROcodone-acetaminophen, HYDROmorphone (DILAUDID) injection, LORazepam, menthol-cetylpyridinium **OR** phenol, methocarbamol **OR** methocarbamol (ROBAXIN) IV, morphine injection, ondansetron **OR** ondansetron (ZOFRAN) IV, oxyCODONE, senna-docusate, sodium chloride flush, sodium phosphate  Micro Results Recent Results (from the past 240 hour(s))  MRSA PCR Screening     Status: None   Collection Time: 03/02/18  8:41 PM  Result Value Ref Range Status   MRSA by PCR NEGATIVE NEGATIVE Final    Comment:        The GeneXpert MRSA Assay (FDA approved for NASAL specimens only), is one component of a comprehensive MRSA colonization surveillance program. It is not intended to diagnose MRSA infection nor to guide or monitor treatment for MRSA infections. Performed at [facility], [place] [address], [place] [employer] [place], [place] [address]      Radiology Reports Ct Angio Head W Or Wo Contrast  Result Date: 02/26/2018 CLINICAL DATA:  53 year old male with left leg shaking and weakness onset today. Prior left MCA infarct. EXAM: CT ANGIOGRAPHY HEAD AND NECK TECHNIQUE: Multidetector CT imaging of the head and neck was performed using the standard protocol during bolus administration of intravenous contrast. Multiplanar CT image reconstructions and MIPs were obtained to evaluate the vascular anatomy. Carotid stenosis measurements (when applicable) are obtained utilizing NASCET criteria, using the distal internal carotid diameter as the denominator. CONTRAST:  138m ISOVUE-370 IOPAMIDOL (ISOVUE-370) INJECTION 76% COMPARISON:  Head CT without contrast 1317 hours today. FINDINGS: CTA NECK Skeleton: Postoperative changes to the anterior mandible. Cervical spine dextroconvex scoliosis and cervicothoracic junction ankylosis. No acute osseous abnormality identified. Upper chest: Negative. Other neck: Thyromegaly. The largest thyroid nodule by CT is 11 millimeters which does not meet size criteria for follow-up. Otherwise negative neck soft tissues. Aortic arch: 3 vessel arch configuration with minimal arch atherosclerosis. Right carotid system: Negative. Left carotid system: Negative. Mildly tortuous left ICA just below the skull base. Vertebral arteries: Normal proximal right subclavian artery and right vertebral artery origin. The right vertebral is non dominant and diminutive but patent to the skull base without stenosis. No proximal left subclavian artery or left vertebral artery origin plaque  or stenosis. The left vertebral origin is somewhat shared with a left thyrocervical trunk (series 7, image 282, normal variant). The left vertebral is dominant and patent to the skull base without stenosis. CTA HEAD Posterior circulation: There is mild to moderate irregularity and stenosis of the dominant left vertebral artery as it crosses the dura on series 11,  image 29. No other distal vertebral stenosis. The left PICA origin is patent. There is similar mild irregularity and stenosis of the distal right vertebral artery as it crosses the dura. The right V4 segment appears occluded distal to the patent right PICA origin. The left vertebral supplies the basilar. Patent basilar artery without stenosis. Normal SCA and PCA origins. Fetal type left PCA origin. Bilateral PCA branches are within normal limits. Anterior circulation: Both ICA siphons are patent. The left siphon appears somewhat dominant with mild calcified plaque at the anterior genu but no stenosis. The right siphon is non dominant and the right supraclinoid segment appears somewhat tapered and diminutive without focal stenosis (series 9 image 88). Patent carotid termini. The left ACA A1 segment is dominant and the right A1 is diminutive. ACA origins, anterior communicating artery and bilateral ACA branches are within normal limits. The left MCA origin is mildly irregular without stenosis. The left M1 and bifurcation are patent without stenosis. The left MCA branches appear mildly irregular. The right MCA origin and M1 segment are patent without stenosis. The right MCA bifurcation and M2 segments are patent without stenosis. No right MCA branch occlusion is identified. There is asymmetric elevation of the left MCA branches owing to the left hemisphere encephalomalacia. Venous sinuses: Patent on the delayed images. Anatomic variants: Dominant left vertebral artery. Fetal type left PCA origin. Delayed phase: No abnormal enhancement identified. Stable supratentorial gray-white matter differentiation throughout the brain. There is suggestion of linear hypodensity in the right pons now on series 13, image 10. No associated hemorrhage or mass effect. Review of the MIP images confirms the above findings IMPRESSION: 1. Negative for large vessel occlusion in the anterior circulation but positive for occlusion of the  non-dominant distal Right Vertebral Artery just beyond the right PICA origin which is age indeterminate. 2. Delayed images suggest a linear lacunar infarct in the right pons. No associated hemorrhage or mass effect. 3. No atherosclerosis in the neck, but up to moderate stenosis of the dominant Left Vertebral Artery in the posterior fossa as it crosses the dura. 4. Dominant left ICA siphon with fetal type left PCA origin. The right supraclinoid ICA is diminutive but without focal stenosis. 5. Chronic Left MCA infarct with mild irregularity of the left MCA origin and left MCA branches without stenosis. Electronically Signed   By: Genevie Ann M.D.   On: 02/26/2018 16:50   Dg Chest 2 View  Result Date: 02/26/2018 CLINICAL DATA:  Mitral valve prolapse.  Tremors EXAM: CHEST - 2 VIEW COMPARISON:  May 13, 2017. FINDINGS: There is no evident edema or consolidation. The heart is upper normal in size with pulmonary vascularity normal. No adenopathy. No bone lesions. No radiopaque foreign bodies beyond overlying monitor leads. IMPRESSION: No edema or consolidation.  Stable cardiac silhouette. Electronically Signed   By: Lowella Grip III M.D.   On: 02/26/2018 16:20   Dg Cervical Spine 1 View  Result Date: 03/03/2018 CLINICAL DATA:  ACDF. EXAM: DG C-ARM 61-120 MIN; DG CERVICAL SPINE - 1 VIEW COMPARISON:  MRI 02/27/2018 FINDINGS: Changes of anterior fusion at C5-6. Normal alignment. No hardware complicating feature. IMPRESSION: ACDF  C5-6.  No visible complicating feature. Electronically Signed   By: Rolm Baptise M.D.   On: 03/03/2018 11:41   Ct Head Wo Contrast  Result Date: 02/26/2018 CLINICAL DATA:  LEFT leg weakness and dizziness for 1 day. EXAM: CT HEAD WITHOUT CONTRAST TECHNIQUE: Contiguous axial images were obtained from the base of the skull through the vertex without intravenous contrast. COMPARISON:  None. FINDINGS: Brain: No definite acute stroke, acute hemorrhage, mass lesion, hydrocephalus, or  extra-axial fluid. Advanced cerebellar atrophy. Extensive encephalomalacia LEFT hemisphere, frontotemporal, related to old stroke. Compensatory enlargement LEFT lateral ventricle. Mild to moderate premature cerebral atrophy. Hypoattenuation of white matter, likely small vessel disease. Vascular: Calcification of the cavernous internal carotid arteries consistent with cerebrovascular atherosclerotic disease. No emergent large vessel occlusion is evident. Skull: Normal. Negative for fracture or focal lesion. Sinuses/Orbits: No acute finding. Other: None. IMPRESSION: Chronic changes as described. Old LEFT MCA territory infarct. Cerebellar greater than cerebral atrophy. Small vessel disease. No acute intracranial findings. Electronically Signed   By: Staci Righter M.D.   On: 02/26/2018 13:33   Ct Angio Neck W And/or Wo Contrast  Result Date: 02/26/2018 CLINICAL DATA:  53 year old male with left leg shaking and weakness onset today. Prior left MCA infarct. EXAM: CT ANGIOGRAPHY HEAD AND NECK TECHNIQUE: Multidetector CT imaging of the head and neck was performed using the standard protocol during bolus administration of intravenous contrast. Multiplanar CT image reconstructions and MIPs were obtained to evaluate the vascular anatomy. Carotid stenosis measurements (when applicable) are obtained utilizing NASCET criteria, using the distal internal carotid diameter as the denominator. CONTRAST:  180m ISOVUE-370 IOPAMIDOL (ISOVUE-370) INJECTION 76% COMPARISON:  Head CT without contrast 1317 hours today. FINDINGS: CTA NECK Skeleton: Postoperative changes to the anterior mandible. Cervical spine dextroconvex scoliosis and cervicothoracic junction ankylosis. No acute osseous abnormality identified. Upper chest: Negative. Other neck: Thyromegaly. The largest thyroid nodule by CT is 11 millimeters which does not meet size criteria for follow-up. Otherwise negative neck soft tissues. Aortic arch: 3 vessel arch configuration  with minimal arch atherosclerosis. Right carotid system: Negative. Left carotid system: Negative. Mildly tortuous left ICA just below the skull base. Vertebral arteries: Normal proximal right subclavian artery and right vertebral artery origin. The right vertebral is non dominant and diminutive but patent to the skull base without stenosis. No proximal left subclavian artery or left vertebral artery origin plaque or stenosis. The left vertebral origin is somewhat shared with a left thyrocervical trunk (series 7, image 282, normal variant). The left vertebral is dominant and patent to the skull base without stenosis. CTA HEAD Posterior circulation: There is mild to moderate irregularity and stenosis of the dominant left vertebral artery as it crosses the dura on series 11, image 29. No other distal vertebral stenosis. The left PICA origin is patent. There is similar mild irregularity and stenosis of the distal right vertebral artery as it crosses the dura. The right V4 segment appears occluded distal to the patent right PICA origin. The left vertebral supplies the basilar. Patent basilar artery without stenosis. Normal SCA and PCA origins. Fetal type left PCA origin. Bilateral PCA branches are within normal limits. Anterior circulation: Both ICA siphons are patent. The left siphon appears somewhat dominant with mild calcified plaque at the anterior genu but no stenosis. The right siphon is non dominant and the right supraclinoid segment appears somewhat tapered and diminutive without focal stenosis (series 9 image 88). Patent carotid termini. The left ACA A1 segment is dominant and the right A1 is  diminutive. ACA origins, anterior communicating artery and bilateral ACA branches are within normal limits. The left MCA origin is mildly irregular without stenosis. The left M1 and bifurcation are patent without stenosis. The left MCA branches appear mildly irregular. The right MCA origin and M1 segment are patent without  stenosis. The right MCA bifurcation and M2 segments are patent without stenosis. No right MCA branch occlusion is identified. There is asymmetric elevation of the left MCA branches owing to the left hemisphere encephalomalacia. Venous sinuses: Patent on the delayed images. Anatomic variants: Dominant left vertebral artery. Fetal type left PCA origin. Delayed phase: No abnormal enhancement identified. Stable supratentorial gray-white matter differentiation throughout the brain. There is suggestion of linear hypodensity in the right pons now on series 13, image 10. No associated hemorrhage or mass effect. Review of the MIP images confirms the above findings IMPRESSION: 1. Negative for large vessel occlusion in the anterior circulation but positive for occlusion of the non-dominant distal Right Vertebral Artery just beyond the right PICA origin which is age indeterminate. 2. Delayed images suggest a linear lacunar infarct in the right pons. No associated hemorrhage or mass effect. 3. No atherosclerosis in the neck, but up to moderate stenosis of the dominant Left Vertebral Artery in the posterior fossa as it crosses the dura. 4. Dominant left ICA siphon with fetal type left PCA origin. The right supraclinoid ICA is diminutive but without focal stenosis. 5. Chronic Left MCA infarct with mild irregularity of the left MCA origin and left MCA branches without stenosis. Electronically Signed   By: Genevie Ann M.D.   On: 02/26/2018 16:50   Mr Brain Wo Contrast  Result Date: 02/26/2018 CLINICAL DATA:  Initial evaluation for acute left lower extremity shaking, gait instability. EXAM: MRI HEAD WITHOUT CONTRAST TECHNIQUE: Multiplanar, multiecho pulse sequences of the brain and surrounding structures were obtained without intravenous contrast. COMPARISON:  Prior CT and CTA from earlier the same day. FINDINGS: Brain: Moderately advanced cerebral and cerebellar atrophy with prominent vermian atrophy noted. Large area of cystic  encephalomalacia involving the peripheral left frontotemporal region, likely related to history of remote traumatic brain injury. Scattered chronic hemosiderin staining present within this region. Chronic hemosiderin staining also noted at the cerebellar vermis. No abnormal foci of restricted diffusion to suggest acute or subacute ischemia. Gray-white matter differentiation maintained. No other areas of remote cortical infarction. No evidence for acute intracranial hemorrhage. No mass lesion, midline shift or mass effect. No hydrocephalus. No extra-axial fluid collection. Pituitary gland normal. Vascular: Probable occluded right V4 segment, also seen on prior CTA. Major intracranial vascular flow voids otherwise maintained. Skull and upper cervical spine: Craniocervical junction normal. Bone marrow signal intensity within normal limits. No scalp soft tissue abnormality. Sinuses/Orbits: Globes and orbital soft tissues within normal limits. Paranasal sinuses are largely clear. Trace right mastoid effusion noted, of doubtful significance. Inner ear structures grossly normal. Other: None. IMPRESSION: 1. No acute intracranial abnormality. 2. Large area of cystic encephalomalacia involving the peripheral left frontotemporal region, most likely related to history of traumatic brain injury. 3. Moderately advanced cerebral and cerebellar atrophy, with prominent vermian atrophy. Electronically Signed   By: Jeannine Boga M.D.   On: 02/26/2018 19:34   Mr Cervical Spine Wo Contrast  Result Date: 02/27/2018 CLINICAL DATA:  Initial evaluation for acute myelopathy. EXAM: MRI CERVICAL SPINE WITHOUT CONTRAST TECHNIQUE: Multiplanar, multisequence MR imaging of the cervical spine was performed. No intravenous contrast was administered. COMPARISON:  None available. FINDINGS: Alignment: Examination technically limited as the  patient was unable to tolerate the full length of the exam. Sagittal T1, T2, and STIR sequences only  were performed. No axial images are provided. Straightening of the normal cervical lordosis. Trace 2 mm retrolisthesis of C5 on C6, likely chronic and facet mediated. Vertebrae: Vertebral body heights maintained without evidence for acute or chronic fracture. Bone marrow signal intensity within normal limits. No discrete or worrisome osseous lesions. Reactive marrow edema about the left C2-3 facet due to facet arthritis (series 5, image 18). No other abnormal marrow edema. Cord: Patchy T2 signal abnormality within the cervical spinal cord at C5-6 due to stenosis, compatible with edema and/or myelomalacia (series 3, image 9). Signal intensity within the cervical spinal cord otherwise grossly normal on this limited exam. Posterior Fossa, vertebral arteries, paraspinal tissues: Partially visualized brain and posterior fossa demonstrate no acute finding. Craniocervical junction normal. Paraspinous and prevertebral soft tissues grossly within normal limits. Probable enlarged multinodular goiter noted. Disc levels: C2-C3: No significant disc pathology. Left-sided facet arthrosis. No significant canal stenosis. C3-C4: No significant disc pathology. No significant spinal stenosis. C4-C5: No significant disc pathology. No significant spinal stenosis. C5-C6: Circumferential disc osteophyte with intervertebral disc space narrowing. Resultant severe spinal stenosis with impingement of the cervical spinal cord and cord signal changes. Thecal sac measures approximately 4 mm in AP diameter at its most narrow point. C6-C7: Mild annular disc bulge with disc desiccation. No significant spinal stenosis. C7-T1: C7 and T1 vertebral bodies are partially ankylosed. No significant stenosis. Visualized upper thoracic spine within normal limits. IMPRESSION: 1. Technically limited examination due to patient's inability to tolerate the full length of the study. Sagittal sequences only were performed. 2. Degenerative disc osteophyte at C5-6  with resultant severe spinal stenosis with impingement of the cervical spinal cord. Associated cord signal abnormality at this level compatible with edema and/or myelomalacia. 3. Advanced left-sided facet hypertrophy at C2-3 with associated reactive marrow edema. Finding could contribute to neck pain. Electronically Signed   By: Jeannine Boga M.D.   On: 02/27/2018 17:43   Mr Thoracic Spine Wo Contrast  Result Date: 02/28/2018 CLINICAL DATA:  Balance disturbance and left lower extremity tremor. EXAM: MRI THORACIC SPINE WITHOUT CONTRAST TECHNIQUE: Multiplanar, multisequence MR imaging of the thoracic spine was performed. No intravenous contrast was administered. COMPARISON:  None. FINDINGS: Alignment:  Physiologic. Vertebrae: No fracture, evidence of discitis, or bone lesion. Cord:  Normal signal and morphology. Paraspinal and other soft tissues: Negative. Disc levels: The axial images are motion degraded. T5-6: Small central disc protrusion with mild spinal canal stenosis. Right-greater-than-left facet hypertrophy. T12-L1: Left-greater-than-right facet hypertrophy. No spinal canal stenosis. There is no other significant disc herniation or spinal stenosis. IMPRESSION: 1. No acute abnormality of the thoracic spine. 2. Small central disc protrusion at T5-6 with mild spinal canal stenosis. Electronically Signed   By: Ulyses Jarred M.D.   On: 02/28/2018 21:56   Dg C-arm 1-60 Min  Result Date: 03/03/2018 CLINICAL DATA:  ACDF. EXAM: DG C-ARM 61-120 MIN; DG CERVICAL SPINE - 1 VIEW COMPARISON:  MRI 02/27/2018 FINDINGS: Changes of anterior fusion at C5-6. Normal alignment. No hardware complicating feature. IMPRESSION: ACDF C5-6.  No visible complicating feature. Electronically Signed   By: Rolm Baptise M.D.   On: 03/03/2018 11:41    Time Spent in minutes  25     M.D on 03/05/2018 at 9:44 AM  Between 7am to 7pm - Pager - [phone]  After 7pm go to [email or web address] - password [email or web address]  [facility] -  Office  [phone]

## 2018-03-05 NOTE — Progress Notes (Signed)
Modified Barium Swallow Progress Note  Patient Details  Name: Damon [name]. MRN: [id number] Date of Birth: 05-09-1965  Today's Date: 03/05/2018  Modified Barium Swallow completed.  Full report located under Chart Review in the Imaging Section.  Brief recommendations include the following:  Clinical Impression  Pt presents with a transient dysphagia due to prevertebral edema s/p ACDF.  Edema prevents full range of epiglottic closure and has narrowed the pyriform space.  There is aspiration of thin liquids before and after the swallow (spilling into posterior trachea between arytenoids). Thicker consistencies are unable to be passed successfully through UES, leading to residue post-swallow that pt expectorates.  He is on IV Decadron to help with the edema.  Recommend continued NPO, allow occasional ice chips.  Depending on time required for recovery, may need cortrak for a few days until swelling improves.  D/W pt and his mother; emphasized that temporary swallowing issues are not uncommon. They verbalize understanding.   Swallow Evaluation Recommendations       SLP Diet Recommendations: NPO;Ice chips PRN after oral care                       Oral Care Recommendations: Oral care QID      Damon [name], Damon [name] Office number [phone] Pager [phone]   Damon [name] 03/05/2018,11:51 AM

## 2018-03-05 NOTE — Plan of Care (Signed)
  Problem: Education: Goal: Knowledge of General Education information will improve Description Including pain rating scale, medication(s)/side effects and non-pharmacologic comfort measures Outcome: Progressing   Problem: Health Behavior/Discharge Planning: Goal: Ability to manage health-related needs will improve Outcome: Progressing   Problem: Clinical Measurements: Goal: Ability to maintain clinical measurements within normal limits will improve Outcome: Progressing Goal: Will remain free from infection Outcome: Progressing Goal: Diagnostic test results will improve Outcome: Progressing Goal: Respiratory complications will improve Outcome: Progressing Goal: Cardiovascular complication will be avoided Outcome: Progressing   Problem: Activity: Goal: Risk for activity intolerance will decrease Outcome: Progressing   Problem: Nutrition: Goal: Adequate nutrition will be maintained Outcome: Progressing   Problem: Coping: Goal: Level of anxiety will decrease Outcome: Progressing   Problem: Elimination: Goal: Will not experience complications related to bowel motility Outcome: Progressing Goal: Will not experience complications related to urinary retention Outcome: Progressing   Problem: Pain Managment: Goal: General experience of comfort will improve Outcome: Progressing   Problem: Safety: Goal: Ability to remain free from injury will improve Outcome: Progressing   Problem: Skin Integrity: Goal: Risk for impaired skin integrity will decrease Outcome: Progressing   Problem: Education: Goal: Knowledge of disease or condition will improve Outcome: Progressing Goal: Knowledge of secondary prevention will improve Outcome: Progressing Goal: Knowledge of patient specific risk factors addressed and post discharge goals established will improve Outcome: Progressing Goal: Individualized Educational Video(s) Outcome: Progressing   Problem: Coping: Goal: Will verbalize  positive feelings about self Outcome: Progressing Goal: Will identify appropriate support needs Outcome: Progressing   Problem: Health Behavior/Discharge Planning: Goal: Ability to manage health-related needs will improve Outcome: Progressing   Problem: Self-Care: Goal: Ability to participate in self-care as condition permits will improve Outcome: Progressing Goal: Verbalization of feelings and concerns over difficulty with self-care will improve Outcome: Progressing Goal: Ability to communicate needs accurately will improve Outcome: Progressing   Problem: Nutrition: Goal: Risk of aspiration will decrease Outcome: Progressing Goal: Dietary intake will improve Outcome: Progressing   Problem: Ischemic Stroke/TIA Tissue Perfusion: Goal: Complications of ischemic stroke/TIA will be minimized Outcome: Progressing   Problem: Spontaneous Subarachnoid Hemorrhage Tissue Perfusion: Goal: Complications of Spontaneous Subarachnoid Hemorrhage will be minimized Outcome: Progressing

## 2018-03-05 NOTE — Progress Notes (Signed)
Physical Therapy Treatment Patient Details Name: Damon [name]. MRN: [id number] DOB: 1965-12-02 Today's Date: 03/05/2018    History of Present Illness Pt is a 53 y/o male with medical history significant of TBI, L temporal lobe encephalomalacia, depression, deafness, seizure, MVP, GERD, who presents with left leg weakness and poor balance. MRI, CTA, EEG negative. Pt underwent ACDF 03/03/18.    PT Comments    Progressing well with mobility.  Practiced gait training with SPC, but still needs RW for optimal safety.   Follow Up Recommendations  Outpatient PT     Equipment Recommendations  None recommended by PT    Recommendations for Other Services       Precautions / Restrictions Precautions Precautions: Fall;Cervical Precaution Booklet Issued: Yes (comment) Precaution Comments: educated pt and his parents in cervical precautions Required Braces or Orthoses: Cervical Brace Cervical Brace: Hard collar;Other (comment)    Mobility  Bed Mobility Overal bed mobility: Needs Assistance             General bed mobility comments: OOB on arrival  Transfers Overall transfer level: Needs assistance   Transfers: Sit to/from Stand Sit to Stand: Min guard         General transfer comment: cues of better safety with cane  Ambulation/Gait Ambulation/Gait assistance: Min assist Gait Distance (Feet): 200 Feet(x2) Assistive device: Straight cane Gait Pattern/deviations: Step-through pattern   Gait velocity interpretation: 1.31 - 2.62 ft/sec, indicative of limited community ambulator General Gait Details: unsteadiness overall, but BOS is narrowing per family.  Foot contact bil in uncoordinated contact at heels which is jarring.  Uses cane appropriatelym, but is still safer with the RW at this time.   Stairs             Wheelchair Mobility    Modified Rankin (Stroke Patients Only)       Balance Overall balance assessment: Needs assistance   Sitting  balance-Leahy Scale: Good     Standing balance support: During functional activity;No upper extremity supported Standing balance-Leahy Scale: Fair                              Cognition Arousal/Alertness: Awake/alert Behavior During Therapy: WFL for tasks assessed/performed Overall Cognitive Status: History of cognitive impairments - at baseline                                        Exercises      General Comments General comments (skin integrity, edema, etc.): reinforced cervical education with pt and his Mom.      Pertinent Vitals/Pain Faces Pain Scale: Hurts little more Pain Location: neck Pain Descriptors / Indicators: Guarding Pain Intervention(s): Monitored during session    Home Living                      Prior Function            PT Goals (current goals can now be found in the care plan section) Acute Rehab PT Goals Patient Stated Goal: to go home PT Goal Formulation: With patient/family Time For Goal Achievement: 03/13/18 Potential to Achieve Goals: Good Progress towards PT goals: Progressing toward goals    Frequency    Min 5X/week      PT Plan Frequency needs to be updated    Co-evaluation  AM-PAC PT "6 Clicks" Mobility   Outcome Measure  Help needed turning from your back to your side while in a flat bed without using bedrails?: None Help needed moving from lying on your back to sitting on the side of a flat bed without using bedrails?: None Help needed moving to and from a bed to a chair (including a wheelchair)?: A Little Help needed standing up from a chair using your arms (e.g., wheelchair or bedside chair)?: A Little Help needed to walk in hospital room?: A Little Help needed climbing 3-5 steps with a railing? : A Little 6 Click Score: 20    End of Session   Activity Tolerance: Patient tolerated treatment well Patient left: in bed;with call bell/phone within reach;with bed  alarm set;with family/visitor present Nurse Communication: Mobility status PT Visit Diagnosis: Unsteadiness on feet (R26.81);Difficulty in walking, not elsewhere classified (R26.2)     Time: 7829-5621 PT Time Calculation (min) (ACUTE ONLY): 21 min  Charges:  $Gait Training: 8-22 mins                     03/05/2018  Donnella Sham, PT Acute Rehabilitation Services [phone]  (pager) [phone]  (office)   [name] 03/05/2018, 5:26 PM

## 2018-03-06 ENCOUNTER — Inpatient Hospital Stay (HOSPITAL_COMMUNITY): Payer: Medicare HMO

## 2018-03-06 MED ORDER — SODIUM CHLORIDE 0.9 % IV SOLN
3.0000 g | Freq: Three times a day (TID) | INTRAVENOUS | Status: DC
  Administered 2018-03-06 – 2018-03-10 (×12): 3 g via INTRAVENOUS
  Filled 2018-03-06 (×14): qty 3

## 2018-03-06 MED ORDER — DEXAMETHASONE SODIUM PHOSPHATE 10 MG/ML IJ SOLN
4.0000 mg | Freq: Four times a day (QID) | INTRAMUSCULAR | Status: DC
  Administered 2018-03-06 (×2): 4 mg via INTRAVENOUS
  Filled 2018-03-06 (×2): qty 1

## 2018-03-06 MED ORDER — DEXAMETHASONE SODIUM PHOSPHATE 10 MG/ML IJ SOLN
4.0000 mg | Freq: Two times a day (BID) | INTRAMUSCULAR | Status: DC
  Administered 2018-03-06 – 2018-03-10 (×8): 4 mg via INTRAVENOUS
  Filled 2018-03-06 (×8): qty 1

## 2018-03-06 NOTE — Care Management Important Message (Signed)
Important Message  Patient Details  Name: Damon [name]. MRN: [id number] Date of Birth: [birth year]   Medicare Important Message Given:  Yes    Annalea Alguire 03/06/2018, 3:04 PM
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
## 2018-03-06 NOTE — Progress Notes (Signed)
Pharmacy Antibiotic Note  Damon [name]. is a 53 y.o. male admitted on 02/26/2018 with left leg weakness and poor balance.  Pharmacy has been consulted for Unasyn dosing for aspiration PNA.  Renal function stable, afebrile, WBC WNL.  Plan: Unasyn 3gm IV Q8H Pharmacy will sign off and follow peripherally.  Thank you for the consult!   Height: 5\' 10"  (177.8 cm) Weight: 145 lb 4.5 oz (65.9 kg) IBW/kg (Calculated) : 73  Temp (24hrs), Avg:99.1 F (37.3 C), Min:97.6 F (36.4 C), Max:101 F (38.3 C)  Recent Labs  Lab 03/01/18 1004 03/04/18 0433  WBC 5.5 8.8  CREATININE 0.97 1.07    Estimated Creatinine Clearance: 74.4 mL/min (by C-G formula based on SCr of 1.07 mg/dL).    Allergies  Allergen Reactions  . Bactrim [Sulfamethoxazole-Trimethoprim] Rash     Damon [name] D. Mina Marble, PharmD, BCPS, [place] 03/06/2018, 1:34 PM
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
## 2018-03-06 NOTE — Progress Notes (Signed)
Physical Therapy Treatment Patient Details Name: Damon [name]. MRN: [id number] DOB: 1965/12/02 Today's Date: 03/06/2018    History of Present Illness Pt is a 53 y/o male with medical history significant of TBI, L temporal lobe encephalomalacia, depression, deafness, seizure, MVP, GERD, who presents with left leg weakness and poor balance. MRI, CTA, EEG negative. Pt underwent ACDF 03/03/18.    PT Comments    Steady improvement.  Emphasis on transfer safety and gait stability/stamina.  Pt is more steady with the RW than the [facility].   Follow Up Recommendations  Outpatient PT     Equipment Recommendations  None recommended by PT    Recommendations for Other Services       Precautions / Restrictions Precautions Precautions: Fall;Cervical Precaution Booklet Issued: Yes (comment) Precaution Comments: educated pt and his parents in cervical precautions Required Braces or Orthoses: Cervical Brace Cervical Brace: Hard collar;Other (comment) Restrictions Weight Bearing Restrictions: No    Mobility  Bed Mobility Overal bed mobility: Needs Assistance             General bed mobility comments: OOB on arrival  Transfers Overall transfer level: Needs assistance Equipment used: Rolling walker (2 wheeled) Transfers: Sit to/from Stand Sit to Stand: Min guard         General transfer comment: cues for hand placement  Ambulation/Gait Ambulation/Gait assistance: Min assist Gait Distance (Feet): 400 Feet Assistive device: Rolling walker (2 wheeled) Gait Pattern/deviations: Step-through pattern Gait velocity: moderate Gait velocity interpretation: 1.31 - 2.62 ft/sec, indicative of limited community ambulator General Gait Details: mildly unsteady overall with RW "waggle" down the hall.  cues for posture and cues for improving heel toe pattern.   Stairs             Wheelchair Mobility    Modified Rankin (Stroke Patients Only)       Balance Overall balance  assessment: Needs assistance   Sitting balance-Leahy Scale: Good     Standing balance support: During functional activity;No upper extremity supported Standing balance-Leahy Scale: Fair                              Cognition Arousal/Alertness: Awake/alert Behavior During Therapy: WFL for tasks assessed/performed Overall Cognitive Status: History of cognitive impairments - at baseline                                 General Comments: parents present and supportive      Exercises      General Comments        Pertinent Vitals/Pain Pain Assessment: Faces Faces Pain Scale: No hurt Pain Intervention(s): Monitored during session    Home Living                      Prior Function            PT Goals (current goals can now be found in the care plan section) Acute Rehab PT Goals Patient Stated Goal: to go home PT Goal Formulation: With patient/family Time For Goal Achievement: 03/13/18 Potential to Achieve Goals: Good Progress towards PT goals: Progressing toward goals    Frequency    Min 5X/week      PT Plan Current plan remains appropriate    Co-evaluation              AM-PAC PT "6 Clicks" Mobility   Outcome Measure  Help needed turning from your back to your side while in a flat bed without using bedrails?: None Help needed moving from lying on your back to sitting on the side of a flat bed without using bedrails?: None Help needed moving to and from a bed to a chair (including a wheelchair)?: A Little Help needed standing up from a chair using your arms (e.g., wheelchair or bedside chair)?: A Little Help needed to walk in hospital room?: A Little Help needed climbing 3-5 steps with a railing? : A Little 6 Click Score: 20    End of Session   Activity Tolerance: Patient tolerated treatment well Patient left: with call bell/phone within reach;with family/visitor present;in chair;with chair alarm set Nurse  Communication: Mobility status PT Visit Diagnosis: Unsteadiness on feet (R26.81);Difficulty in walking, not elsewhere classified (R26.2)     Time: 8185-6314 PT Time Calculation (min) (ACUTE ONLY): 14 min  Charges:  $Gait Training: 8-22 mins                     03/06/2018  Donnella Sham, PT Acute Rehabilitation Services [phone]  (pager) [phone]  (office)   Tessie Fass Sharday Michl 03/06/2018, 6:29 PM
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
## 2018-03-06 NOTE — Progress Notes (Signed)
Occupational Therapy Treatment Patient Details Name: Damon [name]. MRN: [id number] DOB: 1965-07-13 Today's Date: 03/06/2018    History of present illness Pt is a 53 y/o male with medical history significant of TBI, L temporal lobe encephalomalacia, depression, deafness, seizure, MVP, GERD, who presents with left leg weakness and poor balance. MRI, CTA, EEG negative. Pt underwent ACDF 03/03/18.   OT comments  Pt progressing towards OT goals and motivated to work with therapy. Pt performing functional mobility in room and hallway using RW with minA. Pt with one minor LOB during mobility requiring minA to correct. Pt demonstrates LB ADL at overall minguard assist level. Reviewed cervical precautions with pt and pt's mother with pt requiring min cues to maintain during session. Feel POC remains appropriate at this time. Will continue to follow acutely to progress pt towards established OT goals.   Follow Up Recommendations  No OT follow up    Equipment Recommendations  None recommended by OT          Precautions / Restrictions Precautions Precautions: Fall;Cervical Precaution Booklet Issued: Yes (comment) Precaution Comments: educated pt and his parents in cervical precautions Required Braces or Orthoses: Cervical Brace Cervical Brace: Hard collar;Other (comment) Restrictions Weight Bearing Restrictions: No       Mobility Bed Mobility               General bed mobility comments: OOB on arrival  Transfers Overall transfer level: Needs assistance   Transfers: Sit to/from Stand Sit to Stand: Min guard         General transfer comment: cues for hand placement    Balance Overall balance assessment: Needs assistance   Sitting balance-Leahy Scale: Good     Standing balance support: During functional activity;No upper extremity supported Standing balance-Leahy Scale: Fair                             ADL either performed or assessed with clinical  judgement   ADL Overall ADL's : Needs assistance/impaired       Grooming Details (indicate cue type and reason): reviewed use of two cup method with pt's mother, mother reports has been using with pt to perform         Upper Body Dressing : Sitting;Set up;Minimal assistance Upper Body Dressing Details (indicate cue type and reason): donning additional gown Lower Body Dressing: Min guard;Sit to/from stand Lower Body Dressing Details (indicate cue type and reason): pt performing figure 4  technique to adjust socks and to don shoes              Functional mobility during ADLs: Minimal assistance;Rolling walker General ADL Comments: pt requesting to perform functional mobility in hallway as part of session; overall minA with RW with one minor LOB requiring minA to correct; requires cues for pathfinding to return to room     Vision       Perception     Praxis      Cognition Arousal/Alertness: Awake/alert Behavior During Therapy: [facility] for tasks assessed/performed Overall Cognitive Status: History of cognitive impairments - at baseline                                          Exercises     Shoulder Instructions       General Comments      Pertinent Vitals/ Pain  Pain Assessment: Faces Faces Pain Scale: No hurt Pain Intervention(s): Monitored during session  Home Living                                          Prior Functioning/Environment              Frequency  Min 2X/week        Progress Toward Goals  OT Goals(current goals can now be found in the care plan section)  Progress towards OT goals: Progressing toward goals  Acute Rehab OT Goals Patient Stated Goal: to go home OT Goal Formulation: With patient Time For Goal Achievement: 03/13/18 Potential to Achieve Goals: Good  Plan Discharge plan remains appropriate    Co-evaluation                 AM-PAC OT "6 Clicks" Daily Activity     Outcome  Measure   Help from another person eating meals?: None Help from another person taking care of personal grooming?: A Little Help from another person toileting, which includes using toliet, bedpan, or urinal?: A Little Help from another person bathing (including washing, rinsing, drying)?: A Little Help from another person to put on and taking off regular upper body clothing?: None Help from another person to put on and taking off regular lower body clothing?: A Little 6 Click Score: 20    End of Session Equipment Utilized During Treatment: Gait belt;Rolling walker;Cervical collar  OT Visit Diagnosis: Other abnormalities of gait and mobility (R26.89);Muscle weakness (generalized) (M62.81)   Activity Tolerance Patient tolerated treatment well   Patient Left in chair;with call bell/phone within reach;with chair alarm set;with family/visitor present   Nurse Communication Mobility status        Time: 0092-3300 OT Time Calculation (min): 23 min  Charges: OT General Charges $OT Visit: 1 Visit OT Treatments $Therapeutic Activity: 8-22 mins  Lou Cal, OT Supplemental Rehabilitation Services Pager [phone] Office [phone]    Raymondo Band 03/06/2018, 4:40 PM

## 2018-03-07 LAB — GLUCOSE, CAPILLARY
Glucose-Capillary: 81 mg/dL (ref 70–99)
Glucose-Capillary: 96 mg/dL (ref 70–99)

## 2018-03-07 MED ORDER — ASPIRIN 325 MG PO TABS
325.0000 mg | ORAL_TABLET | Freq: Every day | ORAL | Status: DC
  Administered 2018-03-08 – 2018-03-10 (×3): 325 mg
  Filled 2018-03-07 (×3): qty 1

## 2018-03-07 MED ORDER — GABAPENTIN 250 MG/5ML PO SOLN
300.0000 mg | Freq: Three times a day (TID) | ORAL | Status: DC
  Administered 2018-03-07 – 2018-03-10 (×7): 300 mg via ORAL
  Filled 2018-03-07 (×11): qty 6

## 2018-03-07 MED ORDER — SENNA 8.6 MG PO TABS
1.0000 | ORAL_TABLET | Freq: Two times a day (BID) | ORAL | Status: DC
  Administered 2018-03-07 – 2018-03-10 (×5): 8.6 mg
  Filled 2018-03-07 (×5): qty 1

## 2018-03-07 MED ORDER — FREE WATER
100.0000 mL | Freq: Three times a day (TID) | Status: DC
  Administered 2018-03-07 – 2018-03-08 (×5): 100 mL

## 2018-03-07 MED ORDER — ASPIRIN 300 MG RE SUPP: 300.0000 mg | Freq: Every day | RECTAL | Status: DC

## 2018-03-07 MED ORDER — OSMOLITE 1.2 CAL PO LIQD
1000.0000 mL | ORAL | Status: DC
  Administered 2018-03-07 – 2018-03-09 (×5): 1000 mL
  Filled 2018-03-07 (×4): qty 1000

## 2018-03-07 MED ORDER — DEXTROSE-NACL 5-0.45 % IV SOLN: INTRAVENOUS | Status: DC

## 2018-03-07 MED ORDER — FLUOXETINE HCL 20 MG/5ML PO SOLN
20.0000 mg | Freq: Every day | ORAL | Status: DC
  Administered 2018-03-08 – 2018-03-10 (×3): 20 mg via ORAL
  Filled 2018-03-07 (×3): qty 5

## 2018-03-07 MED ORDER — PANTOPRAZOLE SODIUM 40 MG PO PACK
20.0000 mg | PACK | Freq: Two times a day (BID) | ORAL | Status: DC
  Administered 2018-03-07 – 2018-03-10 (×6): 20 mg
  Filled 2018-03-07 (×6): qty 20

## 2018-03-07 MED ORDER — DIPHENHYDRAMINE HCL 50 MG/ML IJ SOLN
25.0000 mg | Freq: Every day | INTRAMUSCULAR | Status: AC
  Administered 2018-03-07 – 2018-03-09 (×3): 25 mg via INTRAVENOUS
  Filled 2018-03-07 (×3): qty 1

## 2018-03-07 MED ORDER — FAMOTIDINE 20 MG PO TABS
20.0000 mg | ORAL_TABLET | Freq: Every day | ORAL | Status: DC
  Administered 2018-03-07 – 2018-03-08 (×2): 20 mg
  Filled 2018-03-07 (×2): qty 1

## 2018-03-07 MED ORDER — JEVITY 1.2 CAL PO LIQD: 1000.0000 mL | ORAL | Status: DC

## 2018-03-07 NOTE — Progress Notes (Signed)
Attending MD, Dr. Anselmo Pickler at bedside order D51/2 NS at 88ml/hr at this time. Family /patient updated with POC.  Ave Filter, RN

## 2018-03-07 NOTE — Procedures (Signed)
Cortrak  Person Inserting Tube:  Rosezetta Schlatter, RD Tube Type:  Cortrak - 43 inches Tube Location:  Left nare Initial Placement:  Stomach Secured by: Bridle Technique Used to Measure Tube Placement:  Documented cm marking at nare/ corner of mouth Cortrak Secured At:  60 cm    Cortrak Tube Team Note:  Consult received to place a Cortrak feeding tube.   No x-ray is required. RN may begin using tube.   If the tube becomes dislodged please keep the tube and contact the Cortrak team at [email or web address] (password [email or web address]) for replacement.  If after hours and replacement cannot be delayed, place a NG tube and confirm placement with an abdominal x-ray.      Jarome Matin, MS, RD, LDN, [facility] Inpatient Clinical Dietitian Pager # [phone] After hours/weekend pager # [phone]
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
## 2018-03-07 NOTE — Progress Notes (Signed)
PROGRESS NOTE  Damon [name] [place]. OEV:[id number] DOB: 08/27/1965 DOA: 02/26/2018 PCP: [name], DO   LOS: 8 days   Brief Narrative / Interim history: 53 year old male with history of traumatic brain injury, seizures, deafness, depression, hypothyroidism, GERD and MDP presenting with left leg shaking and poor balance.  MRI of the cervical spine showed significant cord compression at the level of C5-C6.  Seen by neurosurgery with status post surgical intervention on 03/03/18.  Subjective: Mother is at bedside able to translate with sign language, no new concerns  Assessment & Plan: Principal Problem:   TIA (transient ischemic attack) Active Problems:   Hypothyroidism   Seizure disorder ([place])   Hyperlipidemia   GERD (gastroesophageal reflux disease)   Communication disability- deaf -  needs extra time for communication   Depression   Left leg weakness   Spells of trembling   Spinal stenosis in cervical region   Cervical nerve root impingement   Leg weakness, bilateral   Hyperreflexia   Spastic   TBI (traumatic brain injury) ([place])   B12 deficiency   Principal Problem: Acute gait instability with C5-C6 cord compression -Underwent C5-6 discectomy for decompression of spinal cord. placement of intervertebral biomechanical deviceMedtronic PTC PEEK 56m lordotic cage. -Tolerated surgery well. PT eval. Further recs per neurosurgery. -Noted for low B12 (115).  Given subcu dose on 12/30. - postop   patient having difficulty swallowing, neck appears swollen.  No stridor or drooling from the mouth. -Patient was started on Decadron at neurosurgery recommendations, however it appears that this medication was discontinued on 1/2. resumed on 1/3 and continue management per neurosurgery -Patient repeatedly failed swallow eval, c/n feeding via Cortrac  Active Problems: Fever -Fever resolving,  cough   with copious mucus production, chest x-ray fairly unremarkable but high suspicion  for aspiration and okay to continue Unasyn  FEN/moderate protein caloric malnutrition,  c/n feeding via Cortrac  [place] without seizure activity.  Continue Dilantin.  Hypothyroidism -Continue Synthroid.  TSH normal.  History of depression -Continue Prozac  Constipation -Continue senna, Colace and MiraLAX.  GERD -Continue PPI  TBI (traumatic brain injury) (HCC)----at baseline   Scheduled Meds: . [START ON 03/08/2018] aspirin  325 mg Per Tube Daily   Or  . [START ON 03/08/2018] aspirin  300 mg Rectal Daily  . dexamethasone  4 mg Intravenous Q12H  . diphenhydrAMINE  25 mg Intravenous QHS  . enoxaparin (LOVENOX) injection  40 mg Subcutaneous Q24H  . famotidine  20 mg Per Tube QHS  . [START ON 03/08/2018] FLUoxetine  20 mg Oral Daily  . free water  100 mL Per Tube Q8H  . gabapentin  300 mg Oral TID  . levothyroxine  50 mcg Intravenous Daily  . pantoprazole sodium  20 mg Per Tube BID  . phenytoin (DILANTIN) IV  100 mg Intravenous Q48H  . polyethylene glycol  17 g Oral Daily  . senna  1 tablet Per Tube BID  . sodium chloride flush  3 mL Intravenous Q12H   Continuous Infusions: . sodium chloride Stopped (03/05/18 1108)  . sodium chloride Stopped (03/03/18 1106)  . ampicillin-sulbactam (UNASYN) IV 3 g (03/07/18 1508)  . feeding supplement (OSMOLITE 1.2 CAL) 1,000 mL (03/07/18 1637)  . methocarbamol (ROBAXIN) IV 500 mg (03/05/18 2026)  . phenytoin (DILANTIN) IV 200 mg (03/07/18 1030)  . phenytoin (DILANTIN) IV 200 mg (03/06/18 2259)  . thiamine injection 500 mg (03/07/18 0934)   PRN Meds:.acetaminophen **OR** acetaminophen, albuterol, bisacodyl, dextromethorphan-guaiFENesin, HYDROcodone-acetaminophen, HYDROmorphone (DILAUDID)  injection, LORazepam, menthol-cetylpyridinium **OR** phenol, methocarbamol **OR** methocarbamol (ROBAXIN) IV, morphine injection, ondansetron **OR** ondansetron (ZOFRAN) IV, oxyCODONE, senna-docusate, sodium chloride flush, sodium  phosphate  DVT prophylaxis: Lovenox Code Status: Full code Family Communication: Mother present at bedside Disposition Plan: To be determined once feeding issues resolve  Consultants:   Neurosurgery  Procedures:   12/32 1. Discectomy at C5-6 for decompression of spinal cord and exiting nerve roots  2. Placement of intervertebral biomechanical device Medtronic PTC PEEK 65m lordotic cage 3. Placement of anterior instrumentation consisting of interbody plate and screws - Medtronic Zevo plate, 130mscrews  4. Use of morselized bone allograft  5. Arthrodesis C5-6, anterior interbody technique  6. Use of intraoperative microscope  Antimicrobials:  Unasyn 1/3 >>   Objective: Vitals:   03/06/18 2332 03/07/18 0837 03/07/18 1137 03/07/18 1619  BP: 122/69 140/75 131/63 140/77  Pulse: 73 67 78 74  Resp:  _0 Temp: 98.2 F (36.8 C) 97.7 F (36.5 C) 97.8 F (36.6 C) (!) 97.5 F (36.4 C)  TempSrc: Oral Oral Oral Oral  SpO2: 93% 100% 94% 95%  Weight:      Height:        Intake/Output Summary (Last 24 hours) at 03/07/2018 2023 Last data filed at 03/07/2018 1900 Gross per 24 hour  Intake 433.42 ml  Output -  Net 433.42 ml   Filed Weights   02/26/18 1204 02/27/18 0403  Weight: 64.4 kg 65.9 kg    Physical Exam  Patient is examined daily including today on 03/07/18  , exams remain the same as of yesterday except that has changed   Gen:- Awake Alert, nonverbal, uses sign language HEENT:- [place].AT, No sclera icterus Neck- Neck Collar.  Lungs-improving air movement, few scattered rhonchi CV- S1, S2 normal Abd-  +ve B.Sounds, Abd Soft, No tenderness,    Extremity/Skin:- No  edema, good pulses Psych-affect is appropriate, oriented x3, some cognitive deficits Neuro-no new focal deficits, no tremors  Data Reviewed: I have independently reviewed following labs and imaging studies   CBC: Recent Labs  Lab 03/01/18 1004 03/04/18 0433  WBC 5.5 8.8  HGB 15.3 13.4  HCT 45.5  42.0  MCV 91.4 93.1  PLT 195 13446  Basic Metabolic Panel: Recent Labs  Lab 03/01/18 1004 03/04/18 0433  NA 138 139  K 3.8 4.1  CL 102 102  CO2 27 29  GLUCOSE 54* 107*  BUN 9 10  CREATININE 0.97 1.07  CALCIUM 8.9 8.6*   GFR: Estimated Creatinine Clearance: 74.4 mL/min (by C-G formula based on SCr of 1.07 mg/dL). Liver Function Tests: No results for input(s): AST, ALT, ALKPHOS, BILITOT, PROT, ALBUMIN in the last 168 hours. No results for input(s): LIPASE, AMYLASE in the last 168 hours. No results for input(s): AMMONIA in the last 168 hours. Coagulation Profile: Recent Labs  Lab 03/04/18 0433  INR 1.13   Cardiac Enzymes: No results for input(s): CKTOTAL, CKMB, CKMBINDEX, TROPONINI in the last 168 hours. BNP (last 3 results) No results for input(s): PROBNP in the last 8760 hours. HbA1C: No results for input(s): HGBA1C in the last 72 hours. CBG: Recent Labs  Lab 03/07/18 1617  GLUCAP 81   Lipid Profile: No results for input(s): CHOL, HDL, LDLCALC, TRIG, CHOLHDL, LDLDIRECT in the last 72 hours. Thyroid Function Tests: No results for input(s): TSH, T4TOTAL, FREET4, T3FREE, THYROIDAB in the last 72 hours. Anemia Panel: No results for input(s): VITAMINB12, FOLATE, FERRITIN, TIBC, IRON, RETICCTPCT in the last 72 hours. Urine analysis:  Component Value Date/Time   COLORURINE STRAW (A) 02/26/2018 1330   APPEARANCEUR CLEAR 02/26/2018 1330   LABSPEC 1.003 (L) 02/26/2018 1330   PHURINE 6.0 02/26/2018 1330   GLUCOSEU NEGATIVE 02/26/2018 1330   HGBUR NEGATIVE 02/26/2018 1330   HGBUR negative 07/09/2007 1417   BILIRUBINUR NEGATIVE 02/26/2018 1330   BILIRUBINUR n 12/07/2014 1314   KETONESUR NEGATIVE 02/26/2018 1330   PROTEINUR NEGATIVE 02/26/2018 1330   UROBILINOGEN 0.2 12/07/2014 1314   UROBILINOGEN 1.0 07/09/2007 1417   NITRITE NEGATIVE 02/26/2018 1330   LEUKOCYTESUR NEGATIVE 02/26/2018 1330   Sepsis Labs: Invalid input(s): PROCALCITONIN, LACTICIDVEN  Recent  Results (from the past 240 hour(s))  MRSA PCR Screening     Status: None   Collection Time: 03/02/18  8:41 PM  Result Value Ref Range Status   MRSA by PCR NEGATIVE NEGATIVE Final    Comment:        The GeneXpert MRSA Assay (FDA approved for NASAL specimens only), is one component of a comprehensive MRSA colonization surveillance program. It is not intended to diagnose MRSA infection nor to guide or monitor treatment for MRSA infections. Performed at [facility], [place] [address], [place], [place] [address]       Radiology Studies: Dg Chest Port 1 View  Result Date: 03/06/2018 CLINICAL DATA:  Cough. EXAM: PORTABLE CHEST 1 VIEW COMPARISON:  02/26/2018. FINDINGS: Mediastinum and hilar structures normal. Heart size normal. Low lung volumes with mild basilar atelectasis. Biapical pleural thickening noted consistent scarring. No pleural effusion or pneumothorax. IMPRESSION: Low lung volumes with mild bibasilar atelectasis. Electronically Signed   By: Marcello Moores  Register   On: 03/06/2018 08:19   Damon [name] Denton Brick, MD  [facility]  If 7PM-7AM, please contact night-coverage [email or web address] Password [facility] 03/07/2018, 8:23 PM

## 2018-03-07 NOTE — Progress Notes (Signed)
Cortrak team notify of order to place feeding tube. Per attending MD to discontinue dextrose sol if pt is getting cortrak in today.  Family updated.    Ave Filter, RN

## 2018-03-07 NOTE — Progress Notes (Signed)
Initial Nutrition Assessment  DOCUMENTATION CODES:   Non-severe (moderate) malnutrition in context of chronic illness  INTERVENTION:  - Will order TF: Osmolite 1.2 @ 35 mL/hr to advance by 10 mL every 4 hours to reach goal rate of Osmolite 1.2 @ 65 mL/hr.  - At goal rate, this regimen will provide 1872 kcal, 86 grams of protein, and 1279 mL free water. - Will order 100 mL free water TID.  NUTRITION DIAGNOSIS:   Moderate Malnutrition related to chronic illness as evidenced by mild fat depletion, mild muscle depletion, moderate muscle depletion.  GOAL:   Patient will meet greater than or equal to 90% of their needs  MONITOR:   TF tolerance, Weight trends, Labs  REASON FOR ASSESSMENT:   Other (Comment)(Consult for Cortrak, not being followed.)  ASSESSMENT:   53 year old male with history of TBI, seizures, deafness, depression, hypothyroidism, GERD, and MDP. He presented to the ED with L leg shaking and poor balance.  MRI of the cervical spine showed significant cord compression at the level of C5-C6. Seen by neurosurgery and is s/p surgical intervention on 12/31.  Patient was NPO 12/26-12/27 when he was advanced to Heart Healthy and ate 100% of breakfast and 25% of lunch. He consumed 100% of dinner on Regular diet on 12/30. He was then NPO on 12/31 AM and then changed to CLD and further advanced to Heart Healthy on 1/1. No intakes documented after 12/30. Patient has been NPO since 1/1.  Cortrak now in place. SLP is followin and last saw patient 1/3 AM and recommendation was made to continue NPO status.   Patient is deaf and communicates with sign language. Mom was at bedside and was able to provide information, as patient lives with her, and translate for RD and patient. At home, patient had a very good appetite and was eating without chewing or swallowing difficulties.   Per chart review, current weight is 145 lb and weight has been stable since July. Patient requesting ice cream  throughout RD visit and placement of Cortrak. Mom reports that order is in for patient to be able to have ice cubes every hour. He has been tolerating and enjoying these.   Spoke with Dr. Joesph Fillers and he stated okay for RD to order TF via Cortrak tube.    Medications reviewed; 100 mg Colace BID, 50 mcg IV Synthroid/day, IV dilantin, 1 tablet senokot BID, 500 mg IV thiamine/day. Labs reviewed; Ca: 8.6 mg/dL.      NUTRITION - FOCUSED PHYSICAL EXAM:    Most Recent Value  Orbital Region  No depletion  Upper Arm Region  Mild depletion  Thoracic and Lumbar Region  No depletion  Buccal Region  Mild depletion  Temple Region  Mild depletion  Clavicle Bone Region  Mild depletion  Clavicle and Acromion Bone Region  Mild depletion  Scapular Bone Region  No depletion  Dorsal Hand  Mild depletion  Patellar Region  Mild depletion  Anterior Thigh Region  Moderate depletion  Posterior Calf Region  Moderate depletion  Edema (RD Assessment)  None  Hair  Reviewed  Eyes  Reviewed  Mouth  Reviewed  Skin  Reviewed  Nails  Reviewed       Diet Order:   Diet Order            Diet NPO time specified  Diet effective now              EDUCATION NEEDS:   No education needs have been identified at this time  Skin:  Skin Assessment: Reviewed RN Assessment  Last BM:  1/2  Height:   Ht Readings from Last 1 Encounters:  02/27/18 5\' 10"  (1.778 m)    Weight:   Wt Readings from Last 1 Encounters:  02/27/18 65.9 kg    Ideal Body Weight:  75.45 kg  BMI:  Body mass index is 20.85 kg/m.  Estimated Nutritional Needs:   Kcal:  1845-2045 kcal  Protein:  70-80 grams  Fluid:  >/= 1.8 L/day     Jarome Matin, MS, RD, LDN, [facility] Inpatient Clinical Dietitian Pager # [phone] After hours/weekend pager # [phone]

## 2018-03-08 DIAGNOSIS — E440 Moderate protein-calorie malnutrition: Secondary | ICD-10-CM

## 2018-03-08 LAB — GLUCOSE, CAPILLARY
GLUCOSE-CAPILLARY: 146 mg/dL — AB (ref 70–99)
GLUCOSE-CAPILLARY: 140 mg/dL — AB (ref 70–99)
GLUCOSE-CAPILLARY: 160 mg/dL — AB (ref 70–99)
Glucose-Capillary: 112 mg/dL — ABNORMAL HIGH (ref 70–99)
Glucose-Capillary: 136 mg/dL — ABNORMAL HIGH (ref 70–99)
Glucose-Capillary: 141 mg/dL — ABNORMAL HIGH (ref 70–99)
Glucose-Capillary: 98 mg/dL (ref 70–99)

## 2018-03-08 MED ORDER — INSULIN ASPART 100 UNIT/ML ~~LOC~~ SOLN: 0.0000 [IU] | Freq: Three times a day (TID) | SUBCUTANEOUS | Status: DC

## 2018-03-08 MED ORDER — PHENYTOIN 125 MG/5ML PO SUSP
200.0000 mg | Freq: Every day | ORAL | Status: DC
  Administered 2018-03-09 – 2018-03-10 (×2): 200 mg
  Filled 2018-03-08 (×2): qty 8

## 2018-03-08 MED ORDER — PHENYTOIN 125 MG/5ML PO SUSP
100.0000 mg | ORAL | Status: DC
  Administered 2018-03-09: 100 mg via ORAL
  Filled 2018-03-08: qty 4

## 2018-03-08 MED ORDER — PHENYTOIN 125 MG/5ML PO SUSP
200.0000 mg | ORAL | Status: DC
  Administered 2018-03-08: 200 mg
  Filled 2018-03-08: qty 8

## 2018-03-08 MED ORDER — INSULIN ASPART 100 UNIT/ML ~~LOC~~ SOLN: 0.0000 [IU] | Freq: Every day | SUBCUTANEOUS | Status: DC

## 2018-03-08 MED ORDER — VITAMIN B-1 100 MG PO TABS
100.0000 mg | ORAL_TABLET | Freq: Every day | ORAL | Status: DC
  Administered 2018-03-09 – 2018-03-10 (×2): 100 mg via ORAL
  Filled 2018-03-08 (×2): qty 1

## 2018-03-08 NOTE — Progress Notes (Signed)
PROGRESS NOTE  Jemar R [place]. JXB:[id number] DOB: March 31, 1965 DOA: 02/26/2018 PCP: [name], DO   LOS: 9 days   Brief Narrative / Interim history: 53 year old male with history of traumatic brain injury, seizures, deafness, depression, hypothyroidism, GERD and MDP presenting with left leg shaking and poor balance.  MRI of the cervical spine showed significant cord compression at the level of C5-C6.  Seen by neurosurgery with status post surgical intervention on 03/03/18.  Subjective: Mother and dad at bedside able to translate with sign language, tolerating tube feeding okay Assessment & Plan: Principal Problem:   TIA (transient ischemic attack) Active Problems:   Hypothyroidism   Seizure disorder ([place])   Hyperlipidemia   GERD (gastroesophageal reflux disease)   Communication disability- deaf -  needs extra time for communication   Depression   Left leg weakness   Spells of trembling   Spinal stenosis in cervical region   Cervical nerve root impingement   Leg weakness, bilateral   Hyperreflexia   Spastic   TBI (traumatic brain injury) ([place])   B12 deficiency   Malnutrition of moderate degree   Principal Problem: Acute gait instability with C5-C6 cord compression -Underwent C5-6 discectomy for decompression of spinal cord. placement of intervertebral biomechanical deviceMedtronic PTC PEEK 89m lordotic cage. -Tolerated surgery well. PT eval. Further recs per neurosurgery. -Noted for low B12 (115).  Given subcu dose on 12/30. - postop   patient having difficulty swallowing, neck appears swollen.  No stridor or drooling from the mouth. -Patient was started on Decadron at neurosurgery recommendations, however it appears that this medication was discontinued on 1/2. resumed on 1/3 and continue management per neurosurgery -Patient repeatedly failed swallow eval, c/n feeding via Cortrac  Active Problems: Fever -Fever resolving,  cough   with copious mucus production,  chest x-ray fairly unremarkable but high suspicion for aspiration and okay to continue Unasyn  FEN/moderate protein caloric malnutrition-.   c/n feeding via Cortrac, use sliding scale insulin while on steroids/Decadron  [place] without seizure activity.  Continue Dilantin.  Hypothyroidism -Continue Synthroid.  TSH normal.  History of depression -Continue Prozac  Constipation -Continue senna, Colace and MiraLAX.  GERD -Continue PPI  TBI (traumatic brain injury) ([facility])----at baseline   Scheduled Meds: . aspirin  325 mg Per Tube Daily   Or  . aspirin  300 mg Rectal Daily  . dexamethasone  4 mg Intravenous Q12H  . diphenhydrAMINE  25 mg Intravenous QHS  . enoxaparin (LOVENOX) injection  40 mg Subcutaneous Q24H  . famotidine  20 mg Per Tube QHS  . FLUoxetine  20 mg Oral Daily  . free water  100 mL Per Tube Q8H  . gabapentin  300 mg Oral TID  . levothyroxine  50 mcg Intravenous Daily  . pantoprazole sodium  20 mg Per Tube BID  . [START ON 03/09/2018] phenytoin  100 mg Oral Q48H  . [START ON 03/09/2018] phenytoin  200 mg Per Tube Daily  . phenytoin  200 mg Per Tube Q48H  . polyethylene glycol  17 g Oral Daily  . senna  1 tablet Per Tube BID  . sodium chloride flush  3 mL Intravenous Q12H  . [START ON 03/09/2018] thiamine  100 mg Oral Daily   Continuous Infusions: . sodium chloride Stopped (03/05/18 1108)  . sodium chloride Stopped (03/03/18 1106)  . ampicillin-sulbactam (UNASYN) IV 3 g (03/08/18 1519)  . feeding supplement (OSMOLITE 1.2 CAL) 1,000 mL (03/08/18 1042)  . methocarbamol (ROBAXIN) IV Stopped (03/05/18 2055)  PRN Meds:.acetaminophen **OR** acetaminophen, albuterol, bisacodyl, dextromethorphan-guaiFENesin, HYDROcodone-acetaminophen, HYDROmorphone (DILAUDID) injection, LORazepam, menthol-cetylpyridinium **OR** phenol, methocarbamol **OR** methocarbamol (ROBAXIN) IV, morphine injection, ondansetron **OR** ondansetron (ZOFRAN) IV, oxyCODONE,  senna-docusate, sodium chloride flush, sodium phosphate  DVT prophylaxis: Lovenox Code Status: Full code Family Communication: Mother present at bedside Disposition Plan: To be determined once feeding issues resolve  Consultants:   Neurosurgery  Procedures:   12/32 1. Discectomy at C5-6 for decompression of spinal cord and exiting nerve roots  2. Placement of intervertebral biomechanical device Medtronic PTC PEEK 13m lordotic cage 3. Placement of anterior instrumentation consisting of interbody plate and screws - Medtronic Zevo plate, 146mscrews  4. Use of morselized bone allograft  5. Arthrodesis C5-6, anterior interbody technique  6. Use of intraoperative microscope  Antimicrobials:  Unasyn 1/3 >>   Objective: Vitals:   03/08/18 0500 03/08/18 0821 03/08/18 1141 03/08/18 1558  BP:  118/68 127/72 130/76  Pulse:  84 79 75  Resp:  _0 Temp:  97.7 F (36.5 C) 97.6 F (36.4 C) 97.7 F (36.5 C)  TempSrc:  Oral Oral Oral  SpO2:  93% 96% 93%  Weight: 65.4 kg     Height:        Intake/Output Summary (Last 24 hours) at 03/08/2018 1837 Last data filed at 03/08/2018 1519 Gross per 24 hour  Intake 2537.59 ml  Output -  Net 2537.59 ml   Filed Weights   02/26/18 1204 02/27/18 0403 03/08/18 0500  Weight: 64.4 kg 65.9 kg 65.4 kg    Physical Exam  Patient is examined daily including today on 03/08/18  , exams remain the same as of yesterday except that has changed   Gen:- Awake Alert, nonverbal, uses sign language HEENT:- [place].AT, No sclera icterus Neck- Neck Collar.  Lungs-improving air movement, few scattered rhonchi CV- S1, S2 normal Abd-  +ve B.Sounds, Abd Soft, No tenderness,    Extremity/Skin:- No  edema, good pulses Psych-affect is appropriate, oriented x3, some cognitive deficits Neuro-no new focal deficits, no tremors   CBC: Recent Labs  Lab 03/04/18 0433  WBC 8.8  HGB 13.4  HCT 42.0  MCV 93.1  PLT 13099  Basic Metabolic Panel: Recent Labs  Lab  03/04/18 0433  NA 139  K 4.1  CL 102  CO2 29  GLUCOSE 107*  BUN 10  CREATININE 1.07  CALCIUM 8.6*   GFR: Estimated Creatinine Clearance: 73.9 mL/min (by C-G formula based on SCr of 1.07 mg/dL). Liver Function Tests:  No results for input(s): AST, ALT, ALKPHOS, BILITOT, PROT, ALBUMIN in the last 168 hours. No results for input(s): LIPASE, AMYLASE in the last 168 hours. No results for input(s): AMMONIA in the last 168 hours. Coagulation Profile: Recent Labs  Lab 03/04/18 0433  INR 1.13   Cardiac Enzymes: No results for input(s): CKTOTAL, CKMB, CKMBINDEX, TROPONINI in the last 168 hours. BNP (last 3 results) No results for input(s): PROBNP in the last 8760 hours. HbA1C: No results for input(s): HGBA1C in the last 72 hours. CBG: Recent Labs  Lab 03/07/18 2353 03/08/18 0439 03/08/18 0817 03/08/18 1139 03/08/18 1555  GLUCAP 112* 98 146* 141* 140*   Lipid Profile: No results for input(s): CHOL, HDL, LDLCALC, TRIG, CHOLHDL, LDLDIRECT in the last 72 hours. Thyroid Function Tests: No results for input(s): TSH, T4TOTAL, FREET4, T3FREE, THYROIDAB in the last 72 hours. Anemia Panel: No results for input(s): VITAMINB12, FOLATE, FERRITIN, TIBC, IRON, RETICCTPCT in the last 72 hours. Urine analysis:    Component Value Date/Time  COLORURINE STRAW (A) 02/26/2018 1330   APPEARANCEUR CLEAR 02/26/2018 1330   LABSPEC 1.003 (L) 02/26/2018 1330   PHURINE 6.0 02/26/2018 1330   GLUCOSEU NEGATIVE 02/26/2018 1330   HGBUR NEGATIVE 02/26/2018 1330   HGBUR negative 07/09/2007 1417   BILIRUBINUR NEGATIVE 02/26/2018 1330   BILIRUBINUR n 12/07/2014 1314   KETONESUR NEGATIVE 02/26/2018 1330   PROTEINUR NEGATIVE 02/26/2018 1330   UROBILINOGEN 0.2 12/07/2014 1314   UROBILINOGEN 1.0 07/09/2007 1417   NITRITE NEGATIVE 02/26/2018 1330   LEUKOCYTESUR NEGATIVE 02/26/2018 1330   Sepsis Labs: Invalid input(s): PROCALCITONIN, LACTICIDVEN  Recent Results (from the past 240 hour(s))  MRSA PCR  Screening     Status: None   Collection Time: 03/02/18  8:41 PM  Result Value Ref Range Status   MRSA by PCR NEGATIVE NEGATIVE Final    Comment:        The GeneXpert MRSA Assay (FDA approved for NASAL specimens only), is one component of a comprehensive MRSA colonization surveillance program. It is not intended to diagnose MRSA infection nor to guide or monitor treatment for MRSA infections. Performed at [facility], [place], [place] [address]      Roxan Hockey, MD  [facility]  If 7PM-7AM, please contact night-coverage [email or web address] Password [facility] 03/08/2018, 6:37 PM

## 2018-03-08 NOTE — Progress Notes (Signed)
Subjective: Patient reports doing OK  Objective: Vital signs in last 24 hours: Temp:  [97.5 F (36.4 C)-98.5 F (36.9 C)] 97.7 F (36.5 C) (01/05 0821) Pulse Rate:  [66-84] 84 (01/05 0821) Resp:  [16-20] 20 (01/05 0821) BP: (118-157)/(63-77) 118/68 (01/05 0821) SpO2:  [93 %-96 %] 93 % (01/05 0821) Weight:  [65.4 kg] 65.4 kg (01/05 0500)  Intake/Output from previous day: 01/04 0701 - 01/05 0700 In: 1498.4 [NG/GT:838.4; IV Piggyback:660] Out: -  Intake/Output this shift: No intake/output data recorded.  Physical Exam: Dysphagia persists.  Translator facilitated communication.  Patient currently without complaints.  Cervical dressing in place, some persistent swelling.    Lab Results: No results for input(s): WBC, HGB, HCT, PLT in the last 72 hours. BMET No results for input(s): NA, K, CL, CO2, GLUCOSE, BUN, CREATININE, CALCIUM in the last 72 hours.  Studies/Results: No results found.  Assessment/Plan: Continued npo.  Family looking forward to discharge, wondering if PEG tube will be necessary as after prior coma.    LOS: 9 days    Peggyann Shoals, MD 03/08/2018, 9:30 AM
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
## 2018-03-09 LAB — GLUCOSE, CAPILLARY
GLUCOSE-CAPILLARY: 112 mg/dL — AB (ref 70–99)
Glucose-Capillary: 116 mg/dL — ABNORMAL HIGH (ref 70–99)
Glucose-Capillary: 119 mg/dL — ABNORMAL HIGH (ref 70–99)
Glucose-Capillary: 160 mg/dL — ABNORMAL HIGH (ref 70–99)
Glucose-Capillary: 97 mg/dL (ref 70–99)

## 2018-03-09 LAB — BASIC METABOLIC PANEL
Anion gap: 6 (ref 5–15)
BUN: 13 mg/dL (ref 6–20)
CO2: 31 mmol/L (ref 22–32)
Calcium: 8.8 mg/dL — ABNORMAL LOW (ref 8.9–10.3)
Chloride: 105 mmol/L (ref 98–111)
Creatinine, Ser: 0.94 mg/dL (ref 0.61–1.24)
GFR calc Af Amer: >60 mL/min (ref >60)
GFR calc non Af Amer: >60 mL/min (ref >60)
Glucose, Bld: 125 mg/dL — ABNORMAL HIGH (ref 70–99)
Potassium: 4.5 mmol/L (ref 3.5–5.1)
SODIUM: 142 mmol/L (ref 135–145)

## 2018-03-09 LAB — CBC
HCT: 40.9 % (ref 39.0–52.0)
Hemoglobin: 12.8 g/dL — ABNORMAL LOW (ref 13.0–17.0)
MCH: 29.8 pg (ref 26.0–34.0)
MCHC: 31.3 g/dL (ref 30.0–36.0)
MCV: 95.1 fL (ref 80.0–100.0)
Platelets: 163 10*3/uL (ref 150–400)
RBC: 4.3 MIL/uL (ref 4.22–5.81)
RDW: 12.6 % (ref 11.5–15.5)
WBC: 4.8 10*3/uL (ref 4.0–10.5)
nRBC: 0 % (ref 0.0–0.2)

## 2018-03-09 MED ORDER — FAMOTIDINE 40 MG/5ML PO SUSR
20.0000 mg | Freq: Every day | ORAL | Status: DC
  Administered 2018-03-09: 20 mg
  Filled 2018-03-09: qty 2.5

## 2018-03-09 NOTE — Progress Notes (Signed)
Physical Therapy Treatment Patient Details Name: Damon [name]. MRN: [id number] DOB: 1965/07/26 Today's Date: 03/09/2018    History of Present Illness Pt is a 53 y/o male with medical history significant of TBI, L temporal lobe encephalomalacia, depression, deafness, seizure, MVP, GERD, who presents with left leg weakness and poor balance. MRI, CTA, EEG negative. Pt underwent ACDF 03/03/18.    PT Comments    Pt has made notable improvements.  General stability has improved and overall his gait pattern has improved, but not fully normalized.  With cane, pt is still too unsteady to not hold to or stay very close guard.  With the RW, pt is generally in more steady, but still goes too fast, and loses control of his speed.    Follow Up Recommendations  Outpatient PT     Equipment Recommendations  None recommended by PT    Recommendations for Other Services       Precautions / Restrictions Precautions Precautions: Fall;Cervical Precaution Booklet Issued: Yes (comment) Precaution Comments: reviewed with pt/pt's parents Required Braces or Orthoses: Cervical Brace Cervical Brace: Hard collar;Other (comment) Restrictions Weight Bearing Restrictions: No    Mobility  Bed Mobility               General bed mobility comments: OOB on arrival  Transfers Overall transfer level: Needs assistance Equipment used: Rolling walker (2 wheeled) Transfers: Sit to/from Stand Sit to Stand: Supervision         General transfer comment: still needs safety reinforcement  Ambulation/Gait Ambulation/Gait assistance: Min guard Gait Distance (Feet): 120 Feet(x2 with cane and 100 with RW) Assistive device: Rolling walker (2 wheeled);Straight cane Gait Pattern/deviations: Step-through pattern Gait velocity: moderate Gait velocity interpretation: 1.31 - 2.62 ft/sec, indicative of limited community ambulator(gets a little out of control if goes too fast.) General Gait Details: mildly  unsteady overall with wide BOS, but with cane, BOS is wider and there are a few more deviations and min for 1 recovery.   Stairs             Wheelchair Mobility    Modified Rankin (Stroke Patients Only)       Balance Overall balance assessment: Needs assistance   Sitting balance-Leahy Scale: Good       Standing balance-Leahy Scale: Fair                              Cognition Arousal/Alertness: Awake/alert Behavior During Therapy: WFL for tasks assessed/performed Overall Cognitive Status: History of cognitive impairments - at baseline                                        Exercises      General Comments General comments (skin integrity, edema, etc.): reinforced cervical precautions and addressed progression of activity      Pertinent Vitals/Pain Pain Assessment: Faces Faces Pain Scale: No hurt Pain Location: neck Pain Intervention(s): Monitored during session    Home Living                      Prior Function            PT Goals (current goals can now be found in the care plan section) Acute Rehab PT Goals Patient Stated Goal: hopefully home today or tomorrow PT Goal Formulation: With patient/family Time For Goal Achievement: 03/13/18 Potential  to Achieve Goals: Good Progress towards PT goals: Progressing toward goals    Frequency    Min 5X/week      PT Plan Current plan remains appropriate    Co-evaluation              AM-PAC PT "6 Clicks" Mobility   Outcome Measure  Help needed turning from your back to your side while in a flat bed without using bedrails?: None Help needed moving from lying on your back to sitting on the side of a flat bed without using bedrails?: None Help needed moving to and from a bed to a chair (including a wheelchair)?: A Little Help needed standing up from a chair using your arms (e.g., wheelchair or bedside chair)?: A Little Help needed to walk in hospital room?: A  Little Help needed climbing 3-5 steps with a railing? : A Little 6 Click Score: 20    End of Session   Activity Tolerance: Patient tolerated treatment well Patient left: in chair;with call bell/phone within reach;with family/visitor present Nurse Communication: Mobility status PT Visit Diagnosis: Unsteadiness on feet (R26.81);Difficulty in walking, not elsewhere classified (R26.2)     Time: 6568-1275 PT Time Calculation (min) (ACUTE ONLY): 13 min  Charges:  $Gait Training: 8-22 mins                     03/09/2018  Donnella Sham, PT Acute Rehabilitation Services [phone]  (pager) [phone]  (office)   Tessie Fass Adalis Gatti 03/09/2018, 5:41 PM
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
## 2018-03-09 NOTE — Progress Notes (Addendum)
  NEUROSURGERY PROGRESS NOTE   No issues overnight.  Remains NPO  EXAM:  BP (!) 130/59 (BP Location: Left Arm)   Pulse 75   Temp 98.3 F (36.8 C) (Oral)   Resp 18   Ht 5\' 10"  (1.778 m)   Wt 65.5 kg   SpO2 97%   BMI 20.72 kg/m   Awake, alert, oriented  Speech fluent, appropriate  CN grossly intact  MAEW with symmetric strength Incision: small palpable hematoma without active drainage.   IMPRESSION/PLAN 53 y.o. male s/p C5-6 ACDF due to cervical myelopathy. Had some issues with dysphagia post op requiring cortrak. Dr Joesph Fillers performed bedside swallow test this am and reports patient did well. Will have SLP re-eval today. I removed his collar for symptomatic relief. Otherwise, he looks good. Cleared from NS perspective for d/c once able to tolerate po. Continue decadron for now.

## 2018-03-09 NOTE — Progress Notes (Signed)
Occupational Therapy Treatment Patient Details Name: Damon [name]. MRN: [id number] DOB: [birth year] Today's Date: 03/09/2018    History of present illness Pt is a 53 y/o male with medical history significant of TBI, L temporal lobe encephalomalacia, depression, deafness, seizure, MVP, GERD, who presents with left leg weakness and poor balance. MRI, CTA, EEG negative. Pt underwent ACDF 03/03/18.   OT comments  Pt presents sitting up in recliner, motivated to work with therapy this session. Pt completing room and hallway level mobility using RW with overall minguard assist. Pt performing standing grooming ADL with minguard assist, with one slight LOB posteriorly while performing task without UE support requiring minA to correct. Pt continues to require intermittent cues to maintain cervical precautions during mobility and functional tasks. Pt's parents present and supportive throughout and also aware of cervical precautions, providing cues appropriately for pt during session as well. Feel POC remains appropriate at this time. Will continue to follow acutely.   Follow Up Recommendations  No OT follow up    Equipment Recommendations  None recommended by OT          Precautions / Restrictions Precautions Precautions: Fall;Cervical Precaution Booklet Issued: Yes (comment) Precaution Comments: reviewed with pt/pt's parents Required Braces or Orthoses: Cervical Brace Cervical Brace: Hard collar;Other (comment) Restrictions Weight Bearing Restrictions: No       Mobility Bed Mobility               General bed mobility comments: OOB on arrival  Transfers Overall transfer level: Needs assistance Equipment used: Rolling walker (2 wheeled) Transfers: Sit to/from Stand Sit to Stand: Min guard         General transfer comment: for safety; pt demonstrates safe hand placement    Balance Overall balance assessment: Needs assistance   Sitting balance-Leahy Scale: Good        Standing balance-Leahy Scale: Fair                             ADL either performed or assessed with clinical judgement   ADL Overall ADL's : Needs assistance/impaired     Grooming: Wash/dry hands;Min guard;Minimal assistance;Standing Grooming Details (indicate cue type and reason): overall minguard for safety however pt with one minor LOB requiring minA to steady                           Tub/Shower Transfer Details (indicate cue type and reason): verbally reviewed safe shower transfer techniques with pt and pt's mother Functional mobility during ADLs: Minimal assistance;Rolling walker General ADL Comments: pt and pt's mothers biggest goals are LE strengthening; completing standing grooming ADL and functional mobility in room/hallway as part of session     Vision       Perception     Praxis      Cognition Arousal/Alertness: Awake/alert Behavior During Therapy: WFL for tasks assessed/performed Overall Cognitive Status: History of cognitive impairments - at baseline                                          Exercises     Shoulder Instructions       General Comments      Pertinent Vitals/ Pain       Pain Assessment: Faces Faces Pain Scale: No hurt Pain Intervention(s): Monitored during session  Home  Living                                          Prior Functioning/Environment              Frequency  Min 2X/week        Progress Toward Goals  OT Goals(current goals can now be found in the care plan section)  Progress towards OT goals: Progressing toward goals  Acute Rehab OT Goals Patient Stated Goal: hopefully home today or tomorrow OT Goal Formulation: With patient Time For Goal Achievement: 03/13/18 Potential to Achieve Goals: Good  Plan Discharge plan remains appropriate    Co-evaluation                 AM-PAC OT "6 Clicks" Daily Activity     Outcome Measure   Help from  another person eating meals?: None Help from another person taking care of personal grooming?: A Little Help from another person toileting, which includes using toliet, bedpan, or urinal?: A Little Help from another person bathing (including washing, rinsing, drying)?: A Little Help from another person to put on and taking off regular upper body clothing?: None Help from another person to put on and taking off regular lower body clothing?: A Little 6 Click Score: 20    End of Session Equipment Utilized During Treatment: Gait belt;Rolling walker;Cervical collar  OT Visit Diagnosis: Other abnormalities of gait and mobility (R26.89);Muscle weakness (generalized) (M62.81)   Activity Tolerance Patient tolerated treatment well   Patient Left in chair;with call bell/phone within reach;with family/visitor present   Nurse Communication Mobility status        Time: 9024-0973 OT Time Calculation (min): 15 min  Charges: OT General Charges $OT Visit: 1 Visit OT Treatments $Therapeutic Activity: 8-22 mins  Lou Cal, OT Supplemental Rehabilitation Services Pager [phone] Office [phone]    Raymondo Band 03/09/2018, 4:21 PM
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
## 2018-03-09 NOTE — Progress Notes (Signed)
PROGRESS NOTE  Damon [name]. QJJ:[id number] DOB: January 19, 1966 DOA: 02/26/2018 PCP: [name], DO   LOS: 10 days   Brief Narrative / Interim history: 53 year old male with history of traumatic brain injury, seizures, deafness, depression, hypothyroidism, GERD and MDP presenting with left leg shaking and poor balance.  MRI of the cervical spine showed significant cord compression at the level of C5-C6.  Seen by neurosurgery with status post surgical intervention on 03/03/18.  Subjective: Mother and dad at bedside able to translate with sign language, tolerating tube feeding okay, did well when I did bedside swallow eval,   Assessment & Plan: Principal Problem:   TIA (transient ischemic attack) Active Problems:   Hypothyroidism   Seizure disorder ([place])   Hyperlipidemia   GERD (gastroesophageal reflux disease)   Communication disability- deaf -  needs extra time for communication   Depression   Left leg weakness   Spells of trembling   Spinal stenosis in cervical region   Cervical nerve root impingement   Leg weakness, bilateral   Hyperreflexia   Spastic   TBI (traumatic brain injury) ([place])   B12 deficiency   Malnutrition of moderate degree   Principal Problem: 1)Acute gait instability with C5-C6 cord compression -Underwent C5-6 discectomy for decompression of spinal cord-s/p C5-6 ACDF due to cervical myelopathy---- placement of intervertebral biomechanical deviceMedtronic PTC PEEK 58m lordotic cage. -Tolerated surgery well. PT eval. Further recs per neurosurgery. -Noted for low B12 (115).  Given subcu dose on 12/30. - postop   patient having difficulty swallowing, neck appears swollen.  No stridor or drooling from the mouth. -Patient was started on Decadron at neurosurgery recommendations, however it appears that this medication was discontinued on 1/2. resumed on 1/3 and continue management per neurosurgery -Patient repeatedly failed swallow eval, c/n feeding via  Cortrac  Active Problems: 2)Fever -Fever resolving,  cough   with copious mucus production, chest x-ray fairly unremarkable but high suspicion for aspiration and okay to continue Unasyn  3)FEN/moderate protein caloric malnutrition-.  Passed bedside swallow eval that I performed today,  official input from speech pathologist appreciated, recommended--- Diet recommendations: Regular;Thin liquid Liquids provided via: Cup;Straw Medication Administration: Crushed with puree Supervision: Patient able to self feed Use sliding scale insulin while on steroids/Decadron  4)Seizure disorder ([place] -EEG without seizure activity.  Continue Dilantin.  5)Hypothyroidism -Continue Synthroid.  TSH normal.  6)History of depression -Continue Prozac  7)Constipation -Continue senna, Colace and MiraLAX.  8)GERD -Continue PPI  9)TBI (traumatic brain injury) ([facility])----at baseline   Scheduled Meds: . aspirin  325 mg Per Tube Daily   Or  . aspirin  300 mg Rectal Daily  . dexamethasone  4 mg Intravenous Q12H  . diphenhydrAMINE  25 mg Intravenous QHS  . enoxaparin (LOVENOX) injection  40 mg Subcutaneous Q24H  . famotidine  20 mg Per Tube QHS  . FLUoxetine  20 mg Oral Daily  . gabapentin  300 mg Oral TID  . insulin aspart  0-5 Units Subcutaneous QHS  . insulin aspart  0-9 Units Subcutaneous TID WC  . levothyroxine  50 mcg Intravenous Daily  . pantoprazole sodium  20 mg Per Tube BID  . phenytoin  100 mg Oral Q48H  . phenytoin  200 mg Per Tube Daily  . phenytoin  200 mg Per Tube Q48H  . polyethylene glycol  17 g Oral Daily  . senna  1 tablet Per Tube BID  . sodium chloride flush  3 mL Intravenous Q12H  . thiamine  100 mg Oral  Daily   Continuous Infusions: . sodium chloride Stopped (03/05/18 1108)  . sodium chloride Stopped (03/03/18 1106)  . ampicillin-sulbactam (UNASYN) IV 3 g (03/09/18 1354)  . methocarbamol (ROBAXIN) IV Stopped (03/05/18 2055)   PRN Meds:.acetaminophen **OR**  acetaminophen, albuterol, bisacodyl, dextromethorphan-guaiFENesin, HYDROcodone-acetaminophen, HYDROmorphone (DILAUDID) injection, LORazepam, menthol-cetylpyridinium **OR** phenol, methocarbamol **OR** methocarbamol (ROBAXIN) IV, morphine injection, ondansetron **OR** ondansetron (ZOFRAN) IV, oxyCODONE, senna-docusate, sodium chloride flush, sodium phosphate  DVT prophylaxis: Lovenox Code Status: Full code Family Communication: Mother present at bedside Disposition Plan: To be determined once feeding issues resolve  Consultants:   Neurosurgery  Procedures:   12/32 1. Discectomy at C5-6 for decompression of spinal cord and exiting nerve roots  2. Placement of intervertebral biomechanical device Medtronic PTC PEEK 33m lordotic cage 3. Placement of anterior instrumentation consisting of interbody plate and screws - Medtronic Zevo plate, 153mscrews  4. Use of morselized bone allograft  5. Arthrodesis C5-6, anterior interbody technique  6. Use of intraoperative microscope  Antimicrobials:  Unasyn 1/3 >>   Objective: Vitals:   03/09/18 0409 03/09/18 0753 03/09/18 1150 03/09/18 1530  BP: 133/75 (!) 130/59 119/74 101/71  Pulse: 77 75 75 69  Resp: _0 Temp: 98.6 F (37 C) 98.3 F (36.8 C) 97.9 F (36.6 C) 98 F (36.7 C)  TempSrc: Oral Oral Oral Oral  SpO2: 97% 97% 93%   Weight: 65.5 kg     Height:        Intake/Output Summary (Last 24 hours) at 03/09/2018 1726 Last data filed at 03/09/2018 1354 Gross per 24 hour  Intake 3002.5 ml  Output -  Net 3002.5 ml   Filed Weights   02/27/18 0403 03/08/18 0500 03/09/18 0409  Weight: 65.9 kg 65.4 kg 65.5 kg    Physical Exam  Patient is examined daily including today on 03/09/18  , exams remain the same as of yesterday except that has changed   Gen:- Awake Alert, nonverbal, uses sign language HEENT:- [place].AT, No sclera icterus Neck- Neck Collar--post-op wound with dressing over it Nose- Cortrack tube  Lungs-improving air  movement, few scattered rhonchi CV- S1, S2 normal Abd-  +ve B.Sounds, Abd Soft, No tenderness,    Extremity/Skin:- No  edema, good pulses Psych-affect is appropriate, oriented x3, some cognitive deficits Neuro-no new focal deficits, no tremors   CBC: Recent Labs  Lab 03/04/18 0433 03/09/18 0521  WBC 8.8 4.8  HGB 13.4 12.8*  HCT 42.0 40.9  MCV 93.1 95.1  PLT 135* 16527 Basic Metabolic Panel: Recent Labs  Lab 03/04/18 0433 03/09/18 0521  NA 139 142  K 4.1 4.5  CL 102 105  CO2 29 31  GLUCOSE 107* 125*  BUN 10 13  CREATININE 1.07 0.94  CALCIUM 8.6* 8.8*   GFR: Estimated Creatinine Clearance: 84.2 mL/min (by C-G formula based on SCr of 0.94 mg/dL). Liver Function Tests:  No results for input(s): AST, ALT, ALKPHOS, BILITOT, PROT, ALBUMIN in the last 168 hours. No results for input(s): LIPASE, AMYLASE in the last 168 hours. No results for input(s): AMMONIA in the last 168 hours. Coagulation Profile: Recent Labs  Lab 03/04/18 0433  INR 1.13  CBG: Recent Labs  Lab 03/08/18 2323 03/09/18 0525 03/09/18 0751 03/09/18 1148 03/09/18 1657  GLUCAP 160* 112* 116* 119* 160*   Lipid Profile: No results for input(s): CHOL, HDL, LDLCALC, TRIG, CHOLHDL, LDLDIRECT in the last 72 hours. Thyroid Function Tests: No results for input(s): TSH, T4TOTAL, FREET4, T3FREE, THYROIDAB in the last 72 hours.  Anemia Panel: No results for input(s): VITAMINB12, FOLATE, FERRITIN, TIBC, IRON, RETICCTPCT in the last 72 hours. Urine analysis:    Component Value Date/Time   COLORURINE STRAW (A) 02/26/2018 1330   APPEARANCEUR CLEAR 02/26/2018 1330   LABSPEC 1.003 (L) 02/26/2018 1330   PHURINE 6.0 02/26/2018 1330   GLUCOSEU NEGATIVE 02/26/2018 1330   HGBUR NEGATIVE 02/26/2018 1330   HGBUR negative 07/09/2007 1417   BILIRUBINUR NEGATIVE 02/26/2018 1330   BILIRUBINUR n 12/07/2014 1314   KETONESUR NEGATIVE 02/26/2018 1330   PROTEINUR NEGATIVE 02/26/2018 1330   UROBILINOGEN 0.2 12/07/2014  1314   UROBILINOGEN 1.0 07/09/2007 1417   NITRITE NEGATIVE 02/26/2018 1330   LEUKOCYTESUR NEGATIVE 02/26/2018 1330   Sepsis Labs: Invalid input(s): PROCALCITONIN, LACTICIDVEN  Recent Results (from the past 240 hour(s))  MRSA PCR Screening     Status: None   Collection Time: 03/02/18  8:41 PM  Result Value Ref Range Status   MRSA by PCR NEGATIVE NEGATIVE Final    Comment:        The GeneXpert MRSA Assay (FDA approved for NASAL specimens only), is one component of a comprehensive MRSA colonization surveillance program. It is not intended to diagnose MRSA infection nor to guide or monitor treatment for MRSA infections. Performed at [facility], [address], [place] [address]      Roxan Hockey, MD  [facility]  If 7PM-7AM, please contact night-coverage [email or web address] Password [facility] 03/09/2018, 5:26 PM

## 2018-03-10 LAB — GLUCOSE, CAPILLARY
Glucose-Capillary: 107 mg/dL — ABNORMAL HIGH (ref 70–99)
Glucose-Capillary: 119 mg/dL — ABNORMAL HIGH (ref 70–99)

## 2018-03-10 MED ORDER — GABAPENTIN 300 MG PO CAPS: 300.0000 mg | ORAL_CAPSULE | Freq: Three times a day (TID) | ORAL | 1 refills | Status: DC

## 2018-03-10 MED ORDER — PREDNISONE 20 MG PO TABS: 40.0000 mg | ORAL_TABLET | Freq: Every day | ORAL | 0 refills | Status: DC

## 2018-03-10 MED ORDER — THIAMINE HCL 100 MG PO TABS: 100.0000 mg | ORAL_TABLET | Freq: Every day | ORAL | 2 refills | Status: DC

## 2018-03-10 MED ORDER — AMOXICILLIN-POT CLAVULANATE 875-125 MG PO TABS: 1.0000 | ORAL_TABLET | Freq: Two times a day (BID) | ORAL | 0 refills | Status: DC

## 2018-03-10 MED ORDER — METHOCARBAMOL 500 MG PO TABS: 500.0000 mg | ORAL_TABLET | Freq: Three times a day (TID) | ORAL | 1 refills | Status: DC

## 2018-03-10 MED ORDER — HYDROCODONE-ACETAMINOPHEN 5-325 MG PO TABS: 1.0000 | ORAL_TABLET | ORAL | 0 refills | Status: DC | PRN

## 2018-03-10 MED ORDER — SENNOSIDES-DOCUSATE SODIUM 8.6-50 MG PO TABS: 2.0000 | ORAL_TABLET | Freq: Every day | ORAL | 1 refills | Status: DC

## 2018-03-10 MED ORDER — ONDANSETRON HCL 4 MG PO TABS: 4.0000 mg | ORAL_TABLET | Freq: Four times a day (QID) | ORAL | 0 refills | Status: DC | PRN

## 2018-03-10 NOTE — Progress Notes (Signed)
Pt. Being discharged to home. IV has been removed. All belongings with patient. Pt. Leaving unit via wheelchair.

## 2018-03-10 NOTE — Progress Notes (Signed)
Patient tolerating po and will be discharged today. Will need medrol dose pack for d/c and f/u outpt in 3 weeks. Please call for any concerns.

## 2018-03-10 NOTE — Care Management Note (Signed)
Case Management Note  Patient Details  Name: Damon [name]. MRN: [id number] Date of Birth: 25-Aug-1965  Subjective/Objective:                    Action/Plan: Pt discharging home with his parents. Outpatient therapy recommended. Pt to f/u with Neurosgy prior to this being arranged.  PT recommending walker for ambulation. Mother unsure if have the 2 wheeled walker at home. CM provided orders for the DME in case they do not have at home.  Family to provide transportation and supervision at home.   Expected Discharge Date:  03/10/18               Expected Discharge Plan:  Home/Self Care  In-House Referral:     Discharge planning Services     Post Acute Care Choice:  Durable Medical Equipment Choice offered to:  Patient, Parent  DME Arranged:  [facility] Rile rolling(orders provided) DME Agency:     HH Arranged:    [place] Agency:     Status of Service:  Completed, signed off  If discussed at [employer] of Stay Meetings, dates discussed:    Additional Comments:  Pollie Friar, RN 03/10/2018, 10:25 AM
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
## 2018-03-12 ENCOUNTER — Ambulatory Visit (INDEPENDENT_AMBULATORY_CARE_PROVIDER_SITE_OTHER): Payer: Medicare HMO | Admitting: Family Medicine

## 2018-03-12 ENCOUNTER — Encounter: Payer: Self-pay | Admitting: Family Medicine

## 2018-03-12 VITALS — BP 123/83 | HR 89 | Temp 98.4°F | Ht 70.0 in | Wt 142.0 lb

## 2018-03-12 DIAGNOSIS — E538 Deficiency of other specified B group vitamins: Secondary | ICD-10-CM

## 2018-03-12 DIAGNOSIS — Z8673 Personal history of transient ischemic attack (TIA), and cerebral infarction without residual deficits: Secondary | ICD-10-CM

## 2018-03-12 DIAGNOSIS — R29898 Other symptoms and signs involving the musculoskeletal system: Secondary | ICD-10-CM

## 2018-03-12 DIAGNOSIS — Z09 Encounter for follow-up examination after completed treatment for conditions other than malignant neoplasm: Secondary | ICD-10-CM

## 2018-03-12 DIAGNOSIS — Z9889 Other specified postprocedural states: Secondary | ICD-10-CM | POA: Insufficient documentation

## 2018-03-12 MED ORDER — CYANOCOBALAMIN 2000 MCG PO TABS: 2000.0000 ug | ORAL_TABLET | Freq: Every day | ORAL | Status: DC

## 2018-03-12 NOTE — Patient Instructions (Signed)
Please follow-up with Dr. Kathyrn Sheriff  from [facility].  -   Call for an appt as there is a 66 day global charge and all f/up visits FOR THIS SURGERY/complaints related to the surgery etc. are included in that [place] charge. -Also please call their office and see if patient needs to wear the cervical neck brace or not.  -I would assume that the home rehab will work with Damon [name] twice weekly to improve his strength and left-sided weakness.  If you have any problems with this, please let me know.  However, this should have been ordered under the neurosurgeon as he is the one who did the surgery which caused the weakness etc.
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
## 2018-03-26 DIAGNOSIS — G959 Disease of spinal cord, unspecified: Secondary | ICD-10-CM | POA: Diagnosis not present

## 2018-03-26 DIAGNOSIS — M542 Cervicalgia: Secondary | ICD-10-CM | POA: Diagnosis not present

## 2018-04-08 DIAGNOSIS — J189 Pneumonia, unspecified organism: Secondary | ICD-10-CM | POA: Diagnosis not present

## 2018-04-09 ENCOUNTER — Other Ambulatory Visit: Payer: Self-pay | Admitting: Family Medicine

## 2018-04-09 DIAGNOSIS — E038 Other specified hypothyroidism: Secondary | ICD-10-CM

## 2018-04-10 ENCOUNTER — Encounter: Payer: Medicare HMO | Admitting: Family Medicine

## 2018-05-05 ENCOUNTER — Encounter: Payer: Medicare HMO | Admitting: Family Medicine

## 2018-05-07 ENCOUNTER — Other Ambulatory Visit: Payer: Self-pay | Admitting: Family Medicine

## 2018-05-07 DIAGNOSIS — F411 Generalized anxiety disorder: Secondary | ICD-10-CM

## 2018-05-11 ENCOUNTER — Ambulatory Visit: Payer: Medicare HMO | Admitting: Family Medicine

## 2018-05-12 ENCOUNTER — Encounter: Payer: Self-pay | Admitting: Family Medicine

## 2018-05-12 ENCOUNTER — Ambulatory Visit (INDEPENDENT_AMBULATORY_CARE_PROVIDER_SITE_OTHER): Payer: Medicare HMO | Admitting: Family Medicine

## 2018-05-12 VITALS — BP 121/74 | HR 82 | Temp 97.5°F | Ht 70.0 in | Wt 151.3 lb

## 2018-05-12 DIAGNOSIS — F411 Generalized anxiety disorder: Secondary | ICD-10-CM

## 2018-05-12 DIAGNOSIS — Z9889 Other specified postprocedural states: Secondary | ICD-10-CM

## 2018-05-12 DIAGNOSIS — M542 Cervicalgia: Secondary | ICD-10-CM | POA: Insufficient documentation

## 2018-05-12 DIAGNOSIS — M62838 Other muscle spasm: Secondary | ICD-10-CM | POA: Diagnosis not present

## 2018-05-12 DIAGNOSIS — R29898 Other symptoms and signs involving the musculoskeletal system: Secondary | ICD-10-CM | POA: Diagnosis not present

## 2018-05-12 MED ORDER — CYCLOBENZAPRINE HCL 10 MG PO TABS: 10.0000 mg | ORAL_TABLET | Freq: Three times a day (TID) | ORAL | 0 refills | Status: DC | PRN

## 2018-05-12 MED ORDER — DICLOFENAC SODIUM 1 % TD GEL: 4.0000 g | Freq: Four times a day (QID) | TRANSDERMAL | 1 refills | Status: DC

## 2018-05-12 NOTE — Progress Notes (Signed)
Impression and Recommendations:    1. Left leg weakness-new onset last OV ---->  NOW RESOLVED   2. Status post cervical discectomy-C5-C6.   3. Generalized anxiety disorder   4. Neck pain- acute on chronic    5. Muscle spasms of neck-    ( L upper back/ neck)   NEW ONSET     1. Neck Pain & Muscle Spasms, Acute on Chronic - Followed by Neurosurgery - Status post cervical discectomy, C5-C6. - Advised patient to allow neurosurgeon to handle patient's complaints about neck pain and pulling. - Patient knows to follow all advice and recommendations from neurosurgery.  - Advised patient to ask neurosurgeon how to break up the muscles to alleviate pain.  - Patient may apply heating pad to areas of muscle spasms. - Discussed that there are creams for prescription relief PRN. - Prescription cream provided today.  - Discussed use of muscle relaxer flexeril, at night only. - Advised patient to be careful with the flexeril to make sure he doesn't get too tired. - Flexeril provided today.  - Advised patient to stretch and relax the muscle as much as possible. - In addition, discussed prudent use of ice cup massage.  2. Left Leg Weakness - Now Resolved - Stable at this time. - Per patient, no concerns about leg today.  Symptoms completely resolved. - Patient knows to continue to follow up with neurology as established.  3 GAD - Stable at this time. - Continue management as prescribed. - Will continue to monitor.    No orders of the defined types were placed in this encounter.   Meds ordered this encounter  Medications  . cyclobenzaprine (FLEXERIL) 10 MG tablet    Sig: Take 1 tablet (10 mg total) by mouth 3 (three) times daily as needed for muscle spasms.    Dispense:  30 tablet    Refill:  0  . diclofenac sodium (VOLTAREN) 1 % GEL    Sig: Apply 4 g topically 4 (four) times daily. Prn Pain    Dispense:  100 g    Refill:  1    Medications Discontinued During This  Encounter  Medication Reason  . cyanocobalamin ([facility] VITAMIN B12) 2000 MCG tablet Patient Preference  . gabapentin (NEURONTIN) 300 MG capsule Patient Preference  . HYDROcodone-acetaminophen (NORCO/VICODIN) 5-325 MG tablet Completed Course  . methocarbamol (ROBAXIN) 500 MG tablet Completed Course  . ondansetron (ZOFRAN) 4 MG tablet Completed Course  . predniSONE (DELTASONE) 20 MG tablet Completed Course  . senna-docusate (SENOKOT-S) 8.6-50 MG tablet Completed Course     Gross side effects, risk and benefits, and alternatives of medications and treatment plan in general discussed with patient.  Patient is aware that all medications have potential side effects and we are unable to predict every side effect or drug-drug interaction that may occur.   Patient will call with any questions prior to using medication if they have concerns.    Expresses verbal understanding and consents to current therapy and treatment regimen.  No barriers to understanding were identified.  Red flag symptoms and signs discussed in detail.  Patient expressed understanding regarding what to do in case of emergency\urgent symptoms  Please see AVS handed out to patient at the end of our visit for further patient instructions/ counseling done pertaining to today's office visit.   Return for 2) early June- Full set FBW then ov with me 5 d later.     Note:  This note was prepared with  assistance of Systems analyst. Occasional wrong-word or sound-a-like substitutions may have occurred due to the inherent limitations of voice recognition software.   This document serves as a record of services personally performed by [name], DO. It was created on her behalf by Toni Amend, a trained medical scribe. The creation of this record is based on the scribe's personal observations and the provider's statements to them.   I have reviewed the above medical documentation for accuracy and completeness and  I concur.  [name], DO 05/12/2018 8:07 PM       -------------------------------------------------------------------------------------------------------------------------------------    Subjective:     HPI: Damon [name]. is a 53 y.o. male who presents to [facility] [facility] today for issues as discussed below.  Neck Pain & Tightness Per mom, patient has an appointment upcoming with neurosurgery.  Complained that his neck hurts every now and then playing the video games, and it pulls now and then.  Mom notes that "he's back to where he's complaining about it."  The left side of his neck continues to experience muscle spasms and tightness.  He cannot stretch the area very easily.  Lower Body Notes his body is feeling fine overall.  Confirms his leg is feeling fine.  Overall, patient denies concerns or complaints, and confirms that symptoms in his leg are completely resolved.    Wt Readings from Last 3 Encounters:  05/12/18 151 lb 4.8 oz (68.6 kg)  03/12/18 142 lb (64.4 kg)  03/10/18 146 lb 2.6 oz (66.3 kg)   BP Readings from Last 3 Encounters:  05/12/18 121/74  03/12/18 123/83  03/10/18 134/82   Pulse Readings from Last 3 Encounters:  05/12/18 82  03/12/18 89  03/10/18 68   BMI Readings from Last 3 Encounters:  05/12/18 21.71 kg/m  03/12/18 20.37 kg/m  03/10/18 20.97 kg/m     Patient Care Team    Relationship Specialty Notifications Start End  [name], DO PCP - General Family Medicine  02/16/18   Irene Shipper, MD Consulting Physician Gastroenterology  02/16/18   Consuella Lose, MD Consulting Physician Neurosurgery  03/12/18      Patient Active Problem List   Diagnosis Date Noted  . ? Hx of transient ischemic attack (TIA) 03/12/2018    Priority: High  . TBI (traumatic brain injury) [facility])     Priority: High  . Hyperlipidemia 04/11/2017    Priority: High  . Seizure disorder ([place]) 10/29/2006    Priority:  High  . B12 deficiency 03/02/2018    Priority: Medium  . GERD (gastroesophageal reflux disease) 01/13/2018    Priority: Medium  . Cough variant asthma 04/26/2016    Priority: Medium  . Generalized anxiety disorder 01/17/2016    Priority: Medium  . Hypothyroidism 07/09/2007    Priority: Medium  . Chronic cough 09/29/2017    Priority: Low  . Neck pain- acute on chronic  05/12/2018  . Muscle spasms of neck-    ( L upper back/ neck)   NEW ONSET 05/12/2018  . Status post cervical discectomy 03/12/2018  . Malnutrition of moderate degree 03/08/2018  . Spells of trembling   . Spinal stenosis in cervical region   . Cervical nerve root impingement   . Leg weakness, bilateral   . Hyperreflexia   . Spastic   . Depression 02/26/2018  . Left leg weakness 02/26/2018  . Dysphagia 02/16/2018  . Status post dilation of esophageal narrowing 02/16/2018  . Gastroesophageal reflux disease  02/16/2018  . Enlargement of R thyroid gland apprec in 2017 by prior provider as well.  02/16/2018  . Communication disability- deaf -  needs extra time for communication 02/16/2018  . Heart palpitations 10/13/2017  . Upper airway cough syndrome 05/13/2017  . Allergic rhinitis 03/15/2016  . Vitamin D deficiency 07/19/2015  . Hypocalcemia 07/04/2015  . Family history of colonic polyps 12/07/2014  . Onychomycosis of toenail 01/04/2013    Past Medical history, Surgical history, Family history, Social history, Allergies and Medications have been entered into the medical record, reviewed and changed as needed.    Current Meds  Medication Sig  . acetaminophen (TYLENOL) 500 MG tablet Take 500 mg by mouth every 6 (six) hours as needed for mild pain.  Marland Kitchen albuterol (PROVENTIL HFA;VENTOLIN HFA) 108 (90 Base) MCG/ACT inhaler Inhale 2 puffs into the lungs every 8 (eight) hours as needed for wheezing or shortness of breath.  . Cholecalciferol ([facility] VIT D 5000 HIGH-POTENCY PO) Take 1 tablet by mouth daily.   Marland Kitchen FLUoxetine  (PROZAC) 20 MG capsule Take 1 capsule (20 mg total) by mouth daily.  . pantoprazole (PROTONIX) 20 MG tablet Take 1 tablet (20 mg total) by mouth 2 (two) times daily before a meal.  . phenytoin (DILANTIN) 100 MG ER capsule 2 tabs twice daily (Patient taking differently: Take 100-200 mg by mouth See admin instructions. Takes 200mg  in the am and rotates every other night taking 100-200mg )  . SYNTHROID 75 MCG tablet TAKE 1 TABLET BY MOUTH ONCE DAILY  . thiamine 100 MG tablet Take 1 tablet (100 mg total) by mouth daily.    Allergies:  Allergies  Allergen Reactions  . Bactrim [Sulfamethoxazole-Trimethoprim] Rash     Review of Systems:  A fourteen system review of systems was performed and found to be positive as per HPI.   Objective:   Blood pressure 121/74, pulse 82, temperature (!) 97.5 F (36.4 C), height 5\' 10"  (1.778 m), weight 151 lb 4.8 oz (68.6 kg), SpO2 97 %. Body mass index is 21.71 kg/m. General:  Well Developed, well nourished, appropriate for stated age.  Neuro:  Alert and oriented,  extra-ocular muscles intact  HEENT:  Normocephalic, atraumatic, neck supple, no carotid bruits appreciated  Neck:   Supraclavicular region on the left with tightness of muscles and spasms from base of skull to humeral head, as well as down in the paravertebral region, with multiple trigger points. Skin:  no gross rash, warm, pink. Cardiac:  RRR, S1 S2 Respiratory:  ECTA B/L and A/P, Not using accessory muscles, speaking in full sentences- unlabored. Vascular:  Ext warm, no cyanosis apprec.; cap RF less 2 sec. Psych:  No HI/SI, judgement and insight good, Euthymic mood. Full Affect.

## 2018-05-12 NOTE — Patient Instructions (Signed)
Utilize ice cup massage to help break up areas of muscle spasm in the neck.  When not massaging the area with ice, utilize heating pad for relief over the area of muscle spasms.  Avoid rubbing the area excessively.  Utilize Flexeril at night PRN.   Avoid using Flexeril during the day, to be careful the patient doesn't get too tired or unstable on his feet.

## 2018-05-22 ENCOUNTER — Other Ambulatory Visit: Payer: Self-pay

## 2018-05-22 DIAGNOSIS — F411 Generalized anxiety disorder: Secondary | ICD-10-CM

## 2018-05-22 DIAGNOSIS — G40909 Epilepsy, unspecified, not intractable, without status epilepticus: Secondary | ICD-10-CM

## 2018-05-22 NOTE — Telephone Encounter (Signed)
Pharmacy sent refill request for medications that were last filled by a previous provider.  Please review and advise. LOV 05/12/18. MPulliam, CMA/RT(R)

## 2018-05-26 MED ORDER — FLUOXETINE HCL 20 MG PO CAPS: 20.0000 mg | ORAL_CAPSULE | Freq: Every day | ORAL | 1 refills | Status: DC

## 2018-05-26 NOTE — Telephone Encounter (Signed)
He will need to get Dilantin refill from his neurologist or neurosurgeon- whom he has one!   Please update the chart to the name of his neurologist that he sees is applicable.    Okay to refill Prozac as written

## 2018-05-27 ENCOUNTER — Telehealth: Payer: Self-pay | Admitting: Family Medicine

## 2018-05-27 NOTE — Telephone Encounter (Signed)
Patient will need to come in and get a Dilantin level drawn in the very near future so we can see what it currently  is.   Once Dilantin level is drawn and resulted, then okay to give 90 days of medicine with no refill  -Therapeutic levels are between 10 and 20 mcg/L or the free Dilantin levels 1 to 2 mcg/L of the free.  This should be done just prior to his next dose.  Thus, if he takes his dose in the morning then he would need to get blood work just prior to it or evening etc. or as close as can be.

## 2018-05-27 NOTE — Telephone Encounter (Signed)
Called and spoke to the patient's mother, please see note in the chart for refill request. MPulliam, CMA/RT(R)

## 2018-05-27 NOTE — Telephone Encounter (Signed)
Spoke to the patient's mother and she states that the patient has been on the Dilantin for 20 + years post head injury and the medication had been managed by pervious PCP. I reviewed the chart and it shows that medication was being filled by Dr. Martinique his former PCP.   Patient has only recently seen neurosurgeon for surgery on his neck.  Please review and advise. MPulliam, CMA/RT(R)

## 2018-05-27 NOTE — Telephone Encounter (Signed)
Patient wife stopped by office to request refill on :   phenytoin (DILANTIN) 100 MG ER capsule [[id number]]   Order Details  Dose, Route, Frequency: As Directed   Indications of Use: Seizure  Dispense Quantity: 360 capsule Refills: 3 Fills remaining: --        Sig: 2 tabs twice daily  Patient taking differently: Take 100-200 mg by mouth See admin instructions. Takes 200mg  in the am and rotates every other night taking 100-200mg           ---Forwarding request to medical assistant to send refill order to:   [id number] [address], [place] [phone] (Phone) [phone] (Fax   --glh

## 2018-05-29 ENCOUNTER — Other Ambulatory Visit: Payer: Self-pay

## 2018-05-29 ENCOUNTER — Other Ambulatory Visit: Payer: Medicare HMO

## 2018-05-29 DIAGNOSIS — S069X9D Unspecified intracranial injury with loss of consciousness of unspecified duration, subsequent encounter: Secondary | ICD-10-CM

## 2018-05-30 LAB — PHENYTOIN LEVEL, TOTAL: Phenytoin (Dilantin), Serum: 23.1 ug/mL (ref 10.0–20.0)

## 2018-06-01 NOTE — Telephone Encounter (Signed)
Patient came in Friday for labs. MPulliam, CMA/RT(R)

## 2018-06-02 ENCOUNTER — Other Ambulatory Visit: Payer: Self-pay

## 2018-06-02 DIAGNOSIS — G40909 Epilepsy, unspecified, not intractable, without status epilepticus: Secondary | ICD-10-CM

## 2018-06-02 MED ORDER — PHENYTOIN SODIUM EXTENDED 100 MG PO CAPS: 100.0000 mg | ORAL_CAPSULE | ORAL | 0 refills | Status: DC

## 2018-06-02 NOTE — Telephone Encounter (Signed)
Dr. Hershal Coria note on 05/22/2018: Patient will need to come in and get a Dilantin level drawn in the very near future so we can see what it currently  is.   Once Dilantin level is drawn and resulted, then okay to give 90 days of medicine with no refill  -Therapeutic levels are between 10 and 20 mcg/L or the free Dilantin levels 1 to 2 mcg/L of the free.  This should be done just prior to his next dose.  Thus, if he takes his dose in the morning then he would need to get blood work just prior to it or evening etc. or as close as can be.   Dr. Hershal Coria result note 06/02/2018 Have pt cont current dose dilantin- HE WILL NEED RECK IN 3 MO since this high N   Sent in RX for patient based on above notes.  [name] notified. MPulliam, CMA/RT(R)

## 2018-06-03 ENCOUNTER — Other Ambulatory Visit: Payer: Self-pay

## 2018-06-03 DIAGNOSIS — R1310 Dysphagia, unspecified: Secondary | ICD-10-CM

## 2018-06-03 DIAGNOSIS — K219 Gastro-esophageal reflux disease without esophagitis: Secondary | ICD-10-CM

## 2018-06-03 MED ORDER — FAMOTIDINE 20 MG PO TABS: 20.0000 mg | ORAL_TABLET | Freq: Every day | ORAL | 1 refills | Status: DC

## 2018-06-03 NOTE — Telephone Encounter (Signed)
Pharmacy sent over refill request for Famotidine.  Medication was last filled by a previous provider.  LOV 05/12/2018.  Please review and advise. MPulliam, CMA/RT(R)

## 2018-06-16 ENCOUNTER — Encounter: Payer: Medicare HMO | Admitting: Family Medicine

## 2018-07-29 ENCOUNTER — Other Ambulatory Visit: Payer: Self-pay

## 2018-07-29 DIAGNOSIS — E039 Hypothyroidism, unspecified: Secondary | ICD-10-CM

## 2018-07-29 DIAGNOSIS — Z0000 Encounter for general adult medical examination without abnormal findings: Secondary | ICD-10-CM

## 2018-07-29 DIAGNOSIS — E785 Hyperlipidemia, unspecified: Secondary | ICD-10-CM

## 2018-07-29 DIAGNOSIS — E559 Vitamin D deficiency, unspecified: Secondary | ICD-10-CM

## 2018-07-29 NOTE — Progress Notes (Signed)
Cbc

## 2018-07-30 ENCOUNTER — Other Ambulatory Visit: Payer: Self-pay

## 2018-07-30 ENCOUNTER — Other Ambulatory Visit (INDEPENDENT_AMBULATORY_CARE_PROVIDER_SITE_OTHER): Payer: Medicare HMO

## 2018-07-30 DIAGNOSIS — E559 Vitamin D deficiency, unspecified: Secondary | ICD-10-CM | POA: Diagnosis not present

## 2018-07-30 DIAGNOSIS — Z0000 Encounter for general adult medical examination without abnormal findings: Secondary | ICD-10-CM | POA: Diagnosis not present

## 2018-07-30 DIAGNOSIS — E785 Hyperlipidemia, unspecified: Secondary | ICD-10-CM | POA: Diagnosis not present

## 2018-07-30 DIAGNOSIS — R7309 Other abnormal glucose: Secondary | ICD-10-CM | POA: Diagnosis not present

## 2018-07-30 DIAGNOSIS — E039 Hypothyroidism, unspecified: Secondary | ICD-10-CM

## 2018-07-31 ENCOUNTER — Other Ambulatory Visit: Payer: Medicare HMO

## 2018-07-31 LAB — COMPREHENSIVE METABOLIC PANEL
ALT: 14 IU/L (ref 0–44)
AST: 17 IU/L (ref 0–40)
Albumin/Globulin Ratio: 1.8 (ref 1.2–2.2)
Albumin: 4.2 g/dL (ref 3.8–4.9)
Alkaline Phosphatase: 95 IU/L (ref 39–117)
BUN/Creatinine Ratio: 9 (ref 9–20)
BUN: 9 mg/dL (ref 6–24)
Bilirubin Total: <0.2 mg/dL (ref 0.0–1.2)
CO2: 26 mmol/L (ref 20–29)
Calcium: 9 mg/dL (ref 8.7–10.2)
Chloride: 99 mmol/L (ref 96–106)
Creatinine, Ser: 1.01 mg/dL (ref 0.76–1.27)
GFR calc Af Amer: 98 mL/min/{1.73_m2} (ref >59)
GFR calc non Af Amer: 85 mL/min/{1.73_m2} (ref >59)
Globulin, Total: 2.3 g/dL (ref 1.5–4.5)
Glucose: 72 mg/dL (ref 65–99)
Potassium: 4.3 mmol/L (ref 3.5–5.2)
Sodium: 140 mmol/L (ref 134–144)
Total Protein: 6.5 g/dL (ref 6.0–8.5)

## 2018-07-31 LAB — CBC WITH DIFFERENTIAL/PLATELET
Basophils Absolute: 0 10*3/uL (ref 0.0–0.2)
Basos: 1 %
EOS (ABSOLUTE): 0.1 10*3/uL (ref 0.0–0.4)
Eos: 1 %
Hematocrit: 43.4 % (ref 37.5–51.0)
Hemoglobin: 14.6 g/dL (ref 13.0–17.7)
Immature Grans (Abs): 0 10*3/uL (ref 0.0–0.1)
Immature Granulocytes: 0 %
Lymphocytes Absolute: 1 10*3/uL (ref 0.7–3.1)
Lymphs: 22 %
MCH: 30.9 pg (ref 26.6–33.0)
MCHC: 33.6 g/dL (ref 31.5–35.7)
MCV: 92 fL (ref 79–97)
Monocytes Absolute: 0.3 10*3/uL (ref 0.1–0.9)
Monocytes: 7 %
Neutrophils Absolute: 3.2 10*3/uL (ref 1.4–7.0)
Neutrophils: 69 %
Platelets: 209 10*3/uL (ref 150–450)
RBC: 4.72 x10E6/uL (ref 4.14–5.80)
RDW: 12.9 % (ref 11.6–15.4)
WBC: 4.6 10*3/uL (ref 3.4–10.8)

## 2018-07-31 LAB — VITAMIN D 25 HYDROXY (VIT D DEFICIENCY, FRACTURES): Vit D, 25-Hydroxy: 32.2 ng/mL (ref 30.0–100.0)

## 2018-07-31 LAB — HEMOGLOBIN A1C
Est. average glucose Bld gHb Est-mCnc: 94 mg/dL
Hgb A1c MFr Bld: 4.9 % (ref 4.8–5.6)

## 2018-07-31 LAB — LIPID PANEL
Chol/HDL Ratio: 5.4 ratio — ABNORMAL HIGH (ref 0.0–5.0)
Cholesterol, Total: 234 mg/dL — ABNORMAL HIGH (ref 100–199)
HDL: 43 mg/dL (ref >39)
LDL Calculated: 168 mg/dL — ABNORMAL HIGH (ref 0–99)
Triglycerides: 114 mg/dL (ref 0–149)
VLDL Cholesterol Cal: 23 mg/dL (ref 5–40)

## 2018-07-31 LAB — T3: T3, Total: 117 ng/dL (ref 71–180)

## 2018-07-31 LAB — TSH: TSH: 4.42 u[IU]/mL (ref 0.450–4.500)

## 2018-07-31 LAB — T4, FREE: Free T4: 0.85 ng/dL (ref 0.82–1.77)

## 2018-08-04 ENCOUNTER — Other Ambulatory Visit: Payer: Self-pay

## 2018-08-04 ENCOUNTER — Ambulatory Visit (INDEPENDENT_AMBULATORY_CARE_PROVIDER_SITE_OTHER): Payer: Medicare HMO | Admitting: Family Medicine

## 2018-08-04 ENCOUNTER — Encounter: Payer: Self-pay | Admitting: Family Medicine

## 2018-08-04 VITALS — BP 135/84 | HR 68 | Temp 98.4°F | Ht 70.0 in | Wt 142.3 lb

## 2018-08-04 DIAGNOSIS — K219 Gastro-esophageal reflux disease without esophagitis: Secondary | ICD-10-CM

## 2018-08-04 DIAGNOSIS — F411 Generalized anxiety disorder: Secondary | ICD-10-CM | POA: Diagnosis not present

## 2018-08-04 DIAGNOSIS — E039 Hypothyroidism, unspecified: Secondary | ICD-10-CM | POA: Diagnosis not present

## 2018-08-04 DIAGNOSIS — E785 Hyperlipidemia, unspecified: Secondary | ICD-10-CM

## 2018-08-04 DIAGNOSIS — S069X9D Unspecified intracranial injury with loss of consciousness of unspecified duration, subsequent encounter: Secondary | ICD-10-CM

## 2018-08-04 DIAGNOSIS — E559 Vitamin D deficiency, unspecified: Secondary | ICD-10-CM

## 2018-08-04 MED ORDER — LEVOTHYROXINE SODIUM 25 MCG PO TABS: ORAL_TABLET | ORAL | 1 refills | Status: DC

## 2018-08-04 MED ORDER — PANTOPRAZOLE SODIUM 20 MG PO TBEC: 20.0000 mg | DELAYED_RELEASE_TABLET | Freq: Every day | ORAL | 1 refills | Status: DC

## 2018-08-04 MED ORDER — ROSUVASTATIN CALCIUM 5 MG PO TABS: 5.0000 mg | ORAL_TABLET | Freq: Every day | ORAL | 0 refills | Status: DC

## 2018-08-04 MED ORDER — VITAMIN D (ERGOCALCIFEROL) 1.25 MG (50000 UNIT) PO CAPS: ORAL_CAPSULE | ORAL | 3 refills | Status: DC

## 2018-08-04 NOTE — Patient Instructions (Signed)
Guidelines for a Low Cholesterol, Low Saturated Fat Diet ° °Fats °- Limit total intake of fats and oils. °- Avoid butter, stick margarine, shortening, lard, palm and coconut oils. °- Limit mayonnaise, salad dressings, gravies and sauces, unless they are homemade with low-fat ingredients. °- Limit chocolate. °- Choose low-fat and nonfat products, such as low-fat mayonnaise, low-fat or non-hydrogenated peanut butter, low-fat or fat-free salad dressings and nonfat gravy. °- Use vegetable oil, such as canola or olive oil. °- Look for margarine that does not contain trans fatty acids. °- Use nuts in moderate amounts. °- Read ingredient labels carefully to determine both amount and type of fat present in foods. Limit saturated and trans fats! °- Avoid high-fat processed and convenience foods. ° °Meats and Meat Alternatives °- Choose fish, chicken, turkey and lean meats. °- Use dried beans, peas, lentils and tofu. °- Limit egg yolks to three to four per week. °- If you eat red meat, limit to no more than three servings per week and choose loin or round cuts. °- Avoid fatty meats, such as bacon, sausage, franks, luncheon meats and ribs. °- Avoid all organ meats, including liver. ° °Dairy °- Choose nonfat or low-fat milk, yogurt and cottage cheese. °- Most cheeses are high in fat. Choose cheeses made from non-fat milk, such as mozzarella and ricotta cheese. °- Choose light or fat-free cream cheese and sour cream. °- Avoid cream and sauces made with cream. ° °Fruits and Vegetables °- Eat a wide variety of fruits and vegetables. °- Use lemon juice, vinegar or "mist" olive oil on vegetables. °- Avoid adding sauces, fat or oil to vegetables. ° °Breads, Cereals and Grains °- Choose whole-grain breads, cereals, pastas and rice. °- Avoid high-fat snack foods, such as granola, cookies, pies, pastries, doughnuts and croissants. ° °Cooking Tips °- Avoid deep fried foods. °- Trim visible fat off meats and remove skin from poultry  before cooking. °- Bake, broil, boil, poach or roast poultry, fish and lean meats. °- Drain and discard fat that drains out of meat as you cook it. °- Add little or no fat to foods. °- Use vegetable oil sprays to grease pans for cooking or baking. °- Steam vegetables. °- Use herbs or no-oil marinades to flavor foods. °Nine ways to increase your "good" HDL cholesterol ° °High-density lipoprotein (HDL) is often referred to as the "good" cholesterol. °Having high HDL levels helps carry cholesterol from your arteries to your liver, where it can be used or excreted. ° °Having high levels of HDL also has antioxidant and anti-inflammatory effects, and is linked to a reduced risk of heart disease (1, 2). ° °Most health experts recommend minimum blood levels of 40 mg/dl in men and 50 mg/dl in women. ° °While genetics definitely play a role, there are several other factors that affect HDL levels. ° °Here are nine healthy ways to raise your "good" HDL cholesterol. ° °1. Consume olive oil ° °two pieces of salmon on a plate °olive oil being poured into a small dish °Extra virgin olive oil may be more healthful than processed olive oils. °Olive oil is one of the healthiest fats around. ° °A large analysis of 42 studies with more than 800,000 participants found that olive oil was the only source of monounsaturated fat that seemed to reduce heart disease risk (3). ° °Research has shown that one of olive oil's heart-healthy effects is an increase in HDL cholesterol. This effect is thought to be caused by antioxidants it contains called polyphenols (  4, 5, 6, 7). ° °Extra virgin olive oil has more polyphenols than more processed olive oils, although the amount can still vary among different types and brands. ° °One study gave 200 healthy young men about 2 tablespoons (25 ml) of different olive oils per day for three weeks. ° °The researchers found that participants' HDL levels increased significantly more after they consumed the olive  oil with the highest polyphenol content (6). ° °In another study, when 62 older adults consumed about 4 tablespoons (50 ml) of high-polyphenol extra virgin olive oil every day for six weeks, their HDL cholesterol increased by 6.5 mg/dl, on average (7). ° °In addition to raising HDL levels, olive oil has been found to boost HDL's anti-inflammatory and antioxidant function in studies of older people and individuals with high cholesterol levels ( 7, 8, 9). ° °Whenever possible, select high-quality, certified extra virgin olive oils, which tend to be highest in polyphenols. ° °Bottom line: Extra virgin olive oil with a high polyphenol content has been shown to increase HDL levels in healthy people, the elderly and individuals with high cholesterol. ° °2. Follow a low-carb or ketogenic diet ° °Low-carb and ketogenic diets provide a number of health benefits, including weight loss and reduced blood sugar levels. ° °They have also been shown to increase HDL cholesterol in people who tend to have lower levels. ° °This includes those who are obese, insulin resistant or diabetic (10, 11, 12, 13, 14, 15, 16, 17). ° °In one study, people with type 2 diabetes were split into two groups. ° °One followed a diet consuming less than 50 grams of carbs per day. The other followed a high-carb diet. ° °Although both groups lost weight, the low-carb group's HDL cholesterol increased almost twice as much as the high-carb group's did (14). ° °In another study, obese people who followed a low-carb diet experienced an increase in HDL cholesterol of 5 mg/dl overall. ° °Meanwhile, in the same study, the participants who ate a low-fat, high-carb diet showed a decrease in HDL cholesterol (15). ° °This response may partially be due to the higher levels of fat people typically consume on low-carb diets. ° °One study in overweight women found that diets high in meat and cheese increased HDL levels by 5-8%, compared to a higher-carb diet  (18). ° °What's more, in addition to raising HDL cholesterol, very-low-carb diets have been shown to decrease triglycerides and improve several other risk factors for heart disease (13, 14, 16, 17). ° °Bottom line: Low-carb and ketogenic diets typically increase HDL cholesterol levels in people with diabetes, metabolic syndrome and obesity. ° °3. Exercise regularly ° °Being physically active is important for heart health. ° °Studies have shown that many different types of exercise are effective at raising HDL cholesterol, including strength training, high-intensity exercise and aerobic exercise (19, 20, 21, 22, 23, 24). ° °However, the biggest increases in HDL are typically seen with high-intensity exercise. ° °One small study followed women who were living with polycystic ovary syndrome (PCOS), which is linked to a higher risk of insulin resistance. The study required them to perform high-intensity exercise three times a week. ° °The exercise led to an increase in HDL cholesterol of 8 mg/dL after 10 weeks. The women also showed improvements in other health markers, including decreased insulin resistance and improved arterial function (23). ° °In a 12-week study, overweight men who performed high-intensity exercise experienced a 10% increase in HDL cholesterol. ° °In contrast, the low-intensity exercise group showed only a   2% increase and the endurance training group experienced no change (24). ° °However, even lower-intensity exercise seems to increase HDL's anti-inflammatory and antioxidant capacities, whether or not HDL levels change (20, 21, 25). ° °Overall, high-intensity exercise such as high-intensity interval training (HIIT) and high-intensity circuit training (HICT) may boost HDL cholesterol levels the most. ° °Bottom line: Exercising several times per week can help raise HDL cholesterol and enhance its anti-inflammatory and antioxidant effects. High-intensity forms of exercise may be especially  effective. ° °4. Add coconut oil to your diet ° °Studies have shown that coconut oil may reduce appetite, increase metabolic rate and help protect brain health, among other benefits. ° °Some people may be concerned about coconut oil's effects on heart health due to its high saturated fat content. ° °However, it appears that coconut oil is actually quite heart healthy. ° °Coconut oil tends to raise HDL cholesterol more than many other types of fat. ° °In addition, it may improve the ratio of low-density-lipoprotein (LDL) cholesterol, the "bad" cholesterol, to HDL cholesterol. Improving this ratio reduces heart disease risk (26, 27, 28, 29). ° °One study examined the health effects of coconut oil on 40 women with excess belly fat. The researchers found that participants who took coconut oil daily experienced increased HDL cholesterol and a lower LDL-to-HDL ratio. ° °In contrast, the group who took soybean oil daily had a decrease in HDL cholesterol and an increase in the LDL-to-HDL ratio (29). ° °Most studies have found these health benefits occur at a dosage of about 2 tablespoons (30 ml) of coconut oil per day. It's best to incorporate this into cooking rather than eating spoonfuls of coconut oil on their own. ° °Bottom line: Consuming 2 tablespoons (30 ml) of coconut oil per day may help increase HDL cholesterol levels. ° °5. Stop smoking ° °cigarette butt °Quitting smoking can reduce the risk of heart disease and lung cancer. °Smoking increases the risk of many health problems, including heart disease and lung cancer (30). ° °One of its negative effects is a suppression of HDL cholesterol. ° °Some studies have found that quitting smoking can increase HDL levels. Indeed, one study found no significant differences in HDL levels between former smokers and people who had never smoked (31, 32, 33, 34, 35). ° °In a one-year study of more than 1,500 people, those who quit smoking had twice the increase in HDL as those  who resumed smoking within the year. The number of large HDL particles also increased, which further reduced heart disease risk (32). ° °One study followed smokers who switched from traditional cigarettes to electronic cigarettes for one year. They found that the switch was associated with an increase in HDL cholesterol of 5 mg/dl, on average (33). ° °When it comes to the effect of nicotine replacement patches on HDL levels, research results have been mixed. ° °One study found that nicotine replacement therapy led to higher HDL cholesterol. However, other research suggests that people who use nicotine patches likely won't see increases in HDL levels until after replacement therapy is completed (34, 36). ° °Even in studies where HDL cholesterol levels didn't increase after people quit smoking, HDL function improved, resulting in less inflammation and other beneficial effects on heart health (37). ° °Bottom line: Quitting smoking can increase HDL levels, improve HDL function and help protect heart health. ° °6. Lose weight ° °When overweight and obese people lose weight, their HDL cholesterol levels usually increase. ° °What's more, this benefit seems to occur whether weight   loss is achieved by calorie counting, carb restriction, intermittent fasting, weight loss surgery or a combination of diet and exercise (16, 38, 39, 40, 41, 42). ° °One study examined HDL levels in more than 3,000 overweight and obese [place] adults who followed a lifestyle modification program for one year. ° °The researchers found that losing at least 6.6 lbs (3 kg) led to an increase in HDL cholesterol of 4 mg/dl, on average (41). ° °In another study, when obese people with type 2 diabetes consumed calorie-restricted diets that provided 20-30% of calories from protein, they experienced significant increases in HDL cholesterol levels (42). ° °The key to achieving and maintaining healthy HDL cholesterol levels is choosing the type of diet that  makes it easiest for you to lose weight and keep it off. ° °Bottom Line: Several methods of weight loss have been shown to increase HDL cholesterol levels in people who are overweight or obese. ° °7. Choose purple produce ° °Consuming purple-colored fruits and vegetables is a delicious way to potentially increase HDL cholesterol. ° °Purple produce contains antioxidants known as anthocyanins. ° °Studies using anthocyanin extracts have shown that they help fight inflammation, protect your cells from damaging free radicals and may also raise HDL cholesterol levels (43, 44, 45, 46). ° °In a 24-week study of 58 people with diabetes, those who took an anthocyanin supplement twice a day experienced a 19% increase in HDL cholesterol, on average, along with other improvements in heart health markers (45). ° °In another study, when people with cholesterol issues took anthocyanin extract for 12 weeks, their HDL cholesterol levels increased by 13.7% (46). ° °Although these studies used extracts instead of foods, there are several fruits and vegetables that are very high in anthocyanins. These include eggplant, purple corn, red cabbage, blueberries, blackberries and black raspberries. ° °Bottom line: Consuming fruits and vegetables rich in anthocyanins may help increase HDL cholesterol levels. ° °8. Eat fatty fish often ° °The omega-3 fats in fatty fish provide major benefits to heart health, including a reduction in inflammation and better functioning of the cells that line your arteries (47, 48). ° °There's some research showing that eating fatty fish or taking fish oil may also help raise low levels of HDL cholesterol (49, 50, 51, 52, 53). ° °In a study of 33 heart disease patients, participants that consumed fatty fish four times per week experienced an increase in HDL cholesterol levels. The particle size of their HDL also increased (52). ° °In another study, overweight men who consumed herring five days a week for six  weeks had a 5% increase in HDL cholesterol, compared with their levels after eating lean pork and chicken five days a week (53). ° °However, there are a few studies that found no increase in HDL cholesterol in response to increased fish or omega-3 supplement intake (54, 55). ° °In addition to herring, other types of fatty fish that may help raise HDL cholesterol include salmon, sardines, mackerel and anchovies. ° °Bottom line: Eating fatty fish several times per week may help increase HDL cholesterol levels and provide other benefits to heart health. ° °9. Avoid artificial trans fats ° °Artificial trans fats have many negative health effects due to their inflammatory properties (56, 57). ° °There are two types of trans fats. One kind occurs naturally in animal products, including full-fat dairy. ° °In contrast, the artificial trans fats found in margarines and processed foods are created by adding hydrogen to unsaturated vegetable and seed oils. These fats are also   known as industrial trans fats or partially hydrogenated fats. ° °Research has shown that, in addition to increasing inflammation and contributing to several health problems, these artificial trans fats may lower HDL cholesterol levels. ° °In one study, researchers compared how people's HDL levels responded when they consumed different margarines. ° °The study found that participants' HDL cholesterol levels were 10% lower after consuming margarine containing partially hydrogenated soybean oil, compared to their levels after consuming palm oil (58). ° °Another controlled study followed 40 adults who had diets high in different types of trans fats. ° °They found that HDL cholesterol levels in women were significantly lower after they consumed the diet high in industrial trans fats, compared to the diet containing naturally occurring trans fats (59). ° °To protect heart health and keep HDL cholesterol in the healthy range, it's best to avoid artificial trans  fats altogether. ° °Bottom line: Artificial trans fats have been shown to lower HDL levels and increase inflammation, compared to other fats. ° °Take home message ° °Although your HDL cholesterol levels are partly determined by your genetics, there are many things you can do to naturally increase your own levels. ° °Fortunately, the practices that raise HDL cholesterol often provide other health benefits as well. ° °
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
## 2018-08-24 DIAGNOSIS — R030 Elevated blood-pressure reading, without diagnosis of hypertension: Secondary | ICD-10-CM | POA: Diagnosis not present

## 2018-08-24 DIAGNOSIS — G959 Disease of spinal cord, unspecified: Secondary | ICD-10-CM | POA: Diagnosis not present

## 2018-09-09 ENCOUNTER — Other Ambulatory Visit: Payer: Self-pay

## 2018-09-09 ENCOUNTER — Other Ambulatory Visit: Payer: Medicare HMO

## 2018-09-09 DIAGNOSIS — Z5181 Encounter for therapeutic drug level monitoring: Secondary | ICD-10-CM | POA: Diagnosis not present

## 2018-09-09 DIAGNOSIS — E039 Hypothyroidism, unspecified: Secondary | ICD-10-CM | POA: Diagnosis not present

## 2018-09-09 DIAGNOSIS — Z79899 Other long term (current) drug therapy: Secondary | ICD-10-CM | POA: Diagnosis not present

## 2018-09-10 LAB — TSH: TSH: 4.16 u[IU]/mL (ref 0.450–4.500)

## 2018-09-10 LAB — ALT: ALT: 15 IU/L (ref 0–44)

## 2018-09-10 LAB — T3, FREE: T3, Free: 2.7 pg/mL (ref 2.0–4.4)

## 2018-09-10 LAB — T4, FREE: Free T4: 0.94 ng/dL (ref 0.82–1.77)

## 2018-09-15 ENCOUNTER — Ambulatory Visit (INDEPENDENT_AMBULATORY_CARE_PROVIDER_SITE_OTHER): Payer: Medicare HMO | Admitting: Family Medicine

## 2018-09-15 ENCOUNTER — Encounter: Payer: Self-pay | Admitting: Family Medicine

## 2018-09-15 ENCOUNTER — Other Ambulatory Visit: Payer: Self-pay

## 2018-09-15 VITALS — BP 153/82 | Temp 97.4°F | Ht 70.0 in | Wt 145.8 lb

## 2018-09-15 DIAGNOSIS — R030 Elevated blood-pressure reading, without diagnosis of hypertension: Secondary | ICD-10-CM

## 2018-09-15 DIAGNOSIS — E559 Vitamin D deficiency, unspecified: Secondary | ICD-10-CM

## 2018-09-15 DIAGNOSIS — E785 Hyperlipidemia, unspecified: Secondary | ICD-10-CM

## 2018-09-15 DIAGNOSIS — E039 Hypothyroidism, unspecified: Secondary | ICD-10-CM | POA: Diagnosis not present

## 2018-09-15 DIAGNOSIS — K219 Gastro-esophageal reflux disease without esophagitis: Secondary | ICD-10-CM

## 2018-09-15 DIAGNOSIS — F411 Generalized anxiety disorder: Secondary | ICD-10-CM | POA: Diagnosis not present

## 2018-09-15 MED ORDER — PANTOPRAZOLE SODIUM 20 MG PO TBEC: 20.0000 mg | DELAYED_RELEASE_TABLET | Freq: Two times a day (BID) | ORAL | 1 refills | Status: DC

## 2018-09-15 NOTE — Progress Notes (Signed)
Virtual / live video office visit note for [employer], D.O- Primary Care Physician at [facility]   I connected with current patient today and beyond visually recognizing the correct individual, I verified that I am speaking with the correct person using two identifiers.  . Location of the patient: Home . Location of the provider: Office Only the patient (+/- their family members at pt's discretion) and myself were participating in the encounter    - This visit type was conducted due to national recommendations for restrictions regarding the COVID-19 Pandemic (e.g. social distancing) in an effort to limit this patient's exposure and mitigate transmission in our community.  This format is felt to be most appropriate for this patient at this time.   - The patient did have access to video technology today  yet, we had technical difficulties with this method, requiring transitioning to audio only.    - No physical exam could be performed with this format, beyond that communicated to [place] by the patient/ family members as noted.   - Additionally my office staff/ schedulers discussed with the patient that there may be a monetary charge related to this service, depending on patient's medical insurance.   The patient expressed understanding, and agreed to proceed.      History of Present Illness:  Because Damon [name]/patient is deaf, mom is speaking on his behalf today.  GERD: Last office visit we dec 20mg  protonix meds last OV from 1 tabs BID to 1 tabs QD.   Mom feels his chronic cough is now worse than prior.  He has had this chronically, and whenever he skips a dose-cough is worse or decrease his dose-cough is worse again.  His chronic cough is disruptive to his life and disruptive to other people around him - famotidine 20 takes q hs as well  CHol:   Started pt on crestor. No s-e and tol well.  ALT recheck within normal limits.  Vit D - taking once wkly -no difference in symptoms or feelings   Thyroid:  Started a little extra dose of Synthroid last OV-  half tablet 25 mcg 2 days/week.  Per mom, Damon [name] is tolerating the new medicine doses well.  she is only giving him half of the 14mcg  2 times weekly in addition to a 75 mcg daily.   -Labs rechecked -  his T4 has improved nicely.  All other labs are within normal limits.  -  no side effects and no current symptoms.  Recent Results (from the past 2160 hour(s))  VITAMIN D 25 Hydroxy (Vit-D Deficiency, Fractures)     Status: None   Collection Time: 07/30/18  9:58 AM  Result Value Ref Range   Vit D, 25-Hydroxy 32.2 30.0 - 100.0 ng/mL    Comment: Vitamin D deficiency has been defined by the [place] practice guideline as a level of serum 25-OH vitamin D less than 20 ng/mL (1,2). The Endocrine Society went on to further define vitamin D insufficiency as a level between 21 and 29 ng/mL (2). 1. IOM (Institute of Medicine). 2010. Dietary reference    intakes for calcium and D. [place]: The    [employer]. 2. Holick MF, Binkley [place], Bischoff-Ferrari HA, et al.    Evaluation, treatment, and prevention of vitamin D    deficiency: an Endocrine Society clinical practice    guideline. JCEM. 2011 Jul; 96(7):1911-30.   T4, free     Status:  None   Collection Time: 07/30/18  9:58 AM  Result Value Ref Range   Free T4 0.85 0.82 - 1.77 ng/dL  T3     Status: None   Collection Time: 07/30/18  9:58 AM  Result Value Ref Range   T3, Total 117 71 - 180 ng/dL  TSH     Status: None   Collection Time: 07/30/18  9:58 AM  Result Value Ref Range   TSH 4.420 0.450 - 4.500 uIU/mL  Lipid panel     Status: Abnormal   Collection Time: 07/30/18  9:58 AM  Result Value Ref Range   Cholesterol, Total 234 (H) 100 - 199 mg/dL   Triglycerides 114 0 - 149 mg/dL   HDL 43 >39 mg/dL   VLDL Cholesterol Cal 23 5 - 40 mg/dL   LDL Calculated 168 (H) 0 - 99 mg/dL   Chol/HDL Ratio 5.4 (H) 0.0 - 5.0 ratio     Comment:                                   T. Chol/HDL Ratio                                             Men  Women                               1/2 Avg.Risk  3.4    3.3                                   Avg.Risk  5.0    4.4                                2X Avg.Risk  9.6    7.1                                3X Avg.Risk 23.4   11.0   Hemoglobin A1c     Status: None   Collection Time: 07/30/18  9:58 AM  Result Value Ref Range   Hgb A1c MFr Bld 4.9 4.8 - 5.6 %    Comment:          Prediabetes: 5.7 - 6.4          Diabetes: >6.4          Glycemic control for adults with diabetes: <7.0    Est. average glucose Bld gHb Est-mCnc 94 mg/dL  Comprehensive metabolic panel     Status: None   Collection Time: 07/30/18  9:58 AM  Result Value Ref Range   Glucose 72 65 - 99 mg/dL   BUN 9 6 - 24 mg/dL   Creatinine, Ser 1.01 0.76 - 1.27 mg/dL   GFR calc non Af Amer 85 >59 mL/min/1.73   GFR calc Af Amer 98 >59 mL/min/1.73   BUN/Creatinine Ratio 9 9 - 20   Sodium 140 134 - 144 mmol/L   Potassium 4.3 3.5 - 5.2 mmol/L   Chloride 99 96 - 106 mmol/L   CO2 26 20 - 29 mmol/L  Calcium 9.0 8.7 - 10.2 mg/dL   Total Protein 6.5 6.0 - 8.5 g/dL   Albumin 4.2 3.8 - 4.9 g/dL   Globulin, Total 2.3 1.5 - 4.5 g/dL   Albumin/Globulin Ratio 1.8 1.2 - 2.2   Bilirubin Total <0.2 0.0 - 1.2 mg/dL   Alkaline Phosphatase 95 39 - 117 IU/L   AST 17 0 - 40 IU/L   ALT 14 0 - 44 IU/L  CBC with Differential/Platelet     Status: None   Collection Time: 07/30/18  9:58 AM  Result Value Ref Range   WBC 4.6 3.4 - 10.8 x10E3/uL   RBC 4.72 4.14 - 5.80 x10E6/uL   Hemoglobin 14.6 13.0 - 17.7 g/dL   Hematocrit 43.4 37.5 - 51.0 %   MCV 92 79 - 97 fL   MCH 30.9 26.6 - 33.0 pg   MCHC 33.6 31.5 - 35.7 g/dL   RDW 12.9 11.6 - 15.4 %   Platelets 209 150 - 450 x10E3/uL   Neutrophils 69 Not Estab. %   Lymphs 22 Not Estab. %   Monocytes 7 Not Estab. %   Eos 1 Not Estab. %   Basos 1 Not Estab. %   Neutrophils Absolute 3.2 1.4 -  7.0 x10E3/uL   Lymphocytes Absolute 1.0 0.7 - 3.1 x10E3/uL   Monocytes Absolute 0.3 0.1 - 0.9 x10E3/uL   EOS (ABSOLUTE) 0.1 0.0 - 0.4 x10E3/uL   Basophils Absolute 0.0 0.0 - 0.2 x10E3/uL   Immature Granulocytes 0 Not Estab. %   Immature Grans (Abs) 0.0 0.0 - 0.1 x10E3/uL  T4, free     Status: None   Collection Time: 09/09/18  9:39 AM  Result Value Ref Range   Free T4 0.94 0.82 - 1.77 ng/dL  T3, free     Status: None   Collection Time: 09/09/18  9:39 AM  Result Value Ref Range   T3, Free 2.7 2.0 - 4.4 pg/mL  TSH     Status: None   Collection Time: 09/09/18  9:39 AM  Result Value Ref Range   TSH 4.160 0.450 - 4.500 uIU/mL  ALT     Status: None   Collection Time: 09/09/18  9:39 AM  Result Value Ref Range   ALT 15 0 - 44 IU/L     Impression and Recommendations:    1. Hyperlipidemia, unspecified hyperlipidemia type   2. Hypothyroidism, unspecified type   3. Vitamin D insufficiency   4. Generalized anxiety disorder   5. Gastroesophageal reflux disease, esophagitis presence not specified   6. Elevated blood pressure reading    GERD: poorly controlled.  Patient unable to tolerate the pantoprazole only once a day due to increase in his chronic cough.  He will go back to twice daily of taking the 20 mg parental resolve in addition to his famotidine at night  HLD: new problem for pt.  Needs to start statin after R/B meds d/c pt.  All Q's answered -IN 4 mo -patient will need to come in in 4 months for a repeat fasting lipid profile since starting the Crestor, also repeat of his thyroid labs and also a repeat vitamin D level since starting the once weekly vitamin D.  BP- not at goal.  Also his blood pressure was elevated today and mom will keep better track of hers as well as patient's blood pressure.  -  She will recheck it at least 3 times per week and write it down and then we will review at next office visit.  If blood pressures are not less than 140/90 on a regular basis then we will  need adjustment to medications  - As part of my medical decision making, I reviewed the following data within the [place] History obtained from pt /family, CMA notes reviewed and incorporated if applicable, Labs reviewed, Radiograph/ tests reviewed if applicable and OV notes from prior OV's with me, as well as other specialists she/he has seen since seeing me last, were all reviewed and used in my medical decision making process today.   - Additionally, discussion had with patient regarding txmnt plan, their biases about that plan etc were used in my medical decision making today.   - The patient agreed with the plan and demonstrated an understanding of the instructions.   No barriers to understanding were identified.   - Red flag symptoms and signs discussed in detail.  Patient expressed understanding regarding what to do in case of emergency\ urgent symptoms.  The patient was advised to call back or seek an in-person evaluation if the symptoms worsen or if the condition fails to improve as anticipated.   Return in about 4 months (around 01/16/2019) for FBW, then OV 3 d after.    Orders Placed This Encounter  Procedures  . Lipid panel  . TSH  . T3  . T4, free  . VITAMIN D 25 Hydroxy (Vit-D Deficiency, Fractures)    Meds ordered this encounter  Medications  . pantoprazole (PROTONIX) 20 MG tablet    Sig: Take 1 tablet (20 mg total) by mouth 2 (two) times daily before a meal.    Dispense:  180 tablet    Refill:  1    Medications Discontinued During This Encounter  Medication Reason  . pantoprazole (PROTONIX) 20 MG tablet       I provided *23 minutes of face-to-face time during this encounter,with over 50% of the time in direct counseling on patients medical conditions/ medical concerns.  Additional time was spent with charting and coordination of care after the actual visit commenced.   Note:  This note was prepared with assistance of Dragon voice recognition  software. Occasional wrong-word or sound-a-like substitutions may have occurred due to the inherent limitations of voice recognition software.  [name], DO 09/16/18 8:46 PM      Patient Care Team    Relationship Specialty Notifications Start End  [name], DO PCP - General Family Medicine  02/16/18   Irene Shipper, MD Consulting Physician Gastroenterology  02/16/18   Consuella Lose, MD Consulting Physician Neurosurgery  03/12/18     -Vitals obtained; medications/ allergies reconciled;  personal medical, social, Sx etc.histories were updated by CMA, reviewed by me and are reflected in chart  Patient Active Problem List   Diagnosis Date Noted  . ? Hx of transient ischemic attack (TIA) 03/12/2018    Priority: High  . TBI (traumatic brain injury) [facility])     Priority: High  . Hyperlipidemia 04/11/2017    Priority: High  . Seizure disorder ([place]) 10/29/2006    Priority: High  . B12 deficiency 03/02/2018    Priority: Medium  . GERD (gastroesophageal reflux disease) 01/13/2018    Priority: Medium  . Cough variant asthma 04/26/2016    Priority: Medium  . Generalized anxiety disorder 01/17/2016    Priority: Medium  . Hypothyroidism 07/09/2007    Priority: Medium  . Chronic cough 09/29/2017    Priority: Low  . Elevated blood pressure reading 10/30/2018  . Neck pain- acute on  chronic  05/12/2018  . Muscle spasms of neck-    ( L upper back/ neck)   NEW ONSET 05/12/2018  . Status post cervical discectomy 03/12/2018  . Malnutrition of moderate degree 03/08/2018  . Spells of trembling   . Spinal stenosis in cervical region   . Cervical nerve root impingement   . Leg weakness, bilateral   . Hyperreflexia   . Spastic   . Depression 02/26/2018  . Left leg weakness 02/26/2018  . Dysphagia 02/16/2018  . Status post dilation of esophageal narrowing 02/16/2018  . Gastroesophageal reflux disease 02/16/2018  . Enlargement of R thyroid gland apprec in 2017 by prior  provider as well.  02/16/2018  . Communication disability- deaf -  needs extra time for communication 02/16/2018  . Heart palpitations 10/13/2017  . Upper airway cough syndrome 05/13/2017  . Allergic rhinitis 03/15/2016  . Vitamin D deficiency 07/19/2015  . Hypocalcemia 07/04/2015  . Family history of colonic polyps 12/07/2014  . Onychomycosis of toenail 01/04/2013     Current Meds  Medication Sig  . acetaminophen (TYLENOL) 500 MG tablet Take 500 mg by mouth every 6 (six) hours as needed for mild pain.  Marland Kitchen albuterol (PROVENTIL HFA;VENTOLIN HFA) 108 (90 Base) MCG/ACT inhaler Inhale 2 puffs into the lungs every 8 (eight) hours as needed for wheezing or shortness of breath.  . Cholecalciferol ([employer] VIT D 5000 HIGH-POTENCY PO) Take 1 tablet by mouth daily.   . diclofenac sodium (VOLTAREN) 1 % GEL Apply 4 g topically 4 (four) times daily. Prn Pain  . famotidine (PEPCID) 20 MG tablet Take 1 tablet (20 mg total) by mouth at bedtime.  Marland Kitchen FLUoxetine (PROZAC) 20 MG capsule Take 1 capsule (20 mg total) by mouth daily.  Marland Kitchen levothyroxine (SYNTHROID) 25 MCG tablet One half tab Tues and one half tab Fri (in addition to your 75 mcg Synthroid)  . pantoprazole (PROTONIX) 20 MG tablet Take 1 tablet (20 mg total) by mouth 2 (two) times daily before a meal.  . SYNTHROID 75 MCG tablet TAKE 1 TABLET BY MOUTH ONCE DAILY  . thiamine 100 MG tablet Take 1 tablet (100 mg total) by mouth daily.  . Vitamin D, Ergocalciferol, (DRISDOL) 1.25 MG (50000 UT) CAPS capsule Take one tablet wkly  . [DISCONTINUED] pantoprazole (PROTONIX) 20 MG tablet Take 1 tablet (20 mg total) by mouth daily.  . [DISCONTINUED] phenytoin (DILANTIN) 100 MG ER capsule Take 1-2 capsules (100-200 mg total) by mouth See admin instructions. Takes 200mg  in the am and rotates every other night taking 100-200mg   . [DISCONTINUED] rosuvastatin (CRESTOR) 5 MG tablet Take 1 tablet (5 mg total) by mouth at bedtime.     Allergies  Allergen Reactions  .  Bactrim [Sulfamethoxazole-Trimethoprim] Rash     ROS:  See above HPI for pertinent positives and negatives   Objective:   Blood pressure (!) 153/82, temperature (!) 97.4 F (36.3 C), height 5\' 10"  (1.778 m), weight 145 lb 12.8 oz (66.1 kg).  (if some vitals are omitted, this means that patient was UNABLE to obtain them even though they were asked to get them prior to OV today.  They were asked to call us at their earliest convenience with these once obtained.)  General: A & O * 3; visually in no acute distress; in usual state of health.  Skin: Visible skin appears normal and pt's usual skin color HEENT:  EOMI, head is normocephalic and atraumatic.  Sclera are anicteric. Neck has a good range of motion.  Lips  are noncyanotic Chest: normal chest excursion and movement Respiratory: speaking in full sentences, no conversational dyspnea; no use of accessory muscles Psych: insight good, mood- appears full

## 2018-09-16 ENCOUNTER — Other Ambulatory Visit: Payer: Self-pay | Admitting: Family Medicine

## 2018-09-16 DIAGNOSIS — G40909 Epilepsy, unspecified, not intractable, without status epilepticus: Secondary | ICD-10-CM

## 2018-09-21 ENCOUNTER — Other Ambulatory Visit: Payer: Self-pay

## 2018-09-21 ENCOUNTER — Other Ambulatory Visit (INDEPENDENT_AMBULATORY_CARE_PROVIDER_SITE_OTHER): Payer: Medicare HMO

## 2018-09-21 DIAGNOSIS — G40909 Epilepsy, unspecified, not intractable, without status epilepticus: Secondary | ICD-10-CM | POA: Diagnosis not present

## 2018-09-21 NOTE — Addendum Note (Signed)
Addended by: Lanier Prude D on: 09/21/2018 11:40 AM   Modules accepted: Level of Service

## 2018-09-21 NOTE — Progress Notes (Signed)
dil 

## 2018-09-22 LAB — PHENYTOIN LEVEL, TOTAL: Phenytoin (Dilantin), Serum: 17.4 ug/mL (ref 10.0–20.0)

## 2018-09-24 ENCOUNTER — Other Ambulatory Visit: Payer: Self-pay

## 2018-09-24 ENCOUNTER — Telehealth: Payer: Self-pay | Admitting: Family Medicine

## 2018-09-24 DIAGNOSIS — G40909 Epilepsy, unspecified, not intractable, without status epilepticus: Secondary | ICD-10-CM

## 2018-09-24 MED ORDER — PHENYTOIN SODIUM EXTENDED 100 MG PO CAPS: 100.0000 mg | ORAL_CAPSULE | ORAL | 0 refills | Status: DC

## 2018-09-24 NOTE — Telephone Encounter (Signed)
Pt's mother called states pt has only enough pill to last till Wednesday.  phenytoin (DILANTIN) 100 MG ER capsule [[id number]]   Order Details Dose: 100-200 mg Route: Oral Frequency: See admin instructions  Indications of Use: Seizure  Dispense Quantity: 360 capsule Refills: 0 Fills remaining: --        Sig: Take 1-2 capsules (100-200 mg total) by mouth See admin instructions. Takes 200mg  in the am and rotates every other night taking 100-200mg      ---Forwarding request to medical assistant that if approved send refill order to:   [id number] [address], [place] [phone] (Phone) [phone] (Fax)   --glh

## 2018-10-29 ENCOUNTER — Other Ambulatory Visit: Payer: Self-pay | Admitting: Family Medicine

## 2018-10-29 DIAGNOSIS — E785 Hyperlipidemia, unspecified: Secondary | ICD-10-CM

## 2018-10-30 DIAGNOSIS — R030 Elevated blood-pressure reading, without diagnosis of hypertension: Secondary | ICD-10-CM | POA: Insufficient documentation

## 2018-10-30 DIAGNOSIS — I10 Essential (primary) hypertension: Secondary | ICD-10-CM | POA: Insufficient documentation

## 2018-11-02 ENCOUNTER — Other Ambulatory Visit: Payer: Self-pay

## 2018-11-02 DIAGNOSIS — Z20822 Contact with and (suspected) exposure to covid-19: Secondary | ICD-10-CM

## 2018-11-04 LAB — NOVEL CORONAVIRUS, NAA: SARS-CoV-2, NAA: NOT DETECTED

## 2018-11-11 DIAGNOSIS — H40013 Open angle with borderline findings, low risk, bilateral: Secondary | ICD-10-CM | POA: Diagnosis not present

## 2018-11-11 DIAGNOSIS — H2513 Age-related nuclear cataract, bilateral: Secondary | ICD-10-CM | POA: Diagnosis not present

## 2018-12-01 ENCOUNTER — Other Ambulatory Visit: Payer: Self-pay | Admitting: Family Medicine

## 2018-12-01 DIAGNOSIS — F411 Generalized anxiety disorder: Secondary | ICD-10-CM

## 2018-12-01 DIAGNOSIS — R1310 Dysphagia, unspecified: Secondary | ICD-10-CM

## 2018-12-01 DIAGNOSIS — K219 Gastro-esophageal reflux disease without esophagitis: Secondary | ICD-10-CM

## 2019-01-05 ENCOUNTER — Other Ambulatory Visit: Payer: Self-pay | Admitting: Adult Health

## 2019-01-05 DIAGNOSIS — G40909 Epilepsy, unspecified, not intractable, without status epilepticus: Secondary | ICD-10-CM

## 2019-01-15 ENCOUNTER — Other Ambulatory Visit: Payer: Self-pay

## 2019-01-15 ENCOUNTER — Other Ambulatory Visit: Payer: Medicare HMO

## 2019-01-15 DIAGNOSIS — E785 Hyperlipidemia, unspecified: Secondary | ICD-10-CM | POA: Diagnosis not present

## 2019-01-15 DIAGNOSIS — E559 Vitamin D deficiency, unspecified: Secondary | ICD-10-CM | POA: Diagnosis not present

## 2019-01-15 DIAGNOSIS — E039 Hypothyroidism, unspecified: Secondary | ICD-10-CM | POA: Diagnosis not present

## 2019-01-16 LAB — LIPID PANEL
Chol/HDL Ratio: 4.3 ratio (ref 0.0–5.0)
Cholesterol, Total: 181 mg/dL (ref 100–199)
HDL: 42 mg/dL (ref >39)
LDL Chol Calc (NIH): 118 mg/dL — ABNORMAL HIGH (ref 0–99)
Triglycerides: 116 mg/dL (ref 0–149)
VLDL Cholesterol Cal: 21 mg/dL (ref 5–40)

## 2019-01-16 LAB — T4, FREE: Free T4: 0.93 ng/dL (ref 0.82–1.77)

## 2019-01-16 LAB — VITAMIN D 25 HYDROXY (VIT D DEFICIENCY, FRACTURES): Vit D, 25-Hydroxy: 75.5 ng/mL (ref 30.0–100.0)

## 2019-01-16 LAB — TSH: TSH: 3.01 u[IU]/mL (ref 0.450–4.500)

## 2019-01-16 LAB — T3: T3, Total: 103 ng/dL (ref 71–180)

## 2019-01-19 ENCOUNTER — Ambulatory Visit (INDEPENDENT_AMBULATORY_CARE_PROVIDER_SITE_OTHER): Payer: Medicare HMO | Admitting: Family Medicine

## 2019-01-19 ENCOUNTER — Encounter: Payer: Self-pay | Admitting: Family Medicine

## 2019-01-19 ENCOUNTER — Other Ambulatory Visit: Payer: Self-pay

## 2019-01-19 VITALS — BP 146/85 | HR 70 | Temp 97.7°F | Resp 12 | Ht 71.0 in | Wt 159.1 lb

## 2019-01-19 DIAGNOSIS — R7303 Prediabetes: Secondary | ICD-10-CM

## 2019-01-19 DIAGNOSIS — E785 Hyperlipidemia, unspecified: Secondary | ICD-10-CM

## 2019-01-19 DIAGNOSIS — J45909 Unspecified asthma, uncomplicated: Secondary | ICD-10-CM

## 2019-01-19 DIAGNOSIS — Z8673 Personal history of transient ischemic attack (TIA), and cerebral infarction without residual deficits: Secondary | ICD-10-CM | POA: Diagnosis not present

## 2019-01-19 DIAGNOSIS — R0982 Postnasal drip: Secondary | ICD-10-CM | POA: Insufficient documentation

## 2019-01-19 DIAGNOSIS — G40909 Epilepsy, unspecified, not intractable, without status epilepticus: Secondary | ICD-10-CM

## 2019-01-19 DIAGNOSIS — E559 Vitamin D deficiency, unspecified: Secondary | ICD-10-CM

## 2019-01-19 DIAGNOSIS — E7841 Elevated Lipoprotein(a): Secondary | ICD-10-CM | POA: Diagnosis not present

## 2019-01-19 DIAGNOSIS — J309 Allergic rhinitis, unspecified: Secondary | ICD-10-CM | POA: Diagnosis not present

## 2019-01-19 DIAGNOSIS — E038 Other specified hypothyroidism: Secondary | ICD-10-CM

## 2019-01-19 DIAGNOSIS — R1310 Dysphagia, unspecified: Secondary | ICD-10-CM

## 2019-01-19 DIAGNOSIS — S069X9D Unspecified intracranial injury with loss of consciousness of unspecified duration, subsequent encounter: Secondary | ICD-10-CM

## 2019-01-19 DIAGNOSIS — E039 Hypothyroidism, unspecified: Secondary | ICD-10-CM

## 2019-01-19 DIAGNOSIS — F411 Generalized anxiety disorder: Secondary | ICD-10-CM

## 2019-01-19 DIAGNOSIS — K219 Gastro-esophageal reflux disease without esophagitis: Secondary | ICD-10-CM

## 2019-01-19 MED ORDER — AZELASTINE HCL 0.1 % NA SOLN: 1.0000 | Freq: Two times a day (BID) | NASAL | 11 refills | Status: DC

## 2019-01-19 MED ORDER — LEVOCETIRIZINE DIHYDROCHLORIDE 5 MG PO TABS: 5.0000 mg | ORAL_TABLET | Freq: Every evening | ORAL | 1 refills | Status: DC

## 2019-01-19 MED ORDER — FAMOTIDINE 20 MG PO TABS: ORAL_TABLET | ORAL | 0 refills | Status: DC

## 2019-01-19 MED ORDER — VITAMIN D (ERGOCALCIFEROL) 1.25 MG (50000 UNIT) PO CAPS: ORAL_CAPSULE | ORAL | 3 refills | Status: DC

## 2019-01-19 MED ORDER — SYNTHROID 75 MCG PO TABS: 75.0000 ug | ORAL_TABLET | Freq: Every day | ORAL | 1 refills | Status: DC

## 2019-01-19 MED ORDER — LEVOTHYROXINE SODIUM 25 MCG PO TABS: ORAL_TABLET | ORAL | 1 refills | Status: DC

## 2019-01-19 MED ORDER — FLUOXETINE HCL 20 MG PO CAPS: ORAL_CAPSULE | ORAL | 0 refills | Status: DC

## 2019-01-19 MED ORDER — ALBUTEROL SULFATE HFA 108 (90 BASE) MCG/ACT IN AERS: 2.0000 | INHALATION_SPRAY | Freq: Three times a day (TID) | RESPIRATORY_TRACT | 1 refills | Status: DC | PRN

## 2019-01-19 MED ORDER — ROSUVASTATIN CALCIUM 10 MG PO TABS: 10.0000 mg | ORAL_TABLET | Freq: Every day | ORAL | 3 refills | Status: DC

## 2019-01-19 MED ORDER — ROSUVASTATIN CALCIUM 10 MG PO TABS: 10.0000 mg | ORAL_TABLET | Freq: Every day | ORAL | 1 refills | Status: DC

## 2019-01-19 MED ORDER — DILANTIN 100 MG PO CAPS: ORAL_CAPSULE | ORAL | 0 refills | Status: DC
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
## 2019-01-19 NOTE — Patient Instructions (Addendum)
We are referring you to neurologist Dr. Metta Clines.  Whom he is last seen back in July 2017.  You need to see him yearly or as often as they feel necessary  Please check blood pressure at home three days per week, or more often.   If BP is too high, follow up sooner than planned and let me know.  Patient's goal BP is around 130/80 or less.

## 2019-01-20 LAB — PHENYTOIN LEVEL, TOTAL: Phenytoin (Dilantin), Serum: 21.4 ug/mL (ref 10.0–20.0)

## 2019-01-20 LAB — HEMOGLOBIN A1C
Est. average glucose Bld gHb Est-mCnc: 91 mg/dL
Hgb A1c MFr Bld: 4.8 % (ref 4.8–5.6)

## 2019-01-26 ENCOUNTER — Other Ambulatory Visit: Payer: Self-pay | Admitting: Family Medicine

## 2019-01-26 DIAGNOSIS — E785 Hyperlipidemia, unspecified: Secondary | ICD-10-CM
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
## 2019-03-11 ENCOUNTER — Encounter: Payer: Self-pay | Admitting: Neurology

## 2019-03-11 ENCOUNTER — Other Ambulatory Visit: Payer: Self-pay

## 2019-03-11 ENCOUNTER — Ambulatory Visit: Payer: Medicare HMO | Admitting: Neurology

## 2019-03-11 VITALS — BP 142/83 | HR 88 | Ht 70.0 in | Wt 148.0 lb

## 2019-03-11 DIAGNOSIS — S069X9D Unspecified intracranial injury with loss of consciousness of unspecified duration, subsequent encounter: Secondary | ICD-10-CM

## 2019-03-11 DIAGNOSIS — G40909 Epilepsy, unspecified, not intractable, without status epilepticus: Secondary | ICD-10-CM | POA: Diagnosis not present

## 2019-03-11 NOTE — Patient Instructions (Signed)
The next time he has an episode, measure his blood sugar.  If it is not very low, then contact me and we can start a second seizure medication (in addition to Dilantin)  Follow up in 6 months.

## 2019-03-22 ENCOUNTER — Other Ambulatory Visit: Payer: Self-pay

## 2019-03-22 ENCOUNTER — Other Ambulatory Visit: Payer: Medicare HMO

## 2019-03-22 DIAGNOSIS — E785 Hyperlipidemia, unspecified: Secondary | ICD-10-CM

## 2019-03-22 DIAGNOSIS — E7841 Elevated Lipoprotein(a): Secondary | ICD-10-CM

## 2019-03-23 LAB — LIPID PANEL
Chol/HDL Ratio: 3.8 ratio (ref 0.0–5.0)
Cholesterol, Total: 175 mg/dL (ref 100–199)
HDL: 46 mg/dL (ref >39)
LDL Chol Calc (NIH): 114 mg/dL — ABNORMAL HIGH (ref 0–99)
Triglycerides: 81 mg/dL (ref 0–149)
VLDL Cholesterol Cal: 15 mg/dL (ref 5–40)

## 2019-03-23 LAB — ALT: ALT: 16 IU/L (ref 0–44)

## 2019-03-25 ENCOUNTER — Telehealth: Payer: Self-pay | Admitting: Neurology

## 2019-03-25 ENCOUNTER — Other Ambulatory Visit: Payer: Self-pay

## 2019-03-25 DIAGNOSIS — G40909 Epilepsy, unspecified, not intractable, without status epilepticus: Secondary | ICD-10-CM

## 2019-03-25 MED ORDER — DILANTIN 100 MG PO CAPS: ORAL_CAPSULE | ORAL | 3 refills | Status: DC

## 2019-03-25 NOTE — Telephone Encounter (Signed)
Mother is calling in about the Dilantin medication needing to the [facility] on file. She said this is the first time we are filling this as his PCP said its a neuro medication and we would need to fill it. Thanks!

## 2019-05-19 ENCOUNTER — Other Ambulatory Visit: Payer: Self-pay

## 2019-05-19 ENCOUNTER — Encounter: Payer: Self-pay | Admitting: Family Medicine

## 2019-05-19 ENCOUNTER — Ambulatory Visit (INDEPENDENT_AMBULATORY_CARE_PROVIDER_SITE_OTHER): Payer: Medicare HMO | Admitting: Family Medicine

## 2019-05-19 VITALS — BP 150/90 | HR 72 | Temp 98.4°F | Resp 12 | Ht 68.0 in | Wt 155.1 lb

## 2019-05-19 DIAGNOSIS — J45909 Unspecified asthma, uncomplicated: Secondary | ICD-10-CM | POA: Diagnosis not present

## 2019-05-19 DIAGNOSIS — I10 Essential (primary) hypertension: Secondary | ICD-10-CM

## 2019-05-19 DIAGNOSIS — S069X9S Unspecified intracranial injury with loss of consciousness of unspecified duration, sequela: Secondary | ICD-10-CM | POA: Diagnosis not present

## 2019-05-19 DIAGNOSIS — J309 Allergic rhinitis, unspecified: Secondary | ICD-10-CM

## 2019-05-19 DIAGNOSIS — E7841 Elevated Lipoprotein(a): Secondary | ICD-10-CM

## 2019-05-19 DIAGNOSIS — E559 Vitamin D deficiency, unspecified: Secondary | ICD-10-CM | POA: Diagnosis not present

## 2019-05-19 DIAGNOSIS — F411 Generalized anxiety disorder: Secondary | ICD-10-CM | POA: Diagnosis not present

## 2019-05-19 DIAGNOSIS — G40909 Epilepsy, unspecified, not intractable, without status epilepticus: Secondary | ICD-10-CM | POA: Diagnosis not present

## 2019-05-19 DIAGNOSIS — E785 Hyperlipidemia, unspecified: Secondary | ICD-10-CM | POA: Diagnosis not present

## 2019-05-19 MED ORDER — ROSUVASTATIN CALCIUM 20 MG PO TABS: 20.0000 mg | ORAL_TABLET | Freq: Every day | ORAL | 0 refills | Status: DC

## 2019-05-19 MED ORDER — LOSARTAN POTASSIUM-HCTZ 50-12.5 MG PO TABS: ORAL_TABLET | ORAL | 0 refills | Status: DC

## 2019-05-19 NOTE — Patient Instructions (Signed)
Your goal blood pressure should be 130/80 or less on a regular basis, or medications should be started/ modified.    Normal blood pressure is less than 120/80.    Hypertension Hypertension, commonly called high blood pressure, is when the force of blood pumping through the arteries is too strong. The arteries are the blood vessels that carry blood from the heart throughout the body. Hypertension forces the heart to work harder to pump blood and may cause arteries to become narrow or stiff. Having untreated or uncontrolled hypertension can cause heart attacks, strokes, kidney disease, and other problems. A blood pressure reading consists of a higher number over a lower number. Ideally, your blood pressure should be below 120/80. The first ("top") number is called the systolic pressure. It is a measure of the pressure in your arteries as your heart beats. The second ("bottom") number is called the diastolic pressure. It is a measure of the pressure in your arteries as the heart relaxes. What are the causes? The cause of this condition is not known. What increases the risk? Some risk factors for high blood pressure are under your control. Others are not. Factors you can change  Smoking.  Having type 2 diabetes mellitus, high cholesterol, or both.  Not getting enough exercise or physical activity.  Being overweight.  Having too much fat, sugar, calories, or salt (sodium) in your diet.  Drinking too much alcohol. Factors that are difficult or impossible to change  Having chronic kidney disease.  Having a family history of high blood pressure.  Age. Risk increases with age.  Race. You may be at higher risk if you are African-American.  Gender. Men are at higher risk than women before age 5. After age 85, women are at higher risk than men.  Having obstructive sleep apnea.  Stress. What are the signs or symptoms? Extremely high blood pressure (hypertensive crisis) may  cause:  Headache.  Anxiety.  Shortness of breath.  Nosebleed.  Nausea and vomiting.  Severe chest pain.  Jerky movements you cannot control (seizures).  How is this diagnosed? This condition is diagnosed by measuring your blood pressure while you are seated, with your arm resting on a surface. The cuff of the blood pressure monitor will be placed directly against the skin of your upper arm at the level of your heart. It should be measured at least twice using the same arm. Certain conditions can cause a difference in blood pressure between your right and left arms. Certain factors can cause blood pressure readings to be lower or higher than normal (elevated) for a short period of time:  When your blood pressure is higher when you are in a health care provider's office than when you are at home, this is called white coat hypertension. Most people with this condition do not need medicines.  When your blood pressure is higher at home than when you are in a health care provider's office, this is called masked hypertension. Most people with this condition may need medicines to control blood pressure.  If you have a high blood pressure reading during one visit or you have normal blood pressure with other risk factors:  You may be asked to return on a different day to have your blood pressure checked again.  You may be asked to monitor your blood pressure at home for 1 week or longer.  If you are diagnosed with hypertension, you may have other blood or imaging tests to help your health care provider understand  your overall risk for other conditions. How is this treated? This condition is treated by making healthy lifestyle changes, such as eating healthy foods, exercising more, and reducing your alcohol intake. Your health care provider may prescribe medicine if lifestyle changes are not enough to get your blood pressure under control, and if:  Your systolic blood pressure is above  130.  Your diastolic blood pressure is above 80.  Your personal target blood pressure may vary depending on your medical conditions, your age, and other factors. Follow these instructions at home: Eating and drinking  Eat a diet that is high in fiber and potassium, and low in sodium, added sugar, and fat. An example eating plan is called the DASH (Dietary Approaches to Stop Hypertension) diet. To eat this way: ? Eat plenty of fresh fruits and vegetables. Try to fill half of your plate at each meal with fruits and vegetables. ? Eat whole grains, such as whole wheat pasta, brown rice, or whole grain bread. Fill about one quarter of your plate with whole grains. ? Eat or drink low-fat dairy products, such as skim milk or low-fat yogurt. ? Avoid fatty cuts of meat, processed or cured meats, and poultry with skin. Fill about one quarter of your plate with lean proteins, such as fish, chicken without skin, beans, eggs, and tofu. ? Avoid premade and processed foods. These tend to be higher in sodium, added sugar, and fat.  Reduce your daily sodium intake. Most people with hypertension should eat less than 1,500 mg of sodium a day.  Limit alcohol intake to no more than 1 drink a day for nonpregnant women and 2 drinks a day for men. One drink equals 12 oz of beer, 5 oz of wine, or 1 oz of hard liquor. Lifestyle  Work with your health care provider to maintain a healthy body weight or to lose weight. Ask what an ideal weight is for you.  Get at least 30 minutes of exercise that causes your heart to beat faster (aerobic exercise) most days of the week. Activities may include walking, swimming, or biking.  Include exercise to strengthen your muscles (resistance exercise), such as pilates or lifting weights, as part of your weekly exercise routine. Try to do these types of exercises for 30 minutes at least 3 days a week.  Do not use any products that contain nicotine or tobacco, such as cigarettes and  e-cigarettes. If you need help quitting, ask your health care provider.  Monitor your blood pressure at home as told by your health care provider.  Keep all follow-up visits as told by your health care provider. This is important. Medicines  Take over-the-counter and prescription medicines only as told by your health care provider. Follow directions carefully. Blood pressure medicines must be taken as prescribed.  Do not skip doses of blood pressure medicine. Doing this puts you at risk for problems and can make the medicine less effective.  Ask your health care provider about side effects or reactions to medicines that you should watch for. Contact a health care provider if:  You think you are having a reaction to a medicine you are taking.  You have headaches that keep coming back (recurring).  You feel dizzy.  You have swelling in your ankles.  You have trouble with your vision. Get help right away if:  You develop a severe headache or confusion.  You have unusual weakness or numbness.  You feel faint.  You have severe pain in your chest  or abdomen.  You vomit repeatedly.  You have trouble breathing. Summary  Hypertension is when the force of blood pumping through your arteries is too strong. If this condition is not controlled, it may put you at risk for serious complications.  Your personal target blood pressure may vary depending on your medical conditions, your age, and other factors. For most people, a normal blood pressure is less than 120/80.  Hypertension is treated with lifestyle changes, medicines, or a combination of both. Lifestyle changes include weight loss, eating a healthy, low-sodium diet, exercising more, and limiting alcohol. This information is not intended to replace advice given to you by your health care provider. Make sure you discuss any questions you have with your health care provider. Document Released: 02/18/2005 Document Revised: 01/17/2016  Document Reviewed: 01/17/2016 Elsevier Interactive Patient Education  2018 [employer].    How to Take Your Blood Pressure   Blood pressure is a measurement of how strongly your blood is pressing against the walls of your arteries. Arteries are blood vessels that carry blood from your heart throughout your body. Your health care provider takes your blood pressure at each office visit. You can also take your own blood pressure at home with a blood pressure machine. You may need to take your own blood pressure:  To confirm a diagnosis of high blood pressure (hypertension).  To monitor your blood pressure over time.  To make sure your blood pressure medicine is working.  Supplies needed: To take your blood pressure, you will need a blood pressure machine. You can buy a blood pressure machine, or blood pressure monitor, at most drugstores or online. There are several types of home blood pressure monitors. When choosing one, consider the following:  Choose a monitor that has an arm cuff.  Choose a monitor that wraps snugly around your upper arm. You should be able to fit only one finger between your arm and the cuff.  Do not choose a monitor that measures your blood pressure from your wrist or finger.  Your health care provider can suggest a reliable monitor that will meet your needs. How to prepare To get the most accurate reading, avoid the following for 30 minutes before you check your blood pressure:  Drinking caffeine.  Drinking alcohol.  Eating.  Smoking.  Exercising.  Five minutes before you check your blood pressure:  Empty your bladder.  Sit quietly without talking in a dining chair, rather than in a soft couch or armchair.  How to take your blood pressure To check your blood pressure, follow the instructions in the manual that came with your blood pressure monitor. If you have a digital blood pressure monitor, the instructions may be as follows: 1. Sit up  straight. 2. Place your feet on the floor. Do not cross your ankles or legs. 3. Rest your left arm at the level of your heart on a table or desk or on the arm of a chair. 4. Pull up your shirt sleeve. 5. Wrap the blood pressure cuff around the upper part of your left arm, 1 inch (2.5 cm) above your elbow. It is best to wrap the cuff around bare skin. 6. Fit the cuff snugly around your arm. You should be able to place only one finger between the cuff and your arm. 7. Position the cord inside the groove of your elbow. 8. Press the power button. 9. Sit quietly while the cuff inflates and deflates. 10. Read the digital reading on the monitor  screen and write it down (record it). 11. Wait 2-3 minutes, then repeat the steps, starting at step 1.  What does my blood pressure reading mean? A blood pressure reading consists of a higher number over a lower number. Ideally, your blood pressure should be below 120/80. The first ("top") number is called the systolic pressure. It is a measure of the pressure in your arteries as your heart beats. The second ("bottom") number is called the diastolic pressure. It is a measure of the pressure in your arteries as the heart relaxes. Blood pressure is classified into four stages. The following are the stages for adults who do not have a short-term serious illness or a chronic condition. Systolic pressure and diastolic pressure are measured in a unit called mm Hg. Normal  Systolic pressure: below 123456.  Diastolic pressure: below 80. Elevated  Systolic pressure: [id number].  Diastolic pressure: below 80. Hypertension stage 1  Systolic pressure: 0000000.  Diastolic pressure: [id number]. Hypertension stage 2  Systolic pressure: [id number] or above.  Diastolic pressure: 90 or above. You can have prehypertension or hypertension even if only the systolic or only the diastolic number in your reading is higher than normal. Follow these instructions at home:  Check your blood  pressure as often as recommended by your health care provider.  Take your monitor to the next appointment with your health care provider to make sure: ? That you are using it correctly. ? That it provides accurate readings.  Be sure you understand what your goal blood pressure numbers are.  Tell your health care provider if you are having any side effects from blood pressure medicine. Contact a health care provider if:  Your blood pressure is consistently high. Get help right away if:  Your systolic blood pressure is higher than 180.  Your diastolic blood pressure is higher than 110. This information is not intended to replace advice given to you by your health care provider. Make sure you discuss any questions you have with your health care provider. Document Released: 07/28/2015 Document Revised: 10/10/2015 Document Reviewed: 07/28/2015 Elsevier Interactive Patient Education  2018 [place] for a Low Cholesterol, Low Saturated Fat Diet   Fats - Limit total intake of fats and oils. - Avoid butter, stick margarine, shortening, lard, palm and coconut oils. - Limit mayonnaise, salad dressings, gravies and sauces, unless they are homemade with low-fat ingredients. - Limit chocolate. - Choose low-fat and nonfat products, such as low-fat mayonnaise, low-fat or non-hydrogenated peanut butter, low-fat or fat-free salad dressings and nonfat gravy. - Use vegetable oil, such as canola or olive oil. - Look for margarine that does not contain trans fatty acids. - Use nuts in moderate amounts. - Read ingredient labels carefully to determine both amount and type of fat present in foods. Limit saturated and trans fats! - Avoid high-fat processed and convenience foods.  Meats and Meat Alternatives - Choose fish, chicken, [place] and lean meats. - Use dried beans, peas, lentils and tofu. - Limit egg yolks to three to four per week. - If you eat red meat, limit to no more  than three servings per week and choose loin or round cuts. - Avoid fatty meats, such as bacon, sausage, franks, luncheon meats and ribs. - Avoid all organ meats, including liver.  Dairy - Choose nonfat or low-fat milk, yogurt and cottage cheese. - Most cheeses are high in fat. Choose cheeses made from non-fat milk, such as mozzarella and ricotta cheese. - Choose  light or fat-free cream cheese and sour cream. - Avoid cream and sauces made with cream.  Fruits and Vegetables - Eat a wide variety of fruits and vegetables. - Use lemon juice, vinegar or "mist" olive oil on vegetables. - Avoid adding sauces, fat or oil to vegetables.  Breads, Cereals and Grains - Choose whole-grain breads, cereals, pastas and rice. - Avoid high-fat snack foods, such as granola, cookies, pies, pastries, doughnuts and croissants.  Cooking Tips - Avoid deep fried foods. - Trim visible fat off meats and remove skin from poultry before cooking. - Bake, broil, boil, poach or roast poultry, fish and lean meats. - Drain and discard fat that drains out of meat as you cook it. - Add little or no fat to foods. - Use vegetable oil sprays to grease pans for cooking or baking. - Steam vegetables. - Use herbs or no-oil marinades to flavor foods.

## 2019-05-19 NOTE — Progress Notes (Signed)
Impression and Recommendations:    1. Seizure disorder ([place])   2. HTN, goal below 130/80   3. Hyperlipidemia, unspecified hyperlipidemia type   4. Elevated lipoprotein(a)   5. Allergic rhinitis, unspecified seasonality, unspecified trigger   6. Generalized anxiety disorder   7. Reactive airway disease without complication, unspecified asthma severity, unspecified whether persistent   8. Vitamin D insufficiency     - Last OV January 19, 2019.  Neurological Concerns - Seizure Disorder, Traumatic Brain Injury, Hx TIA - Patient returned to [name] of neurology on 03/11/2019. - Advised patient and mom to continue follow up with Neurology.  - Discussed that patient's dilantin was 21.4 last check 01/19/2019. - Reviewed that neurology will monitor patient's dilantin levels moving forward.  - Will continue to monitor alongside specialist.  Hypertension - BP elevated in office last OV, 146/85. - BP elevated on intake today, 144/87.  - Discussed goal BP of 130/80 or less.  - Patient has never been managed on a BP medicine in the past. - Begin blood pressure medicine today, losartan-HCTZ. - Discussed goal to control potassium given the patient's history of seizure disorder.  - Lifestyle changes such as dash and heart healthy diets and engaging in a regular exercise program discussed extensively with patient.   - To help control his blood pressure, encouraged patient to utilize his home exercise bike daily, or walk outside.  - Ambulatory blood pressure monitoring encouraged at least 3 times weekly.  Keep log and bring in every office visit.  Reminded patient that if he ever feels poorly in any way, to check his blood pressure and pulse.  - We will continue to monitor.  Hyperlipidemia - Last OV in November 2020, increased patient's dose of statin medication to 10 mg from 5 mg prior.  - Last FLP obtained one month ago: Triglycerides = 81, down from 116 prior. LDL = 114,  elevated, down slightly from 118 prior. HDL = 46, up from 42 prior.  The 10-year ASCVD risk score [name], et al., 2013) is: 5.7%  - Discussed that patient's cholesterol has improved slightly from prior. - Recommended doubling statin medication dosage again. - Increase statin dosage to 20 mg today.  - Encouraged patient to engage in prudent dietary changes such as low saturated & trans fat diets for hyperlipidemia.  - To help control his cholesterol, STRONGLY advised patient to eat less fried chicken, and work on adopting a regular exercise routine.  - We will continue to monitor and re-check as discussed.  Allergic Rhinitis, PND with cough - Began allergy medications last OV, Xyzal and Astelin. - Encouraged patient to continue prescriptions prudently as advised.  - Advised the patient to use AYR or Neilmed sinus rinses BID followed by Astelin nasal spray BID (one spray to each nostril).  Advised that the patient may also continue to incorporate the Strum encouraged patient to utilize the Astelin nasal spray as advised despite the bad taste.  - Will continue to monitor.    Meds ordered this encounter  Medications  . losartan-hydrochlorothiazide (HYZAAR) 50-12.5 MG tablet    Sig: Start 1/2 tab QD for 8 days and inc to 1 po qd prn to goal <130/80    Dispense:  90 tablet    Refill:  0  . rosuvastatin (CRESTOR) 20 MG tablet    Sig: Take 1 tablet (20 mg total) by mouth at bedtime.    Dispense:  90 tablet  Refill:  0    Medications Discontinued During This Encounter  Medication Reason  . rosuvastatin (CRESTOR) 10 MG tablet Reorder      Please see AVS handed out to patient at the end of our visit for further patient instructions/ counseling done pertaining to today's office visit.   Return INc crestor and added losartan/hctz--->, for f/up in 4 months, re-check CMP, FLP 3 d prior to OV(bring BP log).     Note:  This note was prepared with assistance of  Dragon voice recognition software. Occasional wrong-word or sound-a-like substitutions may have occurred due to the inherent limitations of voice recognition software.   The [place] was signed into law in 2016 which includes the topic of electronic health records.  This provides immediate access to information in MyChart.  This includes consultation notes, operative notes, office notes, lab results and pathology reports.  If you have any questions about what you read please let us know at your next visit or call us at the office.  We are right here with you.   This case required medical decision making of at least moderate complexity.  This document serves as a record of services personally performed by [name], DO. It was created on her behalf by Toni Amend, a trained medical scribe. The creation of this record is based on the scribe's personal observations and the provider's statements to them.   This case required medical decision making of at least moderate complexity. The above documentation from Toni Amend, medical scribe, has been reviewed by Marjory Sneddon, D.O.       --------------------------------------------------------------------------------------------------------------------------------------------------------------------------------------------------------------------------------------------    Subjective:     Damon [name], am serving as scribe for Dr..   HPI: Damon [name]. is a 54 y.o. male who presents to [place] at [facility] today for issues as discussed below.  Mom is here to assist with translation for patient as the patient is deaf.  - Historical Hand Injury, Healing Patient's right middle finger and ring finger with ecchymosis underneath the nail beds from an old injury.  Patient has no pain today.  - General Health Per patient, overall has been feeling well; sleeping well at  night.  Mom says "only time he sleeps [during the day] is if he doesn't feel good."  Per mom, "he plays too many video games on the computer."  Now that it's getting warmer, mom says they are planning to go walking outside more.  Mom got him an exercise bike, and he does utilize this sometimes briefly while watching TV.  Mom notes he gets irritable sometimes, "but we all do."   - Seizures, monitored by Neurology Per mom, the patient has not had any episodes of seizures lately.  Notes the neurologist is now watching the patient.  Mom states the neurologist wasn't sure if the patient was having seizures or something else (possibly tremors).  Per mom, "[the neurologist] left his medicine the way it is, and we go back to see how that's going."    - Environmental and Seasonal Allergies He continues to have a cough, which they believe is related to allergies.  Patient's cough has worsened since allergy season has arrived.    Mom notes that the patient is taking the Xyzal every night, and taking the Claritin in the morning.  He also takes tylenol in the morning.   --vHe was told to not take both antihistamines daily!!   Mom notes he's been  using the Astelin "a little bit," but not regularly    HPI:  Hyperlipidemia:  54 y.o. male here for cholesterol follow-up.   - Patient reports good compliance with treatment plan of: medication.  He continues eating [employer] often in the morning.  - Patient denies any acute concerns or problems with management plan   - He denies new onset of: myalgias, arthralgias, increased fatigue more than normal, chest pains, exercise intolerance, shortness of breath, dizziness, visual changes, headache, lower extremity swelling or claudication.   Most recent cholesterol panel was:  Lab Results  Component Value Date   CHOL 175 03/22/2019   HDL 46 03/22/2019   LDLCALC 114 (H) 03/22/2019   TRIG 81 03/22/2019   CHOLHDL 3.8 03/22/2019   Hepatic Function Latest  Ref Rng & Units 03/22/2019 09/09/2018 07/30/2018  Total Protein 6.0 - 8.5 g/dL - - 6.5  Albumin 3.8 - 4.9 g/dL - - 4.2  AST 0 - 40 IU/L - - 17  ALT 0 - 44 IU/L _0 Alk Phosphatase 39 - 117 IU/L - - 95  Total Bilirubin 0.0 - 1.2 mg/dL - - <0.2  Bilirubin, Direct 0.0 - 0.3 mg/dL - - -      HPI:  Hypertension:  -  His blood pressure at home has been suboptimally controlled, running over 130/80 on average at home.  - He denies new onset of: chest pain, exercise intolerance, shortness of breath, dizziness, visual changes, headache, lower extremity swelling or claudication.   Last 3 blood pressure readings in our office are as follows: BP Readings from Last 3 Encounters:  05/19/19 (!) 150/90  03/11/19 (!) 142/83  01/19/19 (!) 146/85   Filed Weights   05/19/19 0907  Weight: 155 lb 1.6 oz (70.4 kg)       Wt Readings from Last 3 Encounters:  05/19/19 155 lb 1.6 oz (70.4 kg)  03/11/19 148 lb (67.1 kg)  01/19/19 159 lb 1.6 oz (72.2 kg)   BP Readings from Last 3 Encounters:  05/19/19 (!) 150/90  03/11/19 (!) 142/83  01/19/19 (!) 146/85   Pulse Readings from Last 3 Encounters:  05/19/19 72  03/11/19 88  01/19/19 70   BMI Readings from Last 3 Encounters:  05/19/19 23.58 kg/m  03/11/19 21.24 kg/m  01/19/19 22.19 kg/m     Patient Care Team    Relationship Specialty Notifications Start End  [name], DO PCP - General Family Medicine  02/16/18   Irene Shipper, MD Consulting Physician Gastroenterology  02/16/18   Consuella Lose, MD Consulting Physician Neurosurgery  03/12/18   Pieter Partridge, [place] Physician Neurology  03/10/19      Patient Active Problem List   Diagnosis Date Noted  . ? Hx of transient ischemic attack (TIA) 03/12/2018  . TBI (traumatic brain injury) ([place])   . Hyperlipidemia 04/11/2017  . Seizure disorder ([place]) 10/29/2006  . B12 deficiency 03/02/2018  . GERD (gastroesophageal reflux disease) 01/13/2018  . Cough variant asthma  04/26/2016  . Generalized anxiety disorder 01/17/2016  . Hypothyroidism 07/09/2007  . Chronic cough 09/29/2017  . Elevated lipoprotein(a) 01/19/2019  . PND (post-nasal drip) with cough 01/19/2019  . Elevated blood pressure reading 10/30/2018  . Neck pain- acute on chronic  05/12/2018  . Muscle spasms of neck-    ( L upper back/ neck)   NEW ONSET 05/12/2018  . Status post cervical discectomy 03/12/2018  . Malnutrition of moderate degree 03/08/2018  . Spells of trembling   . Spinal  stenosis in cervical region   . Cervical nerve root impingement   . Leg weakness, bilateral   . Hyperreflexia   . Spastic   . Depression 02/26/2018  . Left leg weakness 02/26/2018  . Dysphagia 02/16/2018  . Status post dilation of esophageal narrowing 02/16/2018  . Gastroesophageal reflux disease 02/16/2018  . Enlargement of R thyroid gland apprec in 2017 by prior provider as well.  02/16/2018  . Communication disability- deaf -  needs extra time for communication 02/16/2018  . Heart palpitations 10/13/2017  . Upper airway cough syndrome 05/13/2017  . Allergic rhinitis 03/15/2016  . Vitamin D deficiency 07/19/2015  . Hypocalcemia 07/04/2015  . Family history of colonic polyps 12/07/2014  . Onychomycosis of toenail 01/04/2013    Past Medical history, Surgical history, Family history, Social history, Allergies and Medications have been entered into the medical record, reviewed and changed as needed.    Current Meds  Medication Sig  . acetaminophen (TYLENOL) 500 MG tablet Take 500 mg by mouth every 6 (six) hours as needed for mild pain.  Marland Kitchen AFLURIA QUADRIVALENT 0.5 ML injection   . albuterol (VENTOLIN HFA) 108 (90 Base) MCG/ACT inhaler Inhale 2 puffs into the lungs every 8 (eight) hours as needed for wheezing or shortness of breath.  Marland Kitchen azelastine (ASTELIN) 0.1 % nasal spray Place 1 spray into both nostrils 2 (two) times daily. 1 spray each nostril twice daily after sinus rinses  . Cholecalciferol ([employer]  VIT D 5000 HIGH-POTENCY PO) Take 1 tablet by mouth daily.   Marland Kitchen DILANTIN 100 MG ER capsule TAKE 2 CAPSULES(200 MG) IN THE AM AND ROTATE EVERY OTHER NIGHT TAKEING 1 OR 2 CAPSULES  . famotidine (PEPCID) 20 MG tablet TAKE 1 TABLET(20 MG) BY MOUTH AT BEDTIME  . FLUoxetine (PROZAC) 20 MG capsule TAKE 1 CAPSULE(20 MG) BY MOUTH DAILY  . levocetirizine (XYZAL) 5 MG tablet Take 1 tablet (5 mg total) by mouth every evening.  Marland Kitchen levothyroxine (SYNTHROID) 25 MCG tablet One half tab Tues and one half tab Fri (in addition to your 75 mcg Synthroid)  . pantoprazole (PROTONIX) 20 MG tablet Take 1 tablet (20 mg total) by mouth 2 (two) times daily before a meal.  . rosuvastatin (CRESTOR) 20 MG tablet Take 1 tablet (20 mg total) by mouth at bedtime.  Marland Kitchen SYNTHROID 75 MCG tablet Take 1 tablet (75 mcg total) by mouth daily.  . Vitamin D, Ergocalciferol, (DRISDOL) 1.25 MG (50000 UT) CAPS capsule Take one tablet wkly  . [DISCONTINUED] rosuvastatin (CRESTOR) 10 MG tablet Take 1 tablet (10 mg total) by mouth at bedtime.    Allergies:  Allergies  Allergen Reactions  . Bactrim [Sulfamethoxazole-Trimethoprim] Rash     Review of Systems:  A fourteen system review of systems was performed and found to be positive as per HPI.   Objective:   Blood pressure (!) 150/90, pulse 72, temperature 98.4 F (36.9 C), temperature source Oral, resp. rate 12, height 5' 8" (1.727 m), weight 155 lb 1.6 oz (70.4 kg), SpO2 98 %. Body mass index is 23.58 kg/m. General:  Well Developed, well nourished, appropriate for stated age.  Neuro:  Alert and oriented,  extra-ocular muscles intact  HEENT:  Normocephalic, atraumatic, neck supple, no carotid bruits appreciated  Skin:  no gross rash, warm, pink. Cardiac:  RRR, S1 S2 Respiratory:  ECTA B/L and A/P, Not using accessory muscles, speaking in full sentences- unlabored. Vascular:  Ext warm, no cyanosis apprec.; cap RF less 2 sec. Psych:  No HI/SI, judgement  and insight good, Euthymic mood.  Full Affect. Hand:  Right middle finger and ring finger with black-appearing ecchymosis underneath the nail beds from an old injury.  Patient with no pain today upon palp.

## 2019-05-28 ENCOUNTER — Other Ambulatory Visit: Payer: Self-pay | Admitting: Family Medicine

## 2019-05-28 DIAGNOSIS — R1310 Dysphagia, unspecified: Secondary | ICD-10-CM

## 2019-05-28 DIAGNOSIS — K219 Gastro-esophageal reflux disease without esophagitis: Secondary | ICD-10-CM

## 2019-06-09 ENCOUNTER — Other Ambulatory Visit: Payer: Self-pay | Admitting: Family Medicine

## 2019-06-09 DIAGNOSIS — E039 Hypothyroidism, unspecified: Secondary | ICD-10-CM

## 2019-06-09 DIAGNOSIS — F411 Generalized anxiety disorder: Secondary | ICD-10-CM

## 2019-07-16 ENCOUNTER — Other Ambulatory Visit: Payer: Self-pay | Admitting: Family Medicine

## 2019-07-16 ENCOUNTER — Other Ambulatory Visit: Payer: Self-pay | Admitting: Adult Health

## 2019-07-16 DIAGNOSIS — E038 Other specified hypothyroidism: Secondary | ICD-10-CM

## 2019-07-19 ENCOUNTER — Telehealth: Payer: Self-pay | Admitting: Neurology

## 2019-07-19 NOTE — Telephone Encounter (Signed)
Clarify with patient's mother what is the dose/how patient takes Dilantin.

## 2019-07-19 NOTE — Telephone Encounter (Signed)
[name] called in about her son's Dilantin. She said they tried to get the medication filled at the [facility] on [address], but the pharmacist stated their insurance wouldn't let them fill it because it was too soon. The mother mentioned 200mg . Can someone give her a call to figure out what the issue could be?

## 2019-07-20 NOTE — Telephone Encounter (Signed)
He was prescribed 120 capsules a month, which is more than enough.  I don't have any explanation for why he is out sooner than needed.  I recommend that they start using a pillbox if not already done.

## 2019-07-20 NOTE — Telephone Encounter (Signed)
Per the pt mother pt take the Dilatin 200mg  2 cap in the AM M-S, Then Alternate 1 to 2 cap in the Pm.

## 2019-07-20 NOTE — Telephone Encounter (Signed)
Spoke to [employer] with direction of medication and QTY.  Also spoke to pt mother, Script directions given to pharmacy up to the pt insurance if they will cover.

## 2019-07-26 ENCOUNTER — Other Ambulatory Visit: Payer: Self-pay | Admitting: Physician Assistant

## 2019-07-26 ENCOUNTER — Telehealth: Payer: Self-pay | Admitting: Physician Assistant

## 2019-07-26 DIAGNOSIS — E039 Hypothyroidism, unspecified: Secondary | ICD-10-CM

## 2019-07-26 DIAGNOSIS — E038 Other specified hypothyroidism: Secondary | ICD-10-CM

## 2019-07-26 MED ORDER — SYNTHROID 75 MCG PO TABS: 75.0000 ug | ORAL_TABLET | Freq: Every day | ORAL | 0 refills | Status: DC

## 2019-07-26 MED ORDER — LEVOCETIRIZINE DIHYDROCHLORIDE 5 MG PO TABS: 5.0000 mg | ORAL_TABLET | Freq: Every evening | ORAL | 1 refills | Status: DC

## 2019-07-26 NOTE — Telephone Encounter (Signed)
Parent request refill on several meds :   levocetirizine (XYZAL) 5 MG tablet [id number]   Order Details Dose: 5 mg Route: Oral Frequency: Every evening  Dispense Quantity: 90 tablet Refills: 1       Sig: Take 1 tablet (5 mg total) by mouth every evening.       &   levothyroxine (SYNTHROID) 25 MCG tablet [id number]   Order Details Dose, Route, Frequency: As Directed  Dispense Quantity: 24 tablet Refills: 1       Sig: TAKE 1/2 TABLET ON TUESDAYS AND FRIDAYS(IN ADDITION TO YOUR 75MCG LEVOTHYROXINE TABLETS)   ----- Patient's LOV 05/19/19----- If approved please send refill order to :  [facility] RO:[id number] [address], [place] [phone] (Phone) [phone] (Fax   --glh

## 2019-07-26 NOTE — Telephone Encounter (Signed)
Parent came by office  Apologies for mix up)---- states Patient take 2 diff strengths of Synthroid MCGs & needs the one for 75 MCG not the ( 25 MCG)advised the voicemail just said needed refill on Levothyroxine not specifically which strength .   ---Forwarding refill request for specific Synthroid 75MCG medication Rx :  SYNTHROID 75 MCG tablet [id number]   Order Details Dose: 75 mcg Route: Oral Frequency: Daily  Dispense Quantity: 90 tablet Refills: 1       Sig: Take 1 tablet (75 mcg total) by mouth daily.       ----Pt uses :  Preferred Pharmacies      [facility] [phone] - [address], [place] [phone] (Phone) [phone] (Fax)   --glh

## 2019-08-11 ENCOUNTER — Other Ambulatory Visit: Payer: Self-pay | Admitting: Family Medicine

## 2019-08-11 DIAGNOSIS — E785 Hyperlipidemia, unspecified: Secondary | ICD-10-CM

## 2019-08-11 DIAGNOSIS — E7841 Elevated Lipoprotein(a): Secondary | ICD-10-CM

## 2019-08-19 ENCOUNTER — Telehealth: Payer: Self-pay | Admitting: Physician Assistant

## 2019-08-19 DIAGNOSIS — E785 Hyperlipidemia, unspecified: Secondary | ICD-10-CM

## 2019-08-19 DIAGNOSIS — E7841 Elevated Lipoprotein(a): Secondary | ICD-10-CM

## 2019-08-19 MED ORDER — ROSUVASTATIN CALCIUM 20 MG PO TABS: 20.0000 mg | ORAL_TABLET | Freq: Every day | ORAL | 0 refills | Status: DC

## 2019-08-19 NOTE — Addendum Note (Signed)
Addended by: Mickel Crow on: 08/19/2019 09:13 AM   Modules accepted: Orders

## 2019-08-19 NOTE — Telephone Encounter (Signed)
Patient is requesting a refill of his rosuvastatin, if approved please send to [facility] on [address].

## 2019-08-19 NOTE — Telephone Encounter (Signed)
Refill sent to pharmacy. AS, CMA 

## 2019-08-25 ENCOUNTER — Telehealth: Payer: Self-pay | Admitting: Physician Assistant

## 2019-08-25 ENCOUNTER — Other Ambulatory Visit: Payer: Self-pay | Admitting: Family Medicine

## 2019-08-25 DIAGNOSIS — L602 Onychogryphosis: Secondary | ICD-10-CM

## 2019-08-25 DIAGNOSIS — F411 Generalized anxiety disorder: Secondary | ICD-10-CM

## 2019-08-25 NOTE — Telephone Encounter (Signed)
Per Herb Grays ok for referral. Referral has been placed. AS, CMA

## 2019-08-25 NOTE — Telephone Encounter (Signed)
[name] called states they already have scheduled appt w [facility] but a referral from pt's PCP is required before they can go.  --Per pt parent his toenails are grossly overgrown and too tough for regular clipping & they are filthyand need to be clipped by a foot doctor or trained staff.  -----Forwarding referral request to provider for Authorization to be send to [facility] .   ---glh

## 2019-08-31 ENCOUNTER — Other Ambulatory Visit: Payer: Self-pay | Admitting: Physician Assistant

## 2019-08-31 DIAGNOSIS — F411 Generalized anxiety disorder: Secondary | ICD-10-CM
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
## 2019-09-08 ENCOUNTER — Other Ambulatory Visit: Payer: Self-pay | Admitting: Family Medicine

## 2019-09-08 ENCOUNTER — Telehealth: Payer: Self-pay | Admitting: Physician Assistant

## 2019-09-08 DIAGNOSIS — K219 Gastro-esophageal reflux disease without esophagitis: Secondary | ICD-10-CM

## 2019-09-08 DIAGNOSIS — R1310 Dysphagia, unspecified: Secondary | ICD-10-CM

## 2019-09-08 DIAGNOSIS — I10 Essential (primary) hypertension: Secondary | ICD-10-CM

## 2019-09-08 MED ORDER — LOSARTAN POTASSIUM-HCTZ 50-12.5 MG PO TABS: 1.0000 | ORAL_TABLET | Freq: Every day | ORAL | 0 refills | Status: DC

## 2019-09-08 MED ORDER — FAMOTIDINE 20 MG PO TABS: 20.0000 mg | ORAL_TABLET | Freq: Every day | ORAL | 0 refills | Status: DC

## 2019-09-08 NOTE — Telephone Encounter (Signed)
Refills sent to requested pharmacy. AS, CMA 

## 2019-09-08 NOTE — Telephone Encounter (Signed)
Patient is requesting a refill of his famotidine and losartan-HCTZ, if approved please send to [facility] on [address].

## 2019-09-08 NOTE — Addendum Note (Signed)
Addended by: Mickel Crow on: 09/08/2019 03:03 PM   Modules accepted: Orders

## 2019-09-09 ENCOUNTER — Encounter: Payer: Self-pay | Admitting: Podiatry

## 2019-09-09 ENCOUNTER — Other Ambulatory Visit: Payer: Self-pay

## 2019-09-09 ENCOUNTER — Ambulatory Visit: Payer: Medicare HMO | Admitting: Podiatry

## 2019-09-09 ENCOUNTER — Ambulatory Visit: Payer: Medicare HMO | Admitting: Neurology

## 2019-09-09 ENCOUNTER — Encounter: Payer: Self-pay | Admitting: Neurology

## 2019-09-09 VITALS — BP 134/68 | HR 75 | Temp 98.0°F | Resp 16

## 2019-09-09 VITALS — BP 120/62 | HR 83 | Ht 69.0 in | Wt 176.5 lb

## 2019-09-09 DIAGNOSIS — L600 Ingrowing nail: Secondary | ICD-10-CM

## 2019-09-09 DIAGNOSIS — B351 Tinea unguium: Secondary | ICD-10-CM | POA: Diagnosis not present

## 2019-09-09 DIAGNOSIS — G40909 Epilepsy, unspecified, not intractable, without status epilepticus: Secondary | ICD-10-CM | POA: Diagnosis not present

## 2019-09-09 MED ORDER — LEVETIRACETAM 500 MG PO TABS: 500.0000 mg | ORAL_TABLET | Freq: Two times a day (BID) | ORAL | 2 refills | Status: DC

## 2019-09-09 NOTE — Progress Notes (Signed)
   Subjective:    Patient ID: Damon [name]., male    DOB: 1965/07/13, 54 y.o.   MRN: [id number]  HPI    Review of Systems  All other systems reviewed and are negative.      Objective:   Physical Exam        Assessment & Plan:

## 2019-09-09 NOTE — Patient Instructions (Signed)
We will change seizure medications: 1.  Stop Dilantin 2.  Start levetiracetam (Keppra) 500mg  twice daily 3.  Follow up in 6 months.

## 2019-09-09 NOTE — Patient Instructions (Signed)

## 2019-09-10 NOTE — Progress Notes (Signed)
Subjective:   Patient ID: Damon Lamer., male   DOB: 54 y.o.   MRN: [id number]   HPI Patient presents with mother with very thickened big toenails right over left that are hard for them to take care of and become painful.  He is not in good mental status but his mother does help and he does have hearing loss and signs with his mother.  Patient does not smoke and is not active   Review of Systems  All other systems reviewed and are negative.       Objective:  Physical Exam Vitals and nursing note reviewed.  Constitutional:      Appearance: He is well-developed.  Pulmonary:     Effort: Pulmonary effort is normal.  Musculoskeletal:        General: Normal range of motion.  Skin:    General: Skin is warm.  Neurological:     Mental Status: He is alert.     Neurovascular status was found to be intact muscle strength was found to be adequate.  Patient has diminished range of motion of the subtalar midtarsal joint bilateral and has muscle strength is moderately diminished.  He has severe deformity of the hallux nail right over left with a growing into completely abnormal fashion and digging into the underlying tissue.  Patient has good digital perfusion well oriented x3     Assessment:  Chronic nail disease right over left hallux that is painful when pressed and impossible to cut     Plan:  H&P reviewed condition.  I recommended removal of the nail or get a focus on the right first and I explained procedure to him and mother and they signed consent form after reviewing it.  Understanding risk today I infiltrated the right hallux 60 mg like Marcaine mixture sterile prep applied and using sterile instrumentation I remove the right hallux nail I exposed matrix and applied phenol 5 applications 30 seconds followed by alcohol lavage and sterile dressing.  Gave instructions for soaks and gave instructions for leaving dressing on 24 hours but take it off earlier if needed along with drops.   Patient is encouraged to call with questions

## 2019-09-14 ENCOUNTER — Other Ambulatory Visit: Payer: Self-pay

## 2019-09-14 ENCOUNTER — Other Ambulatory Visit: Payer: Medicare HMO

## 2019-09-14 ENCOUNTER — Encounter (HOSPITAL_COMMUNITY): Payer: Self-pay

## 2019-09-14 ENCOUNTER — Emergency Department (HOSPITAL_COMMUNITY)
Admission: EM | Admit: 2019-09-14 | Discharge: 2019-09-14 | Disposition: A | Discharge status: Home / Self Care | Payer: Medicare HMO | Attending: Emergency Medicine | Admitting: Emergency Medicine

## 2019-09-14 DIAGNOSIS — E785 Hyperlipidemia, unspecified: Secondary | ICD-10-CM

## 2019-09-14 DIAGNOSIS — Z5321 Procedure and treatment not carried out due to patient leaving prior to being seen by health care provider: Secondary | ICD-10-CM | POA: Insufficient documentation

## 2019-09-14 DIAGNOSIS — R030 Elevated blood-pressure reading, without diagnosis of hypertension: Secondary | ICD-10-CM

## 2019-09-14 DIAGNOSIS — R569 Unspecified convulsions: Secondary | ICD-10-CM | POA: Diagnosis not present

## 2019-09-14 LAB — CBC
HCT: 39.4 % (ref 39.0–52.0)
Hemoglobin: 12.3 g/dL — ABNORMAL LOW (ref 13.0–17.0)
MCH: 29.1 pg (ref 26.0–34.0)
MCHC: 31.2 g/dL (ref 30.0–36.0)
MCV: 93.1 fL (ref 80.0–100.0)
Platelets: 162 10*3/uL (ref 150–400)
RBC: 4.23 MIL/uL (ref 4.22–5.81)
RDW: 13 % (ref 11.5–15.5)
WBC: 5.2 10*3/uL (ref 4.0–10.5)
nRBC: 0 % (ref 0.0–0.2)

## 2019-09-14 LAB — BASIC METABOLIC PANEL
Anion gap: 10 (ref 5–15)
BUN: 10 mg/dL (ref 6–20)
CO2: 26 mmol/L (ref 22–32)
Calcium: 8.9 mg/dL (ref 8.9–10.3)
Chloride: 105 mmol/L (ref 98–111)
Creatinine, Ser: 1.04 mg/dL (ref 0.61–1.24)
GFR calc Af Amer: >60 mL/min (ref >60)
GFR calc non Af Amer: >60 mL/min (ref >60)
Glucose, Bld: 104 mg/dL — ABNORMAL HIGH (ref 70–99)
Potassium: 3.9 mmol/L (ref 3.5–5.1)
Sodium: 141 mmol/L (ref 135–145)

## 2019-09-14 LAB — CBG MONITORING, ED: Glucose-Capillary: 98 mg/dL (ref 70–99)

## 2019-09-14 NOTE — ED Notes (Signed)
Patient and family expressed wanting to leave, stated they'd follow up with neurology in the morning. PIV removed and patient left with family.

## 2019-09-14 NOTE — ED Triage Notes (Signed)
Pt from [facility] for witnessed seizure while in the lobby. Hs of TBI and seizures, recently switched him from dilantin to keppra. Pt deaf but now alert and oriented, 18G LAC. VSS

## 2019-09-14 NOTE — Progress Notes (Signed)
Pt had seizure in waiting room while waiting for venipuncture.  Orders cancelled and pt was transported to ED by EMS.  Charyl Bigger, CMA

## 2019-09-14 NOTE — Addendum Note (Signed)
Addended by: Fonnie Mu on: 09/14/2019 09:57 AM   Modules accepted: Orders

## 2019-09-15 ENCOUNTER — Telehealth: Payer: Self-pay | Admitting: Neurology

## 2019-09-15 ENCOUNTER — Other Ambulatory Visit: Payer: Self-pay | Admitting: Neurology

## 2019-09-15 DIAGNOSIS — G40909 Epilepsy, unspecified, not intractable, without status epilepticus: Secondary | ICD-10-CM

## 2019-09-15 MED ORDER — LEVETIRACETAM 750 MG PO TABS: 750.0000 mg | ORAL_TABLET | Freq: Two times a day (BID) | ORAL | 1 refills | Status: DC

## 2019-09-15 NOTE — Telephone Encounter (Signed)
Spoke with pts mom, inst her to increase to Keppra 750 mg BID, new script sent to pharmacy. Also inst that pt will be scheduled for 1 hour EEG, she will be contacted for appointment time. She verbalized understanding of all information.

## 2019-09-15 NOTE — Telephone Encounter (Signed)
Before we can do an ambulatory EEG, we will have to perform a routine EEG first.

## 2019-09-15 NOTE — Telephone Encounter (Signed)
Pt c/o: seizure Spoke with pts mother this AM. Missed medications?  No. Sleep deprived?  No. Alcohol intake?  No. Back to their usual baseline self?  Yes.  . Current medications prescribed by [name] Discontinued dilantin on 09/10/2019 and started keppra 500 mg BID  Seizure lasted less than a minute, not grand mal but was still pretty bad, returned to baseline quickly, was sent to ED but left before he was seen. Inst pts mother that Dr Tomi Likens would be notified and would call her back with updates, she verbalized understanding.

## 2019-09-15 NOTE — Telephone Encounter (Signed)
Patients mother states that patient had a seizure yesterday. They were in the PCP office when he had seizure. They waited in ER for 10+ hours, wasn't seen and decided to leave. Please call.

## 2019-09-15 NOTE — Telephone Encounter (Signed)
I would like to increase Keppra to 750mg  twice daily and check a 48 hour ambulatory EEG

## 2019-09-16 ENCOUNTER — Other Ambulatory Visit: Payer: Medicare HMO

## 2019-09-16 ENCOUNTER — Other Ambulatory Visit: Payer: Self-pay

## 2019-09-16 DIAGNOSIS — E038 Other specified hypothyroidism: Secondary | ICD-10-CM | POA: Diagnosis not present

## 2019-09-16 DIAGNOSIS — R030 Elevated blood-pressure reading, without diagnosis of hypertension: Secondary | ICD-10-CM | POA: Diagnosis not present

## 2019-09-16 DIAGNOSIS — E785 Hyperlipidemia, unspecified: Secondary | ICD-10-CM | POA: Diagnosis not present

## 2019-09-17 ENCOUNTER — Ambulatory Visit (INDEPENDENT_AMBULATORY_CARE_PROVIDER_SITE_OTHER): Payer: Medicare HMO | Admitting: Physician Assistant

## 2019-09-17 ENCOUNTER — Encounter: Payer: Self-pay | Admitting: Physician Assistant

## 2019-09-17 VITALS — BP 132/78 | HR 66 | Temp 97.8°F | Ht 69.0 in | Wt 149.7 lb

## 2019-09-17 DIAGNOSIS — E785 Hyperlipidemia, unspecified: Secondary | ICD-10-CM

## 2019-09-17 DIAGNOSIS — K219 Gastro-esophageal reflux disease without esophagitis: Secondary | ICD-10-CM

## 2019-09-17 DIAGNOSIS — R1310 Dysphagia, unspecified: Secondary | ICD-10-CM

## 2019-09-17 DIAGNOSIS — I10 Essential (primary) hypertension: Secondary | ICD-10-CM | POA: Diagnosis not present

## 2019-09-17 DIAGNOSIS — R002 Palpitations: Secondary | ICD-10-CM | POA: Diagnosis not present

## 2019-09-17 DIAGNOSIS — E038 Other specified hypothyroidism: Secondary | ICD-10-CM | POA: Diagnosis not present

## 2019-09-17 DIAGNOSIS — G40909 Epilepsy, unspecified, not intractable, without status epilepticus: Secondary | ICD-10-CM | POA: Diagnosis not present

## 2019-09-17 LAB — COMPREHENSIVE METABOLIC PANEL
ALT: 20 IU/L (ref 0–44)
AST: 23 IU/L (ref 0–40)
Albumin/Globulin Ratio: 1.8 (ref 1.2–2.2)
Albumin: 4.3 g/dL (ref 3.8–4.9)
Alkaline Phosphatase: 95 IU/L (ref 48–121)
BUN/Creatinine Ratio: 10 (ref 9–20)
BUN: 12 mg/dL (ref 6–24)
Bilirubin Total: 0.4 mg/dL (ref 0.0–1.2)
CO2: 26 mmol/L (ref 20–29)
Calcium: 9 mg/dL (ref 8.7–10.2)
Chloride: 103 mmol/L (ref 96–106)
Creatinine, Ser: 1.16 mg/dL (ref 0.76–1.27)
GFR calc Af Amer: 82 mL/min/{1.73_m2} (ref >59)
GFR calc non Af Amer: 71 mL/min/{1.73_m2} (ref >59)
Globulin, Total: 2.4 g/dL (ref 1.5–4.5)
Glucose: 85 mg/dL (ref 65–99)
Potassium: 3.5 mmol/L (ref 3.5–5.2)
Sodium: 143 mmol/L (ref 134–144)
Total Protein: 6.7 g/dL (ref 6.0–8.5)

## 2019-09-17 LAB — LIPID PANEL
Chol/HDL Ratio: 2.9 ratio (ref 0.0–5.0)
Cholesterol, Total: 129 mg/dL (ref 100–199)
HDL: 45 mg/dL (ref >39)
LDL Chol Calc (NIH): 71 mg/dL (ref 0–99)
Triglycerides: 58 mg/dL (ref 0–149)
VLDL Cholesterol Cal: 13 mg/dL (ref 5–40)

## 2019-09-17 MED ORDER — FAMOTIDINE 20 MG PO TABS: 20.0000 mg | ORAL_TABLET | Freq: Every day | ORAL | 2 refills | Status: DC

## 2019-09-17 NOTE — Patient Instructions (Signed)
DASH Eating Plan DASH stands for "Dietary Approaches to Stop Hypertension." The DASH eating plan is a healthy eating plan that has been shown to reduce high blood pressure (hypertension). It may also reduce your risk for type 2 diabetes, heart disease, and stroke. The DASH eating plan may also help with weight loss. What are tips for following this plan?  General guidelines  Avoid eating more than 2,300 mg (milligrams) of salt (sodium) a day. If you have hypertension, you may need to reduce your sodium intake to 1,500 mg a day.  Limit alcohol intake to no more than 1 drink a day for nonpregnant women and 2 drinks a day for men. One drink equals 12 oz of beer, 5 oz of wine, or 1 oz of hard liquor.  Work with your health care provider to maintain a healthy body weight or to lose weight. Ask what an ideal weight is for you.  Get at least 30 minutes of exercise that causes your heart to beat faster (aerobic exercise) most days of the week. Activities may include walking, swimming, or biking.  Work with your health care provider or diet and nutrition specialist (dietitian) to adjust your eating plan to your individual calorie needs. Reading food labels   Check food labels for the amount of sodium per serving. Choose foods with less than 5 percent of the Daily Value of sodium. Generally, foods with less than 300 mg of sodium per serving fit into this eating plan.  To find whole grains, look for the word "whole" as the first word in the ingredient list. Shopping  Buy products labeled as "low-sodium" or "no salt added."  Buy fresh foods. Avoid canned foods and premade or frozen meals. Cooking  Avoid adding salt when cooking. Use salt-free seasonings or herbs instead of table salt or sea salt. Check with your health care provider or pharmacist before using salt substitutes.  Do not fry foods. Cook foods using healthy methods such as baking, boiling, grilling, and broiling instead.  Cook with  heart-healthy oils, such as olive, canola, soybean, or sunflower oil. Meal planning  Eat a balanced diet that includes: ? 5 or more servings of fruits and vegetables each day. At each meal, try to fill half of your plate with fruits and vegetables. ? Up to 6-8 servings of whole grains each day. ? Less than 6 oz of lean meat, poultry, or fish each day. A 3-oz serving of meat is about the same size as a deck of cards. One egg equals 1 oz. ? 2 servings of low-fat dairy each day. ? A serving of nuts, seeds, or beans 5 times each week. ? Heart-healthy fats. Healthy fats called Omega-3 fatty acids are found in foods such as flaxseeds and coldwater fish, like sardines, salmon, and mackerel.  Limit how much you eat of the following: ? Canned or prepackaged foods. ? Food that is high in trans fat, such as fried foods. ? Food that is high in saturated fat, such as fatty meat. ? Sweets, desserts, sugary drinks, and other foods with added sugar. ? Full-fat dairy products.  Do not salt foods before eating.  Try to eat at least 2 vegetarian meals each week.  Eat more home-cooked food and less restaurant, buffet, and fast food.  When eating at a restaurant, ask that your food be prepared with less salt or no salt, if possible. What foods are recommended? The items listed may not be a complete list. Talk with your dietitian about   what dietary choices are best for you. Grains Whole-grain or whole-wheat bread. Whole-grain or whole-wheat pasta. Brown rice. Oatmeal. Quinoa. Bulgur. Whole-grain and low-sodium cereals. Pita bread. Low-fat, low-sodium crackers. Whole-wheat flour tortillas. Vegetables Fresh or frozen vegetables (raw, steamed, roasted, or grilled). Low-sodium or reduced-sodium tomato and vegetable juice. Low-sodium or reduced-sodium tomato sauce and tomato paste. Low-sodium or reduced-sodium canned vegetables. Fruits All fresh, dried, or frozen fruit. Canned fruit in natural juice (without  added sugar). Meat and other protein foods Skinless chicken or turkey. Ground chicken or turkey. Pork with fat trimmed off. Fish and seafood. Egg whites. Dried beans, peas, or lentils. Unsalted nuts, nut butters, and seeds. Unsalted canned beans. Lean cuts of beef with fat trimmed off. Low-sodium, lean deli meat. Dairy Low-fat (1%) or fat-free (skim) milk. Fat-free, low-fat, or reduced-fat cheeses. Nonfat, low-sodium ricotta or cottage cheese. Low-fat or nonfat yogurt. Low-fat, low-sodium cheese. Fats and oils Soft margarine without trans fats. Vegetable oil. Low-fat, reduced-fat, or light mayonnaise and salad dressings (reduced-sodium). Canola, safflower, olive, soybean, and sunflower oils. Avocado. Seasoning and other foods Herbs. Spices. Seasoning mixes without salt. Unsalted popcorn and pretzels. Fat-free sweets. What foods are not recommended? The items listed may not be a complete list. Talk with your dietitian about what dietary choices are best for you. Grains Baked goods made with fat, such as croissants, muffins, or some breads. Dry pasta or rice meal packs. Vegetables Creamed or fried vegetables. Vegetables in a cheese sauce. Regular canned vegetables (not low-sodium or reduced-sodium). Regular canned tomato sauce and paste (not low-sodium or reduced-sodium). Regular tomato and vegetable juice (not low-sodium or reduced-sodium). Pickles. Olives. Fruits Canned fruit in a light or heavy syrup. Fried fruit. Fruit in cream or butter sauce. Meat and other protein foods Fatty cuts of meat. Ribs. Fried meat. Bacon. Sausage. Bologna and other processed lunch meats. Salami. Fatback. Hotdogs. Bratwurst. Salted nuts and seeds. Canned beans with added salt. Canned or smoked fish. Whole eggs or egg yolks. Chicken or turkey with skin. Dairy Whole or 2% milk, cream, and half-and-half. Whole or full-fat cream cheese. Whole-fat or sweetened yogurt. Full-fat cheese. Nondairy creamers. Whipped toppings.  Processed cheese and cheese spreads. Fats and oils Butter. Stick margarine. Lard. Shortening. Ghee. Bacon fat. Tropical oils, such as coconut, palm kernel, or palm oil. Seasoning and other foods Salted popcorn and pretzels. Onion salt, garlic salt, seasoned salt, table salt, and sea salt. Worcestershire sauce. Tartar sauce. Barbecue sauce. Teriyaki sauce. Soy sauce, including reduced-sodium. Steak sauce. Canned and packaged gravies. Fish sauce. Oyster sauce. Cocktail sauce. Horseradish that you find on the shelf. Ketchup. Mustard. Meat flavorings and tenderizers. Bouillon cubes. Hot sauce and Tabasco sauce. Premade or packaged marinades. Premade or packaged taco seasonings. Relishes. Regular salad dressings. Where to find more information:  National Heart, Lung, and Blood Institute: www.nhlbi.nih.gov  American Heart Association: www.heart.org Summary  The DASH eating plan is a healthy eating plan that has been shown to reduce high blood pressure (hypertension). It may also reduce your risk for type 2 diabetes, heart disease, and stroke.  With the DASH eating plan, you should limit salt (sodium) intake to 2,300 mg a day. If you have hypertension, you may need to reduce your sodium intake to 1,500 mg a day.  When on the DASH eating plan, aim to eat more fresh fruits and vegetables, whole grains, lean proteins, low-fat dairy, and heart-healthy fats.  Work with your health care provider or diet and nutrition specialist (dietitian) to adjust your eating plan to your   individual calorie needs. This information is not intended to replace advice given to you by your health care provider. Make sure you discuss any questions you have with your health care provider. Document Revised: 01/31/2017 Document Reviewed: 02/12/2016 Elsevier Patient Education  2020 [employer]  
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
## 2019-09-20 LAB — SPECIMEN STATUS REPORT

## 2019-09-20 LAB — TSH: TSH: 2.8 u[IU]/mL (ref 0.450–4.500)

## 2019-09-29 ENCOUNTER — Ambulatory Visit (INDEPENDENT_AMBULATORY_CARE_PROVIDER_SITE_OTHER): Payer: Medicare HMO | Admitting: Neurology

## 2019-09-29 ENCOUNTER — Telehealth: Payer: Self-pay | Admitting: Neurology

## 2019-09-29 ENCOUNTER — Other Ambulatory Visit: Payer: Self-pay

## 2019-09-29 DIAGNOSIS — G40909 Epilepsy, unspecified, not intractable, without status epilepticus: Secondary | ICD-10-CM

## 2019-09-29 NOTE — Telephone Encounter (Signed)
Patient is in today for an EEG and wanted to get [name] a message. The patient had a light seizure on Friday 09/24/19 since changing his medicine.

## 2019-09-30 ENCOUNTER — Telehealth: Payer: Self-pay

## 2019-09-30 NOTE — Telephone Encounter (Signed)
I would like to change to 72 hours.  Thank you

## 2019-09-30 NOTE — Telephone Encounter (Signed)
Spoke to patients mother to inform her on EEG results. Pt's mother stated that he already has a 48 hour EEG scheduled in August. Would you like him to keep the 48 hour scheduled EEG or change it to 72 hours?

## 2019-09-30 NOTE — Procedures (Signed)
ELECTROENCEPHALOGRAM REPORT  Date of Study: 09/29/2019  Patient's Name: Damon [name]. MRN: [id number] Date of Birth: 1965-11-13  Clinical History: 54 year old male with history of tonic-clonic seizures presents today for evaluation of recurrent transient diffuse weakness.  Medications: KEPPRA 750mg  BID TYLENOL 500 MG tablet AFLURIA QUADRIVALENT 0.5 ML injection ASTELIN 0.1 % nasal spray [employer] VIT D 5000 HIGH-POTENCY PO PEPCID 20 MG tablet PROZAC 20 MG capsule XYZAL 5 MG tablet SYNTHROID 25 MCG tablet HYZAAR 50-12.5 MG tablet PROTONIX 20 MG tablet CRESTOR 20 MG tablet SYNTHROID 75 MCG tablet DRISDOL 1.25 MG (50000 UT) CAPS   Technical Summary: A multichannel digital EEG recording measured by the international 10-20 system with electrodes applied with paste and impedances below 5000 ohms performed in our laboratory with EKG monitoring in an awake and drowsy patient.  Hyperventilation not performed as patient wearing mask due to COVID 19 pandemic.  Photic stimulation was performed.  The digital EEG was referentially recorded, reformatted, and digitally filtered in a variety of bipolar and referential montages for optimal display.    Description: The patient is awake and drowsy during the recording.  During maximal wakefulness, there is a symmetric, medium voltage 9 Hz posterior dominant rhythm that attenuates with eye opening.  The record is symmetric.  During drowsiness and sleep, there is an increase in theta slowing of the background.  Vertex waves and symmetric sleep spindles were seen.  Photic stimulation did not elicit any abnormalities.  There were no epileptiform discharges or electrographic seizures seen.    EKG lead was unremarkable.  Impression: This awake and drowsy EEG is normal.    Clinical Correlation: A normal EEG does not exclude a clinical diagnosis of epilepsy.  If further clinical questions remain, prolonged EEG may be helpful.  Clinical correlation is  advised.   Metta Clines, DO

## 2019-10-04 NOTE — Telephone Encounter (Signed)
Called and spoke to pt's mother [name] and informed her that [name] would like to switch pt's EEG to 72 hours. Informed pt's mother that Manuela Schwartz is out on vacation and she will contact patient once she returns. Pt's mother is aware that the scheduled 48 hour EEG might not be able to change to 72 hour and that it depends on Susan's schedule.

## 2019-10-15 ENCOUNTER — Telehealth: Payer: Self-pay | Admitting: *Deleted

## 2019-10-15 NOTE — Telephone Encounter (Signed)
LMOM to call me back. We can add another day to the one already scheduled for 8/23-8/25 and have him return 8/26 instead or we can reschedule and do over a weekend if suits them better. Call me back to let me know. I left him on the schedule and added 1 day but I can change it easily after you call me back.

## 2019-10-25 ENCOUNTER — Other Ambulatory Visit: Payer: Self-pay

## 2019-10-25 ENCOUNTER — Ambulatory Visit (INDEPENDENT_AMBULATORY_CARE_PROVIDER_SITE_OTHER): Payer: Medicare HMO | Admitting: Neurology

## 2019-10-25 DIAGNOSIS — G40909 Epilepsy, unspecified, not intractable, without status epilepticus: Secondary | ICD-10-CM | POA: Diagnosis not present

## 2019-10-28 MED ORDER — LEVETIRACETAM 1000 MG PO TABS
1000.0000 mg | ORAL_TABLET | Freq: Two times a day (BID) | ORAL | 1 refills | Status: DC
Start: 2019-10-28 — End: 2020-05-02

## 2019-10-28 NOTE — Progress Notes (Addendum)
ELECTROENCEPHALOGRAM REPORT  Dates of Recording: 10/25/2019 at 9:04 AM to 10/28/2019 at 8:28 AM  Patient's Name: [facility]. MRN: [id number] Date of Birth: 08/16/1965  Procedure: 70-hour ambulatory EEG  History: 54 year old male with history of generalized tonic clonic seizures presents with recurrent spells.  Medications: Keppra, Prozac, Synthroid, Xyzal  Technical Summary: This is a 70-hour multichannel digital EEG recording measured by the international 10-20 system with electrodes applied with paste and impedances below 5000 ohms performed as portable with EKG monitoring.  The digital EEG was referentially recorded, reformatted, and digitally filtered in a variety of bipolar and referential montages for optimal display.    DESCRIPTION OF RECORDING: During maximal wakefulness, the background activity consisted of a symmetric 9Hz  posterior dominant rhythm which was reactive to eye opening.  There were no epileptiform discharges or focal slowing seen in wakefulness.  During the recording, the patient progresses through wakefulness, drowsiness, and Stage 2 sleep.  Again, there were no epileptiform discharges seen.  Events:  On 10/27/2019 at 9:30 PM, patient had habitual spell.  There were no electrographic seizures seen.  EKG lead was unremarkable.  IMPRESSION: This 70-hour ambulatory EEG study is normal.    CLINICAL CORRELATION: A normal EEG does not exclude a clinical diagnosis of epilepsy.  If further clinical questions remain, inpatient video EEG monitoring may be helpful.   Metta Clines, DO

## 2019-10-30 ENCOUNTER — Emergency Department (HOSPITAL_COMMUNITY): Payer: Medicare HMO

## 2019-10-30 ENCOUNTER — Emergency Department (HOSPITAL_COMMUNITY)
Admission: EM | Admit: 2019-10-30 | Discharge: 2019-10-30 | Disposition: A | Discharge status: Home / Self Care | Payer: Medicare HMO | Attending: Emergency Medicine | Admitting: Emergency Medicine

## 2019-10-30 ENCOUNTER — Encounter (HOSPITAL_COMMUNITY): Payer: Self-pay

## 2019-10-30 ENCOUNTER — Other Ambulatory Visit: Payer: Self-pay

## 2019-10-30 DIAGNOSIS — R519 Headache, unspecified: Secondary | ICD-10-CM | POA: Diagnosis not present

## 2019-10-30 DIAGNOSIS — R5383 Other fatigue: Secondary | ICD-10-CM | POA: Diagnosis not present

## 2019-10-30 DIAGNOSIS — K625 Hemorrhage of anus and rectum: Secondary | ICD-10-CM | POA: Diagnosis not present

## 2019-10-30 DIAGNOSIS — R197 Diarrhea, unspecified: Secondary | ICD-10-CM | POA: Diagnosis not present

## 2019-10-30 DIAGNOSIS — R58 Hemorrhage, not elsewhere classified: Secondary | ICD-10-CM | POA: Diagnosis not present

## 2019-10-30 DIAGNOSIS — E079 Disorder of thyroid, unspecified: Secondary | ICD-10-CM | POA: Insufficient documentation

## 2019-10-30 DIAGNOSIS — R531 Weakness: Secondary | ICD-10-CM | POA: Diagnosis not present

## 2019-10-30 DIAGNOSIS — I10 Essential (primary) hypertension: Secondary | ICD-10-CM | POA: Diagnosis not present

## 2019-10-30 DIAGNOSIS — I959 Hypotension, unspecified: Secondary | ICD-10-CM | POA: Diagnosis not present

## 2019-10-30 DIAGNOSIS — N179 Acute kidney failure, unspecified: Secondary | ICD-10-CM | POA: Diagnosis not present

## 2019-10-30 DIAGNOSIS — R05 Cough: Secondary | ICD-10-CM | POA: Diagnosis not present

## 2019-10-30 LAB — URINALYSIS, ROUTINE W REFLEX MICROSCOPIC
Bilirubin Urine: NEGATIVE
Glucose, UA: NEGATIVE mg/dL
Hgb urine dipstick: NEGATIVE
Ketones, ur: NEGATIVE mg/dL
Leukocytes,Ua: NEGATIVE
Nitrite: NEGATIVE
Protein, ur: NEGATIVE mg/dL
Specific Gravity, Urine: 1.006 (ref 1.005–1.030)
pH: 5 (ref 5.0–8.0)

## 2019-10-30 LAB — PROTIME-INR
INR: 1 (ref 0.8–1.2)
Prothrombin Time: 12.9 seconds (ref 11.4–15.2)

## 2019-10-30 LAB — CBC WITH DIFFERENTIAL/PLATELET
Abs Immature Granulocytes: 0.03 10*3/uL (ref 0.00–0.07)
Basophils Absolute: 0.1 10*3/uL (ref 0.0–0.1)
Basophils Relative: 1 %
Eosinophils Absolute: 0.1 10*3/uL (ref 0.0–0.5)
Eosinophils Relative: 2 %
HCT: 38.2 % — ABNORMAL LOW (ref 39.0–52.0)
Hemoglobin: 12.3 g/dL — ABNORMAL LOW (ref 13.0–17.0)
Immature Granulocytes: 0 %
Lymphocytes Relative: 19 %
Lymphs Abs: 1.8 10*3/uL (ref 0.7–4.0)
MCH: 28.9 pg (ref 26.0–34.0)
MCHC: 32.2 g/dL (ref 30.0–36.0)
MCV: 89.7 fL (ref 80.0–100.0)
Monocytes Absolute: 0.8 10*3/uL (ref 0.1–1.0)
Monocytes Relative: 9 %
Neutro Abs: 6.8 10*3/uL (ref 1.7–7.7)
Neutrophils Relative %: 69 %
Platelets: 216 10*3/uL (ref 150–400)
RBC: 4.26 MIL/uL (ref 4.22–5.81)
RDW: 12.9 % (ref 11.5–15.5)
WBC: 9.6 10*3/uL (ref 4.0–10.5)
nRBC: 0 % (ref 0.0–0.2)

## 2019-10-30 LAB — COMPREHENSIVE METABOLIC PANEL
ALT: 20 U/L (ref 0–44)
AST: 20 U/L (ref 15–41)
Albumin: 3.8 g/dL (ref 3.5–5.0)
Alkaline Phosphatase: 62 U/L (ref 38–126)
Anion gap: 10 (ref 5–15)
BUN: 24 mg/dL — ABNORMAL HIGH (ref 6–20)
CO2: 27 mmol/L (ref 22–32)
Calcium: 8.4 mg/dL — ABNORMAL LOW (ref 8.9–10.3)
Chloride: 100 mmol/L (ref 98–111)
Creatinine, Ser: 2.1 mg/dL — ABNORMAL HIGH (ref 0.61–1.24)
GFR calc Af Amer: 40 mL/min — ABNORMAL LOW (ref >60)
GFR calc non Af Amer: 34 mL/min — ABNORMAL LOW (ref >60)
Glucose, Bld: 99 mg/dL (ref 70–99)
Potassium: 3.4 mmol/L — ABNORMAL LOW (ref 3.5–5.1)
Sodium: 137 mmol/L (ref 135–145)
Total Bilirubin: 0.4 mg/dL (ref 0.3–1.2)
Total Protein: 6.7 g/dL (ref 6.5–8.1)

## 2019-10-30 LAB — POC OCCULT BLOOD, ED: Fecal Occult Bld: NEGATIVE

## 2019-10-30 LAB — CBG MONITORING, ED: Glucose-Capillary: 93 mg/dL (ref 70–99)

## 2019-10-30 MED ORDER — POTASSIUM CHLORIDE CRYS ER 20 MEQ PO TBCR
40.0000 meq | EXTENDED_RELEASE_TABLET | Freq: Once | ORAL | Status: AC
  Administered 2019-10-30: 40 meq via ORAL
  Filled 2019-10-30: qty 2

## 2019-10-30 MED ORDER — SODIUM CHLORIDE 0.9 % IV BOLUS
1000.0000 mL | Freq: Once | INTRAVENOUS | Status: AC
  Administered 2019-10-30: 1000 mL via INTRAVENOUS

## 2019-10-30 MED ORDER — LACTATED RINGERS IV BOLUS
1000.0000 mL | Freq: Once | INTRAVENOUS | Status: AC
  Administered 2019-10-30: 1000 mL via INTRAVENOUS

## 2019-10-30 NOTE — Discharge Instructions (Signed)
Your kidney test today was elevated, indicating decreased function.  Most commonly this is from dehydration and you need to drink more fluids, especially water over soda.  If you develop fever, vomiting, dizziness or weakness, or any other new/concerning symptoms then return to the ER for evaluation.  For now, stop taking your blood pressure medicine and follow-up with your doctor this week to get your blood pressure rechecked and your kidney function rechecked.

## 2019-10-30 NOTE — ED Triage Notes (Signed)
Pt BIB EMS from [facility]. Pt reports going to UC due to increased weakness and blood in stool. Pt has hx of hemorrhoids. A&O x4.  BP initially 70s palpated. Given 500 mL and BP increased to 90s palpated. 18G LAC
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
## 2019-11-01 ENCOUNTER — Telehealth: Payer: Self-pay | Admitting: Physician Assistant

## 2019-11-01 NOTE — Telephone Encounter (Signed)
[name] called (DPR on file), patient was seen at ED over the weekend and she has some clinical f/u questions from that visit she wants to discuss. Please contact [name] at [phone].

## 2019-11-01 NOTE — Telephone Encounter (Signed)
Spoke with Mrs. Solomon who gave me a run down of what happened in ED.   She is requesting to schedule apt for patient to follow up with [facility].   Please contact patient to schedule Hospital Follow Up. AS< CMA

## 2019-11-03 ENCOUNTER — Telehealth: Payer: Self-pay | Admitting: Physician Assistant

## 2019-11-03 MED ORDER — LEVOTHYROXINE SODIUM 75 MCG PO TABS
75.0000 ug | ORAL_TABLET | Freq: Every day | ORAL | 0 refills | Status: DC
Start: 2019-11-03 — End: 2020-02-08

## 2019-11-03 NOTE — Addendum Note (Signed)
Addended by: Mickel Crow on: 11/03/2019 01:48 PM   Modules accepted: Orders

## 2019-11-03 NOTE — Telephone Encounter (Signed)
Refill sent to requested pharmacy. AS, CMA 

## 2019-11-03 NOTE — Telephone Encounter (Signed)
Patient is requesting a refill of his thyroid med, if approved please send to [facility] on [address].

## 2019-11-15 DIAGNOSIS — H2513 Age-related nuclear cataract, bilateral: Secondary | ICD-10-CM | POA: Diagnosis not present

## 2019-11-15 DIAGNOSIS — H40013 Open angle with borderline findings, low risk, bilateral: Secondary | ICD-10-CM | POA: Diagnosis not present

## 2019-11-17 ENCOUNTER — Other Ambulatory Visit: Payer: Self-pay

## 2019-11-17 ENCOUNTER — Encounter (HOSPITAL_COMMUNITY): Payer: Self-pay

## 2019-11-17 ENCOUNTER — Emergency Department (HOSPITAL_COMMUNITY)
Admission: EM | Admit: 2019-11-17 | Discharge: 2019-11-17 | Disposition: A | Discharge status: Home / Self Care | Payer: Medicare HMO | Attending: Emergency Medicine | Admitting: Emergency Medicine

## 2019-11-17 DIAGNOSIS — R569 Unspecified convulsions: Secondary | ICD-10-CM | POA: Diagnosis not present

## 2019-11-17 DIAGNOSIS — Z5321 Procedure and treatment not carried out due to patient leaving prior to being seen by health care provider: Secondary | ICD-10-CM | POA: Diagnosis not present

## 2019-11-17 LAB — URINALYSIS, ROUTINE W REFLEX MICROSCOPIC
Bilirubin Urine: NEGATIVE
Glucose, UA: NEGATIVE mg/dL
Ketones, ur: NEGATIVE mg/dL
Leukocytes,Ua: NEGATIVE
Nitrite: NEGATIVE
Protein, ur: NEGATIVE mg/dL
Specific Gravity, Urine: 1.01 (ref 1.005–1.030)
pH: 5 (ref 5.0–8.0)

## 2019-11-17 LAB — COMPREHENSIVE METABOLIC PANEL
ALT: 22 U/L (ref 0–44)
AST: 20 U/L (ref 15–41)
Albumin: 3.9 g/dL (ref 3.5–5.0)
Alkaline Phosphatase: 65 U/L (ref 38–126)
Anion gap: 8 (ref 5–15)
BUN: 13 mg/dL (ref 6–20)
CO2: 29 mmol/L (ref 22–32)
Calcium: 8.9 mg/dL (ref 8.9–10.3)
Chloride: 105 mmol/L (ref 98–111)
Creatinine, Ser: 1.26 mg/dL — ABNORMAL HIGH (ref 0.61–1.24)
GFR calc Af Amer: >60 mL/min (ref >60)
GFR calc non Af Amer: >60 mL/min (ref >60)
Glucose, Bld: 89 mg/dL (ref 70–99)
Potassium: 4 mmol/L (ref 3.5–5.1)
Sodium: 142 mmol/L (ref 135–145)
Total Bilirubin: 0.5 mg/dL (ref 0.3–1.2)
Total Protein: 7 g/dL (ref 6.5–8.1)

## 2019-11-17 LAB — CBC
HCT: 37.5 % — ABNORMAL LOW (ref 39.0–52.0)
Hemoglobin: 12 g/dL — ABNORMAL LOW (ref 13.0–17.0)
MCH: 28.6 pg (ref 26.0–34.0)
MCHC: 32 g/dL (ref 30.0–36.0)
MCV: 89.3 fL (ref 80.0–100.0)
Platelets: 152 10*3/uL (ref 150–400)
RBC: 4.2 MIL/uL — ABNORMAL LOW (ref 4.22–5.81)
RDW: 12.6 % (ref 11.5–15.5)
WBC: 5.7 10*3/uL (ref 4.0–10.5)
nRBC: 0 % (ref 0.0–0.2)

## 2019-11-17 NOTE — ED Triage Notes (Signed)
Pt seen 10/30/19 for AKI and increased seizures. Seizure medication increased at that time. Pt SO report seizure this morning. Not able to get appt with PCP until sept 29th, so she returned to ER to make sure AKI is not worsening.

## 2019-11-20 ENCOUNTER — Other Ambulatory Visit: Payer: Self-pay | Admitting: Physician Assistant

## 2019-11-20 DIAGNOSIS — E785 Hyperlipidemia, unspecified: Secondary | ICD-10-CM

## 2019-11-20 DIAGNOSIS — E7841 Elevated Lipoprotein(a): Secondary | ICD-10-CM

## 2019-12-01 ENCOUNTER — Other Ambulatory Visit: Payer: Self-pay

## 2019-12-01 ENCOUNTER — Ambulatory Visit (INDEPENDENT_AMBULATORY_CARE_PROVIDER_SITE_OTHER): Payer: Medicare HMO | Admitting: Physician Assistant

## 2019-12-01 ENCOUNTER — Encounter: Payer: Self-pay | Admitting: Physician Assistant

## 2019-12-01 VITALS — BP 132/73 | HR 71 | Ht 69.0 in | Wt 148.4 lb

## 2019-12-01 DIAGNOSIS — E785 Hyperlipidemia, unspecified: Secondary | ICD-10-CM | POA: Diagnosis not present

## 2019-12-01 DIAGNOSIS — I10 Essential (primary) hypertension: Secondary | ICD-10-CM | POA: Diagnosis not present

## 2019-12-01 DIAGNOSIS — G40909 Epilepsy, unspecified, not intractable, without status epilepticus: Secondary | ICD-10-CM | POA: Diagnosis not present

## 2019-12-01 DIAGNOSIS — R5383 Other fatigue: Secondary | ICD-10-CM
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
## 2019-12-02 LAB — COMPREHENSIVE METABOLIC PANEL
ALT: 16 IU/L (ref 0–44)
AST: 18 IU/L (ref 0–40)
Albumin/Globulin Ratio: 1.6 (ref 1.2–2.2)
Albumin: 4.2 g/dL (ref 3.8–4.9)
Alkaline Phosphatase: 82 IU/L (ref 44–121)
BUN/Creatinine Ratio: 8 — ABNORMAL LOW (ref 9–20)
BUN: 10 mg/dL (ref 6–24)
Bilirubin Total: 0.5 mg/dL (ref 0.0–1.2)
CO2: 25 mmol/L (ref 20–29)
Calcium: 9.2 mg/dL (ref 8.7–10.2)
Chloride: 106 mmol/L (ref 96–106)
Creatinine, Ser: 1.23 mg/dL (ref 0.76–1.27)
GFR calc Af Amer: 76 mL/min/{1.73_m2} (ref >59)
GFR calc non Af Amer: 66 mL/min/{1.73_m2} (ref >59)
Globulin, Total: 2.6 g/dL (ref 1.5–4.5)
Glucose: 92 mg/dL (ref 65–99)
Potassium: 4.2 mmol/L (ref 3.5–5.2)
Sodium: 142 mmol/L (ref 134–144)
Total Protein: 6.8 g/dL (ref 6.0–8.5)

## 2019-12-02 LAB — CBC
Hematocrit: 37.1 % — ABNORMAL LOW (ref 37.5–51.0)
Hemoglobin: 12.1 g/dL — ABNORMAL LOW (ref 13.0–17.7)
MCH: 28.2 pg (ref 26.6–33.0)
MCHC: 32.6 g/dL (ref 31.5–35.7)
MCV: 87 fL (ref 79–97)
Platelets: 165 10*3/uL (ref 150–450)
RBC: 4.29 x10E6/uL (ref 4.14–5.80)
RDW: 12.5 % (ref 11.6–15.4)
WBC: 6 10*3/uL (ref 3.4–10.8)

## 2020-01-11 ENCOUNTER — Encounter: Payer: Self-pay | Admitting: Physician Assistant

## 2020-01-11 ENCOUNTER — Other Ambulatory Visit: Payer: Self-pay | Admitting: Physician Assistant

## 2020-01-11 ENCOUNTER — Ambulatory Visit (INDEPENDENT_AMBULATORY_CARE_PROVIDER_SITE_OTHER): Payer: Medicare HMO | Admitting: Physician Assistant

## 2020-01-11 ENCOUNTER — Other Ambulatory Visit: Payer: Self-pay | Admitting: Family Medicine

## 2020-01-11 ENCOUNTER — Other Ambulatory Visit: Payer: Self-pay

## 2020-01-11 VITALS — BP 123/74 | HR 80 | Temp 97.9°F | Ht 69.0 in | Wt 144.2 lb

## 2020-01-11 DIAGNOSIS — J45909 Unspecified asthma, uncomplicated: Secondary | ICD-10-CM | POA: Diagnosis not present

## 2020-01-11 DIAGNOSIS — I10 Essential (primary) hypertension: Secondary | ICD-10-CM

## 2020-01-11 DIAGNOSIS — K219 Gastro-esophageal reflux disease without esophagitis: Secondary | ICD-10-CM | POA: Diagnosis not present

## 2020-01-11 DIAGNOSIS — E785 Hyperlipidemia, unspecified: Secondary | ICD-10-CM

## 2020-01-11 DIAGNOSIS — E559 Vitamin D deficiency, unspecified: Secondary | ICD-10-CM

## 2020-01-11 MED ORDER — ALBUTEROL SULFATE HFA 108 (90 BASE) MCG/ACT IN AERS: 2.0000 | INHALATION_SPRAY | Freq: Three times a day (TID) | RESPIRATORY_TRACT | 1 refills | Status: DC | PRN

## 2020-01-11 NOTE — Assessment & Plan Note (Signed)
-  Last lipid panel wnl -Continue current medication regimen. -Follow a heart healthy diet. -Will continue to monitor.

## 2020-01-11 NOTE — Patient Instructions (Signed)

## 2020-01-11 NOTE — Assessment & Plan Note (Signed)
>>  ASSESSMENT AND PLAN FOR GERD (GASTROESOPHAGEAL REFLUX DISEASE) WRITTEN ON 01/11/2020  9:04 AM BY ABONZA, MARITZA, PA-C  -Well controlled. -Continue current medication regimen.
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
## 2020-01-11 NOTE — Assessment & Plan Note (Signed)
Well controlled. Continue current medication regimen.  

## 2020-01-25 DIAGNOSIS — H5589 Other irregular eye movements: Secondary | ICD-10-CM | POA: Diagnosis not present

## 2020-01-25 DIAGNOSIS — H6123 Impacted cerumen, bilateral: Secondary | ICD-10-CM | POA: Diagnosis not present

## 2020-01-25 DIAGNOSIS — R42 Dizziness and giddiness: Secondary | ICD-10-CM | POA: Diagnosis not present

## 2020-01-26 DIAGNOSIS — H2513 Age-related nuclear cataract, bilateral: Secondary | ICD-10-CM | POA: Diagnosis not present

## 2020-01-26 DIAGNOSIS — H5021 Vertical strabismus, right eye: Secondary | ICD-10-CM | POA: Diagnosis not present

## 2020-01-26 DIAGNOSIS — H532 Diplopia: Secondary | ICD-10-CM | POA: Diagnosis not present

## 2020-01-26 DIAGNOSIS — H40013 Open angle with borderline findings, low risk, bilateral: Secondary | ICD-10-CM | POA: Diagnosis not present

## 2020-01-31 ENCOUNTER — Other Ambulatory Visit: Payer: Self-pay | Admitting: Optometry

## 2020-01-31 ENCOUNTER — Other Ambulatory Visit (HOSPITAL_COMMUNITY): Payer: Self-pay | Admitting: Optometry

## 2020-01-31 DIAGNOSIS — H532 Diplopia: Secondary | ICD-10-CM

## 2020-01-31 DIAGNOSIS — H5021 Vertical strabismus, right eye: Secondary | ICD-10-CM

## 2020-02-08 ENCOUNTER — Telehealth: Payer: Self-pay | Admitting: Physician Assistant

## 2020-02-08 ENCOUNTER — Other Ambulatory Visit: Payer: Self-pay | Admitting: Physician Assistant

## 2020-02-08 DIAGNOSIS — E039 Hypothyroidism, unspecified: Secondary | ICD-10-CM

## 2020-02-08 MED ORDER — LEVOTHYROXINE SODIUM 25 MCG PO TABS: ORAL_TABLET | ORAL | 0 refills | Status: DC

## 2020-02-08 MED ORDER — LEVOTHYROXINE SODIUM 75 MCG PO TABS: 75.0000 ug | ORAL_TABLET | Freq: Every day | ORAL | 0 refills | Status: DC

## 2020-02-08 NOTE — Telephone Encounter (Signed)
Refill sent to pharmacy. AS< CMA 

## 2020-02-08 NOTE — Telephone Encounter (Signed)
Patient needs a refill on synthroid. [facility] on [address]

## 2020-02-08 NOTE — Addendum Note (Signed)
Addended by: Mickel Crow on: 02/08/2020 10:40 AM   Modules accepted: Orders

## 2020-02-15 ENCOUNTER — Ambulatory Visit
Admission: RE | Admit: 2020-02-15 | Discharge: 2020-02-15 | Disposition: A | Discharge status: Home / Self Care | Payer: Medicare HMO | Source: Ambulatory Visit | Attending: Optometry | Admitting: Optometry

## 2020-02-15 ENCOUNTER — Other Ambulatory Visit: Payer: Self-pay

## 2020-02-15 ENCOUNTER — Telehealth: Payer: Self-pay | Admitting: Physician Assistant

## 2020-02-15 ENCOUNTER — Other Ambulatory Visit: Payer: Self-pay | Admitting: Physician Assistant

## 2020-02-15 DIAGNOSIS — J0100 Acute maxillary sinusitis, unspecified: Secondary | ICD-10-CM | POA: Diagnosis not present

## 2020-02-15 DIAGNOSIS — R569 Unspecified convulsions: Secondary | ICD-10-CM | POA: Diagnosis not present

## 2020-02-15 DIAGNOSIS — H5021 Vertical strabismus, right eye: Secondary | ICD-10-CM | POA: Diagnosis not present

## 2020-02-15 DIAGNOSIS — E559 Vitamin D deficiency, unspecified: Secondary | ICD-10-CM

## 2020-02-15 DIAGNOSIS — J3489 Other specified disorders of nose and nasal sinuses: Secondary | ICD-10-CM | POA: Diagnosis not present

## 2020-02-15 DIAGNOSIS — E785 Hyperlipidemia, unspecified: Secondary | ICD-10-CM

## 2020-02-15 DIAGNOSIS — H532 Diplopia: Secondary | ICD-10-CM | POA: Diagnosis not present

## 2020-02-15 DIAGNOSIS — E7841 Elevated Lipoprotein(a): Secondary | ICD-10-CM

## 2020-02-15 DIAGNOSIS — J320 Chronic maxillary sinusitis: Secondary | ICD-10-CM | POA: Diagnosis not present

## 2020-02-15 MED ORDER — VITAMIN D (ERGOCALCIFEROL) 1.25 MG (50000 UNIT) PO CAPS: ORAL_CAPSULE | ORAL | 3 refills | Status: DC

## 2020-02-15 MED ORDER — IOHEXOL 300 MG/ML  SOLN
75.0000 mL | Freq: Once | INTRAMUSCULAR | Status: AC | PRN
  Administered 2020-02-15: 08:00:00 75 mL via INTRAVENOUS

## 2020-02-15 NOTE — Telephone Encounter (Signed)
Refill sent to pharmacy. AS, CMA 

## 2020-02-15 NOTE — Telephone Encounter (Signed)
Patient needs a refill on Vitamin D and uses [facility] on [address]

## 2020-02-15 NOTE — Addendum Note (Signed)
Addended by: Mickel Crow on: 02/15/2020 04:33 PM   Modules accepted: Orders

## 2020-03-13 ENCOUNTER — Ambulatory Visit: Payer: Medicare HMO | Admitting: Neurology

## 2020-03-22 DIAGNOSIS — H2513 Age-related nuclear cataract, bilateral: Secondary | ICD-10-CM | POA: Diagnosis not present

## 2020-03-22 DIAGNOSIS — H40013 Open angle with borderline findings, low risk, bilateral: Secondary | ICD-10-CM | POA: Diagnosis not present

## 2020-03-22 DIAGNOSIS — H5021 Vertical strabismus, right eye: Secondary | ICD-10-CM | POA: Diagnosis not present

## 2020-03-22 DIAGNOSIS — H04562 Stenosis of left lacrimal punctum: Secondary | ICD-10-CM | POA: Diagnosis not present

## 2020-03-22 DIAGNOSIS — H532 Diplopia: Secondary | ICD-10-CM | POA: Diagnosis not present

## 2020-03-30 ENCOUNTER — Other Ambulatory Visit: Payer: Self-pay

## 2020-03-30 ENCOUNTER — Other Ambulatory Visit: Payer: Medicare HMO

## 2020-03-30 DIAGNOSIS — Z20822 Contact with and (suspected) exposure to covid-19: Secondary | ICD-10-CM

## 2020-03-31 LAB — NOVEL CORONAVIRUS, NAA: SARS-CoV-2, NAA: DETECTED — AB

## 2020-03-31 LAB — SARS-COV-2, NAA 2 DAY TAT

## 2020-04-01 ENCOUNTER — Observation Stay (HOSPITAL_COMMUNITY): Payer: Medicare HMO

## 2020-04-01 ENCOUNTER — Emergency Department (HOSPITAL_COMMUNITY): Payer: Medicare HMO

## 2020-04-01 ENCOUNTER — Encounter (HOSPITAL_COMMUNITY): Payer: Self-pay | Admitting: Emergency Medicine

## 2020-04-01 ENCOUNTER — Inpatient Hospital Stay (HOSPITAL_COMMUNITY)
Admission: EM | Admit: 2020-04-01 | Discharge: 2020-04-04 | DRG: 682 | Disposition: A | Discharge status: Home / Self Care | Payer: Medicare HMO | Attending: Internal Medicine | Admitting: Internal Medicine

## 2020-04-01 ENCOUNTER — Other Ambulatory Visit: Payer: Self-pay

## 2020-04-01 DIAGNOSIS — R1310 Dysphagia, unspecified: Secondary | ICD-10-CM | POA: Diagnosis present

## 2020-04-01 DIAGNOSIS — K2100 Gastro-esophageal reflux disease with esophagitis, without bleeding: Secondary | ICD-10-CM | POA: Diagnosis present

## 2020-04-01 DIAGNOSIS — Z86010 Personal history of colonic polyps: Secondary | ICD-10-CM

## 2020-04-01 DIAGNOSIS — K921 Melena: Secondary | ICD-10-CM | POA: Diagnosis not present

## 2020-04-01 DIAGNOSIS — K644 Residual hemorrhoidal skin tags: Secondary | ICD-10-CM | POA: Diagnosis present

## 2020-04-01 DIAGNOSIS — K449 Diaphragmatic hernia without obstruction or gangrene: Secondary | ICD-10-CM | POA: Diagnosis present

## 2020-04-01 DIAGNOSIS — Z881 Allergy status to other antibiotic agents status: Secondary | ICD-10-CM

## 2020-04-01 DIAGNOSIS — K922 Gastrointestinal hemorrhage, unspecified: Secondary | ICD-10-CM | POA: Diagnosis not present

## 2020-04-01 DIAGNOSIS — D649 Anemia, unspecified: Secondary | ICD-10-CM | POA: Diagnosis present

## 2020-04-01 DIAGNOSIS — K649 Unspecified hemorrhoids: Secondary | ICD-10-CM

## 2020-04-01 DIAGNOSIS — R197 Diarrhea, unspecified: Secondary | ICD-10-CM | POA: Diagnosis present

## 2020-04-01 DIAGNOSIS — G40109 Localization-related (focal) (partial) symptomatic epilepsy and epileptic syndromes with simple partial seizures, not intractable, without status epilepticus: Secondary | ICD-10-CM

## 2020-04-01 DIAGNOSIS — E039 Hypothyroidism, unspecified: Secondary | ICD-10-CM | POA: Diagnosis present

## 2020-04-01 DIAGNOSIS — R0602 Shortness of breath: Secondary | ICD-10-CM | POA: Diagnosis not present

## 2020-04-01 DIAGNOSIS — E876 Hypokalemia: Secondary | ICD-10-CM | POA: Diagnosis present

## 2020-04-01 DIAGNOSIS — Z79890 Hormone replacement therapy (postmenopausal): Secondary | ICD-10-CM | POA: Diagnosis not present

## 2020-04-01 DIAGNOSIS — R634 Abnormal weight loss: Secondary | ICD-10-CM | POA: Diagnosis not present

## 2020-04-01 DIAGNOSIS — K625 Hemorrhage of anus and rectum: Secondary | ICD-10-CM | POA: Diagnosis present

## 2020-04-01 DIAGNOSIS — N281 Cyst of kidney, acquired: Secondary | ICD-10-CM | POA: Diagnosis not present

## 2020-04-01 DIAGNOSIS — Z8371 Family history of colonic polyps: Secondary | ICD-10-CM | POA: Diagnosis not present

## 2020-04-01 DIAGNOSIS — K529 Noninfective gastroenteritis and colitis, unspecified: Secondary | ICD-10-CM | POA: Diagnosis present

## 2020-04-01 DIAGNOSIS — K648 Other hemorrhoids: Secondary | ICD-10-CM | POA: Diagnosis present

## 2020-04-01 DIAGNOSIS — N179 Acute kidney failure, unspecified: Principal | ICD-10-CM

## 2020-04-01 DIAGNOSIS — Z79899 Other long term (current) drug therapy: Secondary | ICD-10-CM

## 2020-04-01 DIAGNOSIS — E559 Vitamin D deficiency, unspecified: Secondary | ICD-10-CM | POA: Diagnosis not present

## 2020-04-01 DIAGNOSIS — G40909 Epilepsy, unspecified, not intractable, without status epilepticus: Secondary | ICD-10-CM | POA: Diagnosis present

## 2020-04-01 DIAGNOSIS — H905 Unspecified sensorineural hearing loss: Secondary | ICD-10-CM | POA: Diagnosis present

## 2020-04-01 DIAGNOSIS — U071 COVID-19: Secondary | ICD-10-CM

## 2020-04-01 DIAGNOSIS — N400 Enlarged prostate without lower urinary tract symptoms: Secondary | ICD-10-CM | POA: Diagnosis present

## 2020-04-01 DIAGNOSIS — J45909 Unspecified asthma, uncomplicated: Secondary | ICD-10-CM

## 2020-04-01 DIAGNOSIS — K6389 Other specified diseases of intestine: Secondary | ICD-10-CM | POA: Diagnosis not present

## 2020-04-01 LAB — CBC WITH DIFFERENTIAL/PLATELET
Abs Immature Granulocytes: 0.02 10*3/uL (ref 0.00–0.07)
Basophils Absolute: 0 10*3/uL (ref 0.0–0.1)
Basophils Relative: 0 %
Eosinophils Absolute: 0 10*3/uL (ref 0.0–0.5)
Eosinophils Relative: 0 %
HCT: 44.9 % (ref 39.0–52.0)
Hemoglobin: 13.9 g/dL (ref 13.0–17.0)
Immature Granulocytes: 0 %
Lymphocytes Relative: 15 %
Lymphs Abs: 0.7 10*3/uL (ref 0.7–4.0)
MCH: 26.2 pg (ref 26.0–34.0)
MCHC: 31 g/dL (ref 30.0–36.0)
MCV: 84.7 fL (ref 80.0–100.0)
Monocytes Absolute: 0.4 10*3/uL (ref 0.1–1.0)
Monocytes Relative: 9 %
Neutro Abs: 3.5 10*3/uL (ref 1.7–7.7)
Neutrophils Relative %: 76 %
Platelets: 153 10*3/uL (ref 150–400)
RBC: 5.3 MIL/uL (ref 4.22–5.81)
RDW: 14.2 % (ref 11.5–15.5)
WBC: 4.7 10*3/uL (ref 4.0–10.5)
nRBC: 0 % (ref 0.0–0.2)

## 2020-04-01 LAB — URINALYSIS, ROUTINE W REFLEX MICROSCOPIC
Bilirubin Urine: NEGATIVE
Glucose, UA: NEGATIVE mg/dL
Ketones, ur: NEGATIVE mg/dL
Leukocytes,Ua: NEGATIVE
Nitrite: NEGATIVE
Protein, ur: 100 mg/dL — AB
Specific Gravity, Urine: 1.009 (ref 1.005–1.030)
pH: 5 (ref 5.0–8.0)

## 2020-04-01 LAB — COMPREHENSIVE METABOLIC PANEL
ALT: 21 U/L (ref 0–44)
AST: 23 U/L (ref 15–41)
Albumin: 4 g/dL (ref 3.5–5.0)
Alkaline Phosphatase: 64 U/L (ref 38–126)
Anion gap: 10 (ref 5–15)
BUN: 12 mg/dL (ref 6–20)
CO2: 25 mmol/L (ref 22–32)
Calcium: 9 mg/dL (ref 8.9–10.3)
Chloride: 104 mmol/L (ref 98–111)
Creatinine, Ser: 2.55 mg/dL — ABNORMAL HIGH (ref 0.61–1.24)
GFR, Estimated: 29 mL/min — ABNORMAL LOW (ref >60)
Glucose, Bld: 99 mg/dL (ref 70–99)
Potassium: 3.2 mmol/L — ABNORMAL LOW (ref 3.5–5.1)
Sodium: 139 mmol/L (ref 135–145)
Total Bilirubin: 0.6 mg/dL (ref 0.3–1.2)
Total Protein: 8 g/dL (ref 6.5–8.1)

## 2020-04-01 MED ORDER — LEVOTHYROXINE SODIUM 75 MCG PO TABS
75.0000 ug | ORAL_TABLET | ORAL | Status: DC
  Administered 2020-04-02 – 2020-04-03 (×2): 75 ug via ORAL
  Filled 2020-04-01 (×2): qty 1

## 2020-04-01 MED ORDER — LEVOTHYROXINE SODIUM 50 MCG PO TABS: 75.0000 ug | ORAL_TABLET | Freq: Every day | ORAL | Status: DC

## 2020-04-01 MED ORDER — LEVOTHYROXINE SODIUM 75 MCG PO TABS
87.5000 ug | ORAL_TABLET | ORAL | Status: DC
  Administered 2020-04-04: 87.5 ug via ORAL
  Filled 2020-04-01: qty 1

## 2020-04-01 MED ORDER — SODIUM CHLORIDE 0.9 % IV SOLN: INTRAVENOUS | Status: AC

## 2020-04-01 MED ORDER — ONDANSETRON HCL 4 MG PO TABS: 4.0000 mg | ORAL_TABLET | Freq: Four times a day (QID) | ORAL | Status: DC | PRN

## 2020-04-01 MED ORDER — POTASSIUM CHLORIDE CRYS ER 20 MEQ PO TBCR
40.0000 meq | EXTENDED_RELEASE_TABLET | Freq: Once | ORAL | Status: AC
  Administered 2020-04-01: 40 meq via ORAL
  Filled 2020-04-01: qty 2

## 2020-04-01 MED ORDER — FLUOXETINE HCL 20 MG PO CAPS
20.0000 mg | ORAL_CAPSULE | Freq: Every day | ORAL | Status: DC
  Administered 2020-04-01 – 2020-04-03 (×3): 20 mg via ORAL
  Filled 2020-04-01 (×3): qty 1

## 2020-04-01 MED ORDER — ROSUVASTATIN CALCIUM 20 MG PO TABS
20.0000 mg | ORAL_TABLET | Freq: Every day | ORAL | Status: DC
  Administered 2020-04-02 – 2020-04-04 (×3): 20 mg via ORAL
  Filled 2020-04-01 (×3): qty 1

## 2020-04-01 MED ORDER — HYDRALAZINE HCL 25 MG PO TABS: 25.0000 mg | ORAL_TABLET | Freq: Four times a day (QID) | ORAL | Status: DC | PRN

## 2020-04-01 MED ORDER — HEPARIN SODIUM (PORCINE) 5000 UNIT/ML IJ SOLN
5000.0000 [IU] | Freq: Three times a day (TID) | INTRAMUSCULAR | Status: DC
  Administered 2020-04-01 – 2020-04-02 (×2): 5000 [IU] via SUBCUTANEOUS
  Filled 2020-04-01 (×2): qty 1

## 2020-04-01 MED ORDER — ALBUTEROL SULFATE HFA 108 (90 BASE) MCG/ACT IN AERS: 2.0000 | INHALATION_SPRAY | Freq: Three times a day (TID) | RESPIRATORY_TRACT | Status: DC | PRN

## 2020-04-01 MED ORDER — AZELASTINE HCL 0.1 % NA SOLN
1.0000 | Freq: Two times a day (BID) | NASAL | Status: DC | PRN
  Filled 2020-04-01: qty 30

## 2020-04-01 MED ORDER — LEVOCETIRIZINE DIHYDROCHLORIDE 5 MG PO TABS: 5.0000 mg | ORAL_TABLET | Freq: Every evening | ORAL | Status: DC

## 2020-04-01 MED ORDER — ACETAMINOPHEN 325 MG PO TABS: 650.0000 mg | ORAL_TABLET | Freq: Four times a day (QID) | ORAL | Status: DC | PRN

## 2020-04-01 MED ORDER — HEPARIN SODIUM (PORCINE) 5000 UNIT/ML IJ SOLN: 5000.0000 [IU] | Freq: Two times a day (BID) | INTRAMUSCULAR | Status: DC

## 2020-04-01 MED ORDER — ONDANSETRON HCL 4 MG/2ML IJ SOLN: 4.0000 mg | Freq: Four times a day (QID) | INTRAMUSCULAR | Status: DC | PRN

## 2020-04-01 MED ORDER — LEVETIRACETAM 500 MG PO TABS
1000.0000 mg | ORAL_TABLET | Freq: Two times a day (BID) | ORAL | Status: DC
  Administered 2020-04-01 – 2020-04-04 (×6): 1000 mg via ORAL
  Filled 2020-04-01 (×6): qty 2

## 2020-04-01 MED ORDER — LORATADINE 10 MG PO TABS
10.0000 mg | ORAL_TABLET | Freq: Every day | ORAL | Status: DC
  Administered 2020-04-01 – 2020-04-04 (×4): 10 mg via ORAL
  Filled 2020-04-01 (×4): qty 1

## 2020-04-01 MED ORDER — PANTOPRAZOLE SODIUM 20 MG PO TBEC
20.0000 mg | DELAYED_RELEASE_TABLET | Freq: Two times a day (BID) | ORAL | Status: DC
  Administered 2020-04-01 – 2020-04-03 (×4): 20 mg via ORAL
  Filled 2020-04-01 (×4): qty 1

## 2020-04-01 MED ORDER — SODIUM CHLORIDE 0.9 % IV BOLUS
1000.0000 mL | Freq: Once | INTRAVENOUS | Status: AC
  Administered 2020-04-01: 1000 mL via INTRAVENOUS

## 2020-04-01 MED ORDER — ACETAMINOPHEN 500 MG PO TABS: 500.0000 mg | ORAL_TABLET | Freq: Four times a day (QID) | ORAL | Status: DC | PRN

## 2020-04-01 MED ORDER — FAMOTIDINE 20 MG PO TABS
20.0000 mg | ORAL_TABLET | Freq: Every day | ORAL | Status: DC
  Administered 2020-04-02 – 2020-04-04 (×3): 20 mg via ORAL
  Filled 2020-04-01 (×3): qty 1

## 2020-04-01 NOTE — Discharge Instructions (Addendum)
Your lab work today was overall reassuring. Your potassium was a little bit low, please eat potassium rich foods ( handout provided) and have this rechecked with your primary care doctor.  Please follow-up with the gastroenterologist for further evaluation of your rectal bleeding.  Please have your blood counts rechecked to make sure they are not dropping significantly.  Please return to the ER if you have severe bleeding, chest pain, low oxygen levels with your pulse oximeter.
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
## 2020-04-01 NOTE — ED Triage Notes (Signed)
Pt started with covid symptoms 6 days ago and was confirmed 2 days ago. States he has had SOB, lethargy, and coughing. Alert and oriented. Communicates with sign language.
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
## 2020-04-01 NOTE — ED Notes (Signed)
Patient just urinated prior to bladder scan.
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
## 2020-04-02 DIAGNOSIS — R197 Diarrhea, unspecified: Secondary | ICD-10-CM | POA: Diagnosis not present

## 2020-04-02 DIAGNOSIS — G40909 Epilepsy, unspecified, not intractable, without status epilepticus: Secondary | ICD-10-CM

## 2020-04-02 DIAGNOSIS — E039 Hypothyroidism, unspecified: Secondary | ICD-10-CM

## 2020-04-02 DIAGNOSIS — U071 COVID-19: Secondary | ICD-10-CM | POA: Diagnosis not present

## 2020-04-02 DIAGNOSIS — N179 Acute kidney failure, unspecified: Principal | ICD-10-CM

## 2020-04-02 LAB — CBC WITH DIFFERENTIAL/PLATELET
Abs Immature Granulocytes: 0.01 10*3/uL (ref 0.00–0.07)
Basophils Absolute: 0 10*3/uL (ref 0.0–0.1)
Basophils Relative: 0 %
Eosinophils Absolute: 0.1 10*3/uL (ref 0.0–0.5)
Eosinophils Relative: 2 %
HCT: 35.6 % — ABNORMAL LOW (ref 39.0–52.0)
Hemoglobin: 10.8 g/dL — ABNORMAL LOW (ref 13.0–17.0)
Immature Granulocytes: 0 %
Lymphocytes Relative: 23 %
Lymphs Abs: 0.7 10*3/uL (ref 0.7–4.0)
MCH: 26.3 pg (ref 26.0–34.0)
MCHC: 30.3 g/dL (ref 30.0–36.0)
MCV: 86.6 fL (ref 80.0–100.0)
Monocytes Absolute: 0.2 10*3/uL (ref 0.1–1.0)
Monocytes Relative: 7 %
Neutro Abs: 2 10*3/uL (ref 1.7–7.7)
Neutrophils Relative %: 68 %
Platelets: 101 10*3/uL — ABNORMAL LOW (ref 150–400)
RBC: 4.11 MIL/uL — ABNORMAL LOW (ref 4.22–5.81)
RDW: 14.1 % (ref 11.5–15.5)
WBC: 3 10*3/uL — ABNORMAL LOW (ref 4.0–10.5)
nRBC: 0 % (ref 0.0–0.2)

## 2020-04-02 LAB — COMPREHENSIVE METABOLIC PANEL
ALT: 13 U/L (ref 0–44)
AST: 15 U/L (ref 15–41)
Albumin: 3 g/dL — ABNORMAL LOW (ref 3.5–5.0)
Alkaline Phosphatase: 47 U/L (ref 38–126)
Anion gap: 7 (ref 5–15)
BUN: 10 mg/dL (ref 6–20)
CO2: 23 mmol/L (ref 22–32)
Calcium: 8.4 mg/dL — ABNORMAL LOW (ref 8.9–10.3)
Chloride: 113 mmol/L — ABNORMAL HIGH (ref 98–111)
Creatinine, Ser: 1.97 mg/dL — ABNORMAL HIGH (ref 0.61–1.24)
GFR, Estimated: 39 mL/min — ABNORMAL LOW (ref >60)
Glucose, Bld: 88 mg/dL (ref 70–99)
Potassium: 3.5 mmol/L (ref 3.5–5.1)
Sodium: 143 mmol/L (ref 135–145)
Total Bilirubin: 0.2 mg/dL — ABNORMAL LOW (ref 0.3–1.2)
Total Protein: 5.8 g/dL — ABNORMAL LOW (ref 6.5–8.1)

## 2020-04-02 LAB — D-DIMER, QUANTITATIVE: D-Dimer, Quant: 0.53 ug/mL-FEU — ABNORMAL HIGH (ref 0.00–0.50)

## 2020-04-02 LAB — MAGNESIUM: Magnesium: 2.1 mg/dL (ref 1.7–2.4)

## 2020-04-02 LAB — C-REACTIVE PROTEIN: CRP: 1.3 mg/dL — ABNORMAL HIGH (ref <1.0)

## 2020-04-02 LAB — FERRITIN: Ferritin: 13 ng/mL — ABNORMAL LOW (ref 24–336)

## 2020-04-02 LAB — PHOSPHORUS: Phosphorus: 2.9 mg/dL (ref 2.5–4.6)

## 2020-04-02 MED ORDER — POTASSIUM CHLORIDE CRYS ER 20 MEQ PO TBCR
40.0000 meq | EXTENDED_RELEASE_TABLET | Freq: Once | ORAL | Status: AC
  Administered 2020-04-02: 40 meq via ORAL
  Filled 2020-04-02: qty 2

## 2020-04-02 MED ORDER — PEG 3350-KCL-NA BICARB-NACL 420 G PO SOLR
4000.0000 mL | Freq: Once | ORAL | Status: AC
  Administered 2020-04-02: 4000 mL via ORAL

## 2020-04-02 NOTE — Progress Notes (Addendum)
PROGRESS NOTE  Damon [name]. GGY:[id number] DOB: 1966-03-03 DOA: 04/01/2020 PCP: [name], PA-C   LOS: 0 days   Brief narrative:  Damon [name]. is a 55 y.o. male with medical history significant of TBI, deafness at baseline, seizure disorder, hypothyroidism, presented to the  hospital with worsening weakness, bloody diarrhea and weight loss.  There was report of around 20 pound weight loss in 1 to 2 months ago with significantly decreased oral intake.  Patient was tested for Covid 2 days back which was positive.  Patient lives with his mother who also reported on-and-off bloody diarrhea for the last 2 months.  In the ED, patient was found to have AKI.  Creatinine 2.5 compared to baseline less than 1.  Chest x-ray clear, no significant infiltrates, no hypoxia.  Patient was then admitted hospital for generalized weakness acute kidney injury bloody diarrhea and COVID-19 infection.  Assessment/Plan:  Principal Problem:   AKI (acute kidney injury) ([place]) Active Problems:   Hypothyroidism   Seizure disorder ([place])   COVID-19 virus infection   Bloody diarrhea  Acute kidney injury likely secondary to poor oral intake, bloody diarrhea.  Improving creatinine levels IV hydration.  Renal ultrasound shows prostatic enlargement with cortical atrophy and medical renal disease no hydronephrosis noted.  Patient presented with creatinine of 2.5 compared to baseline less than 1.  We will continue to monitor BMP closely.  Lab Results  Component Value Date   CREATININE 1.97 (H) 04/02/2020   CREATININE 2.55 (H) 04/01/2020   CREATININE 1.23 12/01/2019    Bloody diarrhea Seen by GI Dr. Benson [place].  We will follow recommendation.  History of colonoscopy in the past.  Keep on clears today.  Keep n.p.o. after midnight.  Monitor hemoglobin.  Latest hemoglobin of 10.8.  Rectal examination showed no hemorrhoids but bright red blood.  GI is planning for endoscopic/colonoscopic evaluation.  We will continue  Protonix.  Monitor hemoglobin closely.  Transfuse for hemoglobin less than 7.  COVID-19 infection No pneumonia or hypoxia.  Likely incidental infection.  We will continue to monitor close.  Trend inflammatory markers, mildly elevated  COVID-19 Labs  Recent Labs    04/02/20 0351  DDIMER 0.53*  FERRITIN 13*  CRP 1.3*    Lab Results  Component Value Date   SARSCOV2NAA Detected (A) 03/30/2020   [place] Not Detected 11/02/2018   Hypothyroidism -Continue Synthroid  Seizure disorder -Continue Keppra  Mild hypokalemia.  Present on admission.  Has improved today.  Check levels in a.m.  DVT prophylaxis: Discontinue heparin subcu for now due to concerns for rectal bleeding.  Code Status: Full code  Family Communication:   I spoke with the patient's mother on the phone and updated her about the clinical condition of the patient and answered all the questions  Status is: Observation  The patient will require care spanning > 2 midnights and should be moved to inpatient because: IV treatments appropriate due to intensity of illness or inability to take PO, Inpatient level of care appropriate due to severity of illness and Closer monitoring of hemoglobin, IV fluids, GI intervention.  Dispo: The patient is from: Home              Anticipated d/c is to: Home              Anticipated d/c date is: 2 days              Patient currently is not medically stable to d/c.   Difficult to  place patient No   Consultants:  GI  Procedures:  None  Anti-infectives:  . None  Anti-infectives (From admission, onward)   None     Subjective:  Today, patient was seen and examined at bedside.  Patient denies any nausea vomiting or abdominal pain.  Had a bowel movement today without obvious bleeding.  Objective: Vitals:   04/02/20 0111 04/02/20 0507  BP: 137/72 129/76  Pulse: 72 76  Resp: 19 18  Temp: 98 F (36.7 C) 98.1 F (36.7 C)  SpO2: 98% 100%    Intake/Output Summary  (Last 24 hours) at 04/02/2020 0839 Last data filed at 04/02/2020 0300 Gross per 24 hour  Intake 349.78 ml  Output -  Net 349.78 ml   Filed Weights   04/01/20 1058  Weight: 63.5 kg   Body mass index is 20.67 kg/m.   Physical Exam:  GENERAL: Patient is alert awake, deaf but able to comprehend and respond.  Thinly built. HENT: No scleral pallor or icterus. Pupils equally reactive to light. Oral mucosa is moist NECK: is supple, no gross swelling noted. CHEST: Clear to auscultation. No crackles or wheezes.  Diminished breath sounds bilaterally. CVS: S1 and S2 heard, no murmur. Regular rate and rhythm.  ABDOMEN: Soft, non-tender, bowel sounds are present. EXTREMITIES: No edema. CNS: Cranial nerves are intact. No focal motor deficits. SKIN: warm and dry without rashes.  Data Review: I have personally reviewed the following laboratory data and studies,  CBC: Recent Labs  Lab 04/01/20 1304 04/02/20 0351  WBC 4.7 3.0*  NEUTROABS 3.5 2.0  HGB 13.9 10.8*  HCT 44.9 35.6*  MCV 84.7 86.6  PLT 153 696*   Basic Metabolic Panel: Recent Labs  Lab 04/01/20 1304 04/02/20 0351  NA 139 143  K 3.2* 3.5  CL 104 113*  CO2 25 23  GLUCOSE 99 88  BUN 12 10  CREATININE 2.55* 1.97*  CALCIUM 9.0 8.4*  MG  --  2.1  PHOS  --  2.9   Liver Function Tests: Recent Labs  Lab 04/01/20 1304 04/02/20 0351  AST 23 15  ALT 21 13  ALKPHOS 64 47  BILITOT 0.6 0.2*  PROT 8.0 5.8*  ALBUMIN 4.0 3.0*   No results for input(s): LIPASE, AMYLASE in the last 168 hours. No results for input(s): AMMONIA in the last 168 hours. Cardiac Enzymes: No results for input(s): CKTOTAL, CKMB, CKMBINDEX, TROPONINI in the last 168 hours. BNP (last 3 results) No results for input(s): BNP in the last 8760 hours.  ProBNP (last 3 results) No results for input(s): PROBNP in the last 8760 hours.  CBG: No results for input(s): GLUCAP in the last 168 hours. Recent Results (from the past 240 hour(s))  Novel  Coronavirus, NAA ([facility])     Status: Abnormal   Collection Time: 03/30/20 10:44 AM   Specimen: Nasopharyngeal(NP) swabs in vial transport medium   Nasopharynge  Result Value Ref Range Status   SARS-CoV-2, NAA Detected (A) Not Detected Final    Comment: Patients who have a positive COVID-19 test result may now have treatment options. Treatment options are available for patients with mild to moderate symptoms and for hospitalized patients. Visit our website at [email or web address] for resources and information. This nucleic acid amplification test was developed and its performance characteristics determined by [employer]. Nucleic acid amplification tests include RT-PCR and TMA. This test has not been FDA cleared or approved. This test has been authorized by FDA under an Emergency Use Authorization (EUA). This test is only  authorized for the duration of time the declaration that circumstances exist justifying the authorization of the emergency use of in vitro diagnostic tests for detection of SARS-CoV-2 virus and/or diagnosis of COVID-19 infection under section 564(b)(1) of the Act, 21 U.S.C. GF:7541899) (1), unless the authorization is terminated or revoked sooner. When diagnostic testing is negativ e, the possibility of a false negative result should be considered in the context of a patient's recent exposures and the presence of clinical signs and symptoms consistent with COVID-19. An individual without symptoms of COVID-19 and who is not shedding SARS-CoV-2 virus would expect to have a negative (not detected) result in this assay.   SARS-COV-2, NAA 2 DAY TAT     Status: None   Collection Time: 03/30/20 10:44 AM   Nasopharynge  Result Value Ref Range Status   SARS-CoV-2, NAA 2 DAY TAT Performed  Final     Studies: US RENAL  Result Date: 04/01/2020 CLINICAL DATA:  Acute kidney injury EXAM: RENAL / URINARY TRACT ULTRASOUND COMPLETE COMPARISON:  None  FINDINGS: Right Kidney: Renal measurements: 10.1 x 4.5 x 4.9 cm = volume: 118 mL. Cortical thinning. Borderline increased cortical echogenicity. No mass, hydronephrosis, or shadowing calcification. Left Kidney: Renal measurements: 10.7 x 4.4 x 4.8 cm = volume: 119 mL. Cortical thinning. Increased cortical echogenicity. Small cysts LEFT kidney largest 2.2 x 2.3 x 2.0 cm, simple in character. No additional mass, hydronephrosis, or shadowing calcification. Nonspecific 5 mm nonshadowing echogenic focus mid LEFT kidney, could represent vascular calcification, a nonobstructing calculus, or artifact. Bladder: Appears normal for degree of bladder distention. Other: Prostatic enlargement, gland measuring 3.8 x 3.1 x 4.2 cm (volume = 26 cm^3) IMPRESSION: Prostatic enlargement. BILATERAL renal cortical atrophy and medical renal disease changes. Small LEFT renal cysts, largest 2.3 cm diameter. No evidence of solid renal mass or hydronephrosis. Electronically Signed   By: Lavonia Dana M.D.   On: 04/01/2020 15:01   DG Chest Portable 1 View  Result Date: 04/01/2020 CLINICAL DATA:  55 year old male with COVID EXAM: PORTABLE CHEST 1 VIEW COMPARISON:  10/30/2019 FINDINGS: Cardiomediastinal silhouette unchanged in size and contour. No evidence of central vascular congestion. No pneumothorax or pleural effusion. No confluent airspace disease. No displaced fracture IMPRESSION: Negative for acute cardiopulmonary disease Electronically Signed   By: Corrie Mckusick D.O.   On: 04/01/2020 12:04      Flora Lipps, MD  [facility] 04/02/2020  If 7PM-7AM, please contact night-coverage

## 2020-04-02 NOTE — Plan of Care (Signed)

## 2020-04-02 NOTE — Progress Notes (Signed)
Progress Note for [facility]  Subjective: No acute events.  The patient reports problems with hematochezia last evening.  Objective: Vital signs in last 24 hours: Temp:  [97.7 F (36.5 C)-98.8 F (37.1 C)] 98.1 F (36.7 C) (01/30 0507) Pulse Rate:  [66-101] 76 (01/30 0507) Resp:  [15-25] 18 (01/30 0507) BP: (128-168)/(70-114) 129/76 (01/30 0507) SpO2:  [78 %-100 %] 100 % (01/30 0507) Weight:  [63.5 kg] 63.5 kg (01/29 1058)    Intake/Output from previous day: 01/29 0701 - 01/30 0700 In: 349.8 [I.V.:349.8] Out: -  Intake/Output this shift: No intake/output data recorded.  General appearance: alert and no distress GI: soft, non-tender; bowel sounds normal; no masses,  no organomegaly  Lab Results: Recent Labs    04/01/20 1304 04/02/20 0351  WBC 4.7 3.0*  HGB 13.9 10.8*  HCT 44.9 35.6*  PLT 153 101*   BMET Recent Labs    04/01/20 1304 04/02/20 0351  NA 139 143  K 3.2* 3.5  CL 104 113*  CO2 25 23  GLUCOSE 99 88  BUN 12 10  CREATININE 2.55* 1.97*  CALCIUM 9.0 8.4*   LFT Recent Labs    04/02/20 0351  PROT 5.8*  ALBUMIN 3.0*  AST 15  ALT 13  ALKPHOS 47  BILITOT 0.2*   PT/INR No results for input(s): LABPROT, INR in the last 72 hours. Hepatitis Panel No results for input(s): HEPBSAG, HCVAB, HEPAIGM, HEPBIGM in the last 72 hours. C-Diff No results for input(s): CDIFFTOX in the last 72 hours. Fecal Lactopherrin No results for input(s): FECLLACTOFRN in the last 72 hours.  Studies/Results: US RENAL  Result Date: 04/01/2020 CLINICAL DATA:  Acute kidney injury EXAM: RENAL / URINARY TRACT ULTRASOUND COMPLETE COMPARISON:  None FINDINGS: Right Kidney: Renal measurements: 10.1 x 4.5 x 4.9 cm = volume: 118 mL. Cortical thinning. Borderline increased cortical echogenicity. No mass, hydronephrosis, or shadowing calcification. Left Kidney: Renal measurements: 10.7 x 4.4 x 4.8 cm = volume: 119 mL. Cortical thinning. Increased cortical echogenicity. Small cysts LEFT  kidney largest 2.2 x 2.3 x 2.0 cm, simple in character. No additional mass, hydronephrosis, or shadowing calcification. Nonspecific 5 mm nonshadowing echogenic focus mid LEFT kidney, could represent vascular calcification, a nonobstructing calculus, or artifact. Bladder: Appears normal for degree of bladder distention. Other: Prostatic enlargement, gland measuring 3.8 x 3.1 x 4.2 cm (volume = 26 cm^3) IMPRESSION: Prostatic enlargement. BILATERAL renal cortical atrophy and medical renal disease changes. Small LEFT renal cysts, largest 2.3 cm diameter. No evidence of solid renal mass or hydronephrosis. Electronically Signed   By: Lavonia Dana M.D.   On: 04/01/2020 15:01   DG Chest Portable 1 View  Result Date: 04/01/2020 CLINICAL DATA:  55 year old male with COVID EXAM: PORTABLE CHEST 1 VIEW COMPARISON:  10/30/2019 FINDINGS: Cardiomediastinal silhouette unchanged in size and contour. No evidence of central vascular congestion. No pneumothorax or pleural effusion. No confluent airspace disease. No displaced fracture IMPRESSION: Negative for acute cardiopulmonary disease Electronically Signed   By: Corrie Mckusick D.O.   On: 04/01/2020 12:04    Medications:  Scheduled: . famotidine  20 mg Oral Daily  . FLUoxetine  20 mg Oral QHS  . heparin  5,000 Units Subcutaneous Q8H  . levETIRAcetam  1,000 mg Oral BID  . levothyroxine  75 mcg Oral Once per day on Sun Mon Wed Thu Sat  . [START ON 04/04/2020] levothyroxine  87.5 mcg Oral Once per day on Tue Fri  . loratadine  10 mg Oral Daily  . pantoprazole  20 mg Oral BID AC  . polyethylene glycol-electrolytes  4,000 mL Oral Once  . rosuvastatin  20 mg Oral Daily   Continuous: . sodium chloride 150 mL/hr at 04/02/20 6659    Assessment/Plan: 1) Hematochezia. 2) Anemia. 3) Dysphagia - history of a distal esophageal stricture s/p dilation.   The rectal examination this AM did not any hemorrhoidal pathology outside of some external skin tags.  The rectal  examination was negative for any masses or fissures, but there was evidence of some fresh blood.  The patient also has issues with swallowing.  With the bleeding, drop in his HGB, weight loss, and dysphagia issues, he will be scheduled for an EGD with possible dilation/colonoscopy tomorrow with [facility].  LOS: 0 days   Brittay Mogle D 04/02/2020, 8:55 AM

## 2020-04-03 ENCOUNTER — Encounter (HOSPITAL_COMMUNITY): Payer: Self-pay | Admitting: Internal Medicine

## 2020-04-03 ENCOUNTER — Inpatient Hospital Stay (HOSPITAL_COMMUNITY): Payer: Medicare HMO | Admitting: Anesthesiology

## 2020-04-03 ENCOUNTER — Encounter (HOSPITAL_COMMUNITY)
Admission: EM | Disposition: A | Discharge status: Home / Self Care | Payer: Self-pay | Source: Home / Self Care | Attending: Internal Medicine

## 2020-04-03 DIAGNOSIS — Z79899 Other long term (current) drug therapy: Secondary | ICD-10-CM | POA: Diagnosis not present

## 2020-04-03 DIAGNOSIS — K921 Melena: Secondary | ICD-10-CM

## 2020-04-03 DIAGNOSIS — E876 Hypokalemia: Secondary | ICD-10-CM | POA: Diagnosis present

## 2020-04-03 DIAGNOSIS — N400 Enlarged prostate without lower urinary tract symptoms: Secondary | ICD-10-CM | POA: Diagnosis present

## 2020-04-03 DIAGNOSIS — Z86010 Personal history of colonic polyps: Secondary | ICD-10-CM | POA: Diagnosis not present

## 2020-04-03 DIAGNOSIS — Z79890 Hormone replacement therapy (postmenopausal): Secondary | ICD-10-CM | POA: Diagnosis not present

## 2020-04-03 DIAGNOSIS — Z8371 Family history of colonic polyps: Secondary | ICD-10-CM | POA: Diagnosis not present

## 2020-04-03 DIAGNOSIS — K2100 Gastro-esophageal reflux disease with esophagitis, without bleeding: Secondary | ICD-10-CM

## 2020-04-03 DIAGNOSIS — N179 Acute kidney failure, unspecified: Secondary | ICD-10-CM | POA: Diagnosis not present

## 2020-04-03 DIAGNOSIS — K625 Hemorrhage of anus and rectum: Secondary | ICD-10-CM | POA: Diagnosis present

## 2020-04-03 DIAGNOSIS — K449 Diaphragmatic hernia without obstruction or gangrene: Secondary | ICD-10-CM | POA: Diagnosis present

## 2020-04-03 DIAGNOSIS — Z881 Allergy status to other antibiotic agents status: Secondary | ICD-10-CM | POA: Diagnosis not present

## 2020-04-03 DIAGNOSIS — K922 Gastrointestinal hemorrhage, unspecified: Secondary | ICD-10-CM | POA: Diagnosis not present

## 2020-04-03 DIAGNOSIS — R1310 Dysphagia, unspecified: Secondary | ICD-10-CM

## 2020-04-03 DIAGNOSIS — K644 Residual hemorrhoidal skin tags: Secondary | ICD-10-CM | POA: Diagnosis present

## 2020-04-03 DIAGNOSIS — E039 Hypothyroidism, unspecified: Secondary | ICD-10-CM | POA: Diagnosis present

## 2020-04-03 DIAGNOSIS — D649 Anemia, unspecified: Secondary | ICD-10-CM | POA: Diagnosis present

## 2020-04-03 DIAGNOSIS — R197 Diarrhea, unspecified: Secondary | ICD-10-CM | POA: Diagnosis not present

## 2020-04-03 DIAGNOSIS — K529 Noninfective gastroenteritis and colitis, unspecified: Secondary | ICD-10-CM | POA: Diagnosis present

## 2020-04-03 DIAGNOSIS — H905 Unspecified sensorineural hearing loss: Secondary | ICD-10-CM | POA: Diagnosis present

## 2020-04-03 DIAGNOSIS — K648 Other hemorrhoids: Secondary | ICD-10-CM | POA: Diagnosis present

## 2020-04-03 DIAGNOSIS — U071 COVID-19: Secondary | ICD-10-CM | POA: Diagnosis present

## 2020-04-03 DIAGNOSIS — K649 Unspecified hemorrhoids: Secondary | ICD-10-CM

## 2020-04-03 DIAGNOSIS — G40909 Epilepsy, unspecified, not intractable, without status epilepticus: Secondary | ICD-10-CM | POA: Diagnosis present

## 2020-04-03 HISTORY — PX: BIOPSY: SHX5522

## 2020-04-03 HISTORY — PX: COLONOSCOPY WITH PROPOFOL: SHX5780

## 2020-04-03 HISTORY — PX: ESOPHAGOGASTRODUODENOSCOPY (EGD) WITH PROPOFOL: SHX5813

## 2020-04-03 LAB — CBC WITH DIFFERENTIAL/PLATELET
Abs Immature Granulocytes: 0 10*3/uL (ref 0.00–0.07)
Basophils Absolute: 0 10*3/uL (ref 0.0–0.1)
Basophils Relative: 0 %
Eosinophils Absolute: 0.1 10*3/uL (ref 0.0–0.5)
Eosinophils Relative: 2 %
HCT: 34.2 % — ABNORMAL LOW (ref 39.0–52.0)
Hemoglobin: 10.5 g/dL — ABNORMAL LOW (ref 13.0–17.0)
Immature Granulocytes: 0 %
Lymphocytes Relative: 22 %
Lymphs Abs: 0.5 10*3/uL — ABNORMAL LOW (ref 0.7–4.0)
MCH: 26.4 pg (ref 26.0–34.0)
MCHC: 30.7 g/dL (ref 30.0–36.0)
MCV: 85.9 fL (ref 80.0–100.0)
Monocytes Absolute: 0.2 10*3/uL (ref 0.1–1.0)
Monocytes Relative: 7 %
Neutro Abs: 1.6 10*3/uL — ABNORMAL LOW (ref 1.7–7.7)
Neutrophils Relative %: 69 %
Platelets: 89 10*3/uL — ABNORMAL LOW (ref 150–400)
RBC: 3.98 MIL/uL — ABNORMAL LOW (ref 4.22–5.81)
RDW: 14 % (ref 11.5–15.5)
WBC: 2.4 10*3/uL — ABNORMAL LOW (ref 4.0–10.5)
nRBC: 0 % (ref 0.0–0.2)

## 2020-04-03 LAB — COMPREHENSIVE METABOLIC PANEL
ALT: 14 U/L (ref 0–44)
AST: 16 U/L (ref 15–41)
Albumin: 3.1 g/dL — ABNORMAL LOW (ref 3.5–5.0)
Alkaline Phosphatase: 51 U/L (ref 38–126)
Anion gap: 7 (ref 5–15)
BUN: 7 mg/dL (ref 6–20)
CO2: 22 mmol/L (ref 22–32)
Calcium: 8.5 mg/dL — ABNORMAL LOW (ref 8.9–10.3)
Chloride: 113 mmol/L — ABNORMAL HIGH (ref 98–111)
Creatinine, Ser: 1.6 mg/dL — ABNORMAL HIGH (ref 0.61–1.24)
GFR, Estimated: 51 mL/min — ABNORMAL LOW (ref >60)
Glucose, Bld: 84 mg/dL (ref 70–99)
Potassium: 3.6 mmol/L (ref 3.5–5.1)
Sodium: 142 mmol/L (ref 135–145)
Total Bilirubin: 0.7 mg/dL (ref 0.3–1.2)
Total Protein: 5.9 g/dL — ABNORMAL LOW (ref 6.5–8.1)

## 2020-04-03 LAB — MAGNESIUM: Magnesium: 2.1 mg/dL (ref 1.7–2.4)

## 2020-04-03 LAB — D-DIMER, QUANTITATIVE: D-Dimer, Quant: 0.5 ug/mL-FEU (ref 0.00–0.50)

## 2020-04-03 LAB — GLUCOSE, CAPILLARY: Glucose-Capillary: 73 mg/dL (ref 70–99)

## 2020-04-03 LAB — C-REACTIVE PROTEIN: CRP: 0.9 mg/dL (ref <1.0)

## 2020-04-03 LAB — FERRITIN: Ferritin: 14 ng/mL — ABNORMAL LOW (ref 24–336)

## 2020-04-03 LAB — PHOSPHORUS: Phosphorus: 2.2 mg/dL — ABNORMAL LOW (ref 2.5–4.6)

## 2020-04-03 SURGERY — COLONOSCOPY WITH PROPOFOL
Anesthesia: Monitor Anesthesia Care

## 2020-04-03 MED ORDER — PANTOPRAZOLE SODIUM 40 MG PO TBEC
40.0000 mg | DELAYED_RELEASE_TABLET | Freq: Two times a day (BID) | ORAL | Status: DC
  Administered 2020-04-03 – 2020-04-04 (×2): 40 mg via ORAL
  Filled 2020-04-03 (×3): qty 1

## 2020-04-03 MED ORDER — PROPOFOL 500 MG/50ML IV EMUL
INTRAVENOUS | Status: AC
  Filled 2020-04-03: qty 50

## 2020-04-03 MED ORDER — HYDROCORTISONE ACETATE 25 MG RE SUPP
25.0000 mg | Freq: Two times a day (BID) | RECTAL | Status: DC
  Administered 2020-04-03 – 2020-04-04 (×2): 25 mg via RECTAL
  Filled 2020-04-03 (×2): qty 1

## 2020-04-03 MED ORDER — K PHOS MONO-SOD PHOS DI & MONO 155-852-130 MG PO TABS
500.0000 mg | ORAL_TABLET | Freq: Three times a day (TID) | ORAL | Status: DC
  Administered 2020-04-03 – 2020-04-04 (×2): 500 mg via ORAL
  Filled 2020-04-03 (×2): qty 2

## 2020-04-03 MED ORDER — LIDOCAINE 2% (20 MG/ML) 5 ML SYRINGE
INTRAMUSCULAR | Status: DC | PRN
  Administered 2020-04-03: 40 mg via INTRAVENOUS
  Administered 2020-04-03: 60 mg via INTRAVENOUS

## 2020-04-03 MED ORDER — LACTATED RINGERS IV SOLN: INTRAVENOUS | Status: DC | PRN

## 2020-04-03 MED ORDER — PROPOFOL 500 MG/50ML IV EMUL
INTRAVENOUS | Status: DC | PRN
  Administered 2020-04-03: 125 ug/kg/min via INTRAVENOUS

## 2020-04-03 MED ORDER — PROPOFOL 10 MG/ML IV BOLUS
INTRAVENOUS | Status: DC | PRN
  Administered 2020-04-03 (×2): 20 mg via INTRAVENOUS

## 2020-04-03 SURGICAL SUPPLY — 25 items

## 2020-04-03 NOTE — Plan of Care (Signed)
  Problem: Respiratory: Goal: Will maintain a patent airway Outcome: Progressing   

## 2020-04-03 NOTE — Evaluation (Signed)
Physical Therapy Evaluation Patient Details Name: Damon [name]. MRN: [id number] DOB: 1965-04-07 Today's Date: 04/03/2020   History of Present Illness  Patient is 55 y.o. male with PMH significant for TBI, congenital deafness, developmental delay, seizure disorder, thyroid disease, anxiety, ACDF in 2019. Patient presented to the  hospital with worsening weakness, bloody diarrhea and weight loss. Patient was then admitted hospital for generalized weakness acute kidney injury, bloody diarrhea, and COVID-19 infection.    Clinical Impression  Damon [name]. is 55 y.o. male admitted with above HPI and diagnosis. Patient is currently limited by functional impairments below (see PT problem list). Patient reports he lives alone however EMR indicates he lives with his mother. Patient reports he is independent with [facility] for mobility at baseline. Patient required min assist for transfers and gait and is unsteady with mild ataxia. Pt reports this is his baseline for gait. He required use of RW vs SPC today and tactile cues needed to manage walker safely with turns in room. Patient is reliant on UE's for sit<>stands and completed 5x sit<>stand in 24.4 seconds indicated generalized functional weakness. Patient will benefit from continued skilled PT interventions to address impairments and progress independence with mobility, recommending HHPT vs no follow up pending progress in acute setting. Acute PT will follow and progress as able.     Follow Up Recommendations Home health PT;No PT follow up (HHPT vs No follow up pending progression with acute PT)    Equipment Recommendations  None recommended by PT (TBA)    Recommendations for Other Services       Precautions / Restrictions Precautions Precautions: Fall Restrictions Weight Bearing Restrictions: No (Simultaneous filing. User may not have seen previous data.)      Mobility  Bed Mobility Overal bed mobility: Modified Independent Bed Mobility:  Supine to Sit;Sit to Supine     Supine to sit: Modified independent (Device/Increase time);HOB elevated Sit to supine: Modified independent (Device/Increase time);HOB elevated        Transfers Overall transfer level: Needs assistance Equipment used: 1 person hand held assist Transfers: Sit to/from Stand Sit to Stand: Min assist;Min guard         General transfer comment: 1HHA to steady. pt initiated rise from EOB however unsteady requiring min assist in standing. pt using bil UE for power up and back of legs on bed to stabilize.  Ambulation/Gait Ambulation/Gait assistance: Min assist;+2 safety/equipment Gait Distance (Feet): 20 Feet (in room) Assistive device: Rolling walker (2 wheeled) Gait Pattern/deviations: Step-through pattern;Decreased stride length;Ataxic (mildly ataxic gait) Gait velocity: decr   General Gait Details: pt preferred RW for gait vs SPC. pt unsteady with 2+ for safety, min assist to manage RW position during turns. pt keeping walker ahead of himself despite cues for safe proximity. pt with mildly ataxic steps, NBOS with pronation noted on Rt foot.  Stairs            Wheelchair Mobility    Modified Rankin (Stroke Patients Only)       Balance Overall balance assessment: Needs assistance Sitting-balance support: Feet supported Sitting balance-Leahy Scale: Good Sitting balance - Comments: pt sat EOB and donned underwear.   Standing balance support: During functional activity;Bilateral upper extremity supported;Single extremity supported Standing balance-Leahy Scale: Poor Standing balance comment: pt reliant on external support of RW and therapist to steady and prevent LOB. pt required min assist to steady while pulling underwear up in standing. Single Leg Stance - Right Leg: 0 Single Leg Stance - Left Leg:  0           High Level Balance Comments: pt unable to complete SLS bil LE. Pt with significant leaning and poor hip strategies for  balance.             Pertinent Vitals/Pain Pain Assessment: Faces Faces Pain Scale: No hurt Pain Intervention(s): Monitored during session    Home Living Family/patient expects to be discharged to:: Private residence Living Arrangements: Alone (pt reports lives alone, chart review indicates he lives with his mother) Available Help at Discharge: Family             Additional Comments: pt reports that he drives and does own grocery shopping, mom is available to help at home. attempted calling pt's mom to confirm PLOF but no answer.    Prior Function Level of Independence: Independent with assistive device(s)         Comments: pt reports he is indepenednt with SPC for mobility. However in room prefers use of RW for gait.     Hand Dominance   Dominant Hand: Right    Extremity/Trunk Assessment   Upper Extremity Assessment Upper Extremity Assessment: Overall WFL for tasks assessed    Lower Extremity Assessment Lower Extremity Assessment: Generalized weakness;RLE deficits/detail;LLE deficits/detail RLE Coordination: decreased gross motor LLE Coordination: decreased gross motor    Cervical / Trunk Assessment Cervical / Trunk Assessment: Normal  Communication   Communication: Deaf (pt using paper/pen and declined Optometrist)  Cognition Arousal/Alertness: Awake/alert Behavior During Therapy: WFL for tasks assessed/performed Overall Cognitive Status: Within Functional Limits for tasks assessed                                 General Comments: pt pleasant and signing some with therapist and using gestures as well as paper/pen to communicate.      General Comments General comments (skin integrity, edema, etc.): 5xSit<>Stand: 24.46 seconds. pt required bil UE use for power up and bil LE's against bed to stabilize in standing. pt never let go of bed rail and did not fully stand.    Exercises     Assessment/Plan    PT Assessment Patient needs  continued PT services  PT Problem List Decreased strength;Decreased activity tolerance;Decreased balance;Decreased mobility;Decreased coordination;Decreased knowledge of use of DME       PT Treatment Interventions DME instruction;Gait training;Functional mobility training;Stair training;Therapeutic activities;Therapeutic exercise;Neuromuscular re-education;Balance training;Patient/family education    PT Goals (Current goals can be found in the Care Plan section)  Acute Rehab PT Goals Patient Stated Goal: none stated, pt showed picture of his mom to therapists. PT Goal Formulation: With patient Time For Goal Achievement: 04/17/20 Potential to Achieve Goals: Good    Frequency Min 3X/week   Barriers to discharge        Co-evaluation               AM-PAC PT "6 Clicks" Mobility  Outcome Measure Help needed turning from your back to your side while in a flat bed without using bedrails?: None Help needed moving from lying on your back to sitting on the side of a flat bed without using bedrails?: None Help needed moving to and from a bed to a chair (including a wheelchair)?: A Little Help needed standing up from a chair using your arms (e.g., wheelchair or bedside chair)?: A Little Help needed to walk in hospital room?: A Little Help needed climbing 3-5 steps with a railing? : A  Little 6 Click Score: 20    End of Session   Activity Tolerance: Patient tolerated treatment well Patient left: in bed;with call bell/phone within reach;with bed alarm set Nurse Communication: Mobility status PT Visit Diagnosis: Muscle weakness (generalized) (M62.81);Difficulty in walking, not elsewhere classified (R26.2);Unsteadiness on feet (R26.81);Other abnormalities of gait and mobility (R26.89)    Time: [id number] PT Time Calculation (min) (ACUTE ONLY): 20 min   Charges:   PT Evaluation $PT Eval Moderate Complexity: 1 Mod          Verner Mould, DPT Acute Rehabilitation Services Office  [phone] Pager [phone]    Jacques Navy 04/03/2020, 7:23 PM

## 2020-04-03 NOTE — Transfer of Care (Signed)
Immediate Anesthesia Transfer of Care Note  Patient: Damon [name].  Procedure(s) Performed: COLONOSCOPY WITH PROPOFOL (N/A ) ESOPHAGOGASTRODUODENOSCOPY (EGD) WITH PROPOFOL (N/A ) BIOPSY  Patient Location: PACU and Endoscopy Unit  Anesthesia Type:MAC  Level of Consciousness: awake and patient cooperative  Airway & Oxygen Therapy: Patient Spontanous Breathing and Patient connected to nasal cannula oxygen  Post-op Assessment: Report given to RN and Post -op Vital signs reviewed and stable  Post vital signs: Reviewed and stable  Last Vitals:  Vitals Value Taken Time  BP 120/57 04/03/20 1336  Temp    Pulse 77 04/03/20 1337  Resp    SpO2 100 % 04/03/20 1337  Vitals shown include unvalidated device data.  Last Pain:  Vitals:   04/03/20 1302  TempSrc:   PainSc: 0-No pain         Complications: No complications documented.

## 2020-04-03 NOTE — Anesthesia Procedure Notes (Signed)
Procedure Name: MAC Date/Time: 04/03/2020 1:00 PM Performed by: Lollie Sails, CRNA Pre-anesthesia Checklist: Patient identified, Emergency Drugs available, Patient being monitored, Suction available and Timeout performed Oxygen Delivery Method: Simple face mask Preoxygenation: POM used.

## 2020-04-03 NOTE — Op Note (Signed)
[facility] Patient Name: Damon [name] Procedure Date: 04/03/2020 MRN: [id number] Attending MD: Mauri Pole , MD Date of Birth: 11-11-1965 CSN: [id number] Age: 55 Admit Type: Outpatient Procedure:                Colonoscopy Indications:              Evaluation of unexplained GI bleeding presenting                            with Hematochezia, Clinically significant diarrhea                            of unexplained origin. Covid-19 infection Providers:                Mauri Pole, MD, Janee Morn,                            Technician, Kary Kos RN, RN Referring MD:              Medicines:                Monitored Anesthesia Care Complications:            No immediate complications. Estimated Blood Loss:     Estimated blood loss was minimal. Procedure:                Pre-Anesthesia Assessment:                           - Prior to the procedure, a History and Physical                            was performed, and patient medications and                            allergies were reviewed. The patient's tolerance of                            previous anesthesia was also reviewed. The risks                            and benefits of the procedure and the sedation                            options and risks were discussed with the patient.                            All questions were answered, and informed consent                            was obtained. Prior Anticoagulants: The patient has                            taken no previous anticoagulant or antiplatelet                            agents.  ASA Grade Assessment: III - A patient with                            severe systemic disease. After reviewing the risks                            and benefits, the patient was deemed in                            satisfactory condition to undergo the procedure.                           After obtaining informed consent, the colonoscope                             was passed under direct vision. Throughout the                            procedure, the patient's blood pressure, pulse, and                            oxygen saturations were monitored continuously. The                            PCF-H190DL ([id number] Olympus pediatric colonscope                            was introduced through the anus and advanced to the                            the cecum, identified by appendiceal orifice and                            ileocecal valve. The colonoscopy was performed                            without difficulty. The patient tolerated the                            procedure well. The quality of the bowel                            preparation was good. The ileocecal valve,                            appendiceal orifice, and rectum were photographed. Scope In: 1:07:35 PM Scope Out: 1:23:27 PM Scope Withdrawal Time: 0 hours 7 minutes 9 seconds  Total Procedure Duration: 0 hours 15 minutes 52 seconds  Findings:      The perianal and digital rectal examinations were normal.      A patchy area of mildly friable mucosa with contact bleeding was found       in the entire colon. Biopsies were taken with a cold forceps for       histology.  Bleeding external and internal hemorrhoids were found during       retroflexion. The hemorrhoids were medium-sized. Impression:               - Friability with contact bleeding in the entire                            examined colon. Biopsied. Differential includes                            ischemic colitis vs mild ulcerative colitis                           - Bleeding external and internal hemorrhoids. Moderate Sedation:      Not Applicable - Patient had care per Anesthesia. Recommendation:           - Patient has a contact number available for                            emergencies. The signs and symptoms of potential                            delayed complications were discussed with the                             patient. Return to normal activities tomorrow.                            Written discharge instructions were provided to the                            patient.                           - Resume previous diet.                           - Continue present medications.                           - Await pathology results.                           - Use Benefiber two teaspoons PO TID.                           - Use hydrocortisone suppository 25 mg 1 per rectum                            once a day for 1 week.                           - Monitor Hgb daily                           - Inpatient GI is available if needed, please call  with any questions                           - Will arrange for outpatient GI follow up on                            discharge in 2-4 weeks Procedure Code(s):        --- Professional ---                           [phone], Colonoscopy, flexible; with biopsy, single                            or multiple Diagnosis Code(s):        --- Professional ---                           K92.2, Gastrointestinal hemorrhage, unspecified                           K64.8, Other hemorrhoids                           K92.1, Melena (includes Hematochezia)                           R19.7, Diarrhea, unspecified CPT copyright 2019 American Medical Association. All rights reserved. The codes documented in this report are preliminary and upon coder review may  be revised to meet current compliance requirements. Mauri Pole, MD 04/03/2020 1:42:15 PM This report has been signed electronically. Number of Addenda: 0

## 2020-04-03 NOTE — Anesthesia Postprocedure Evaluation (Signed)
Anesthesia Post Note  Patient: Damon [name].  Procedure(s) Performed: COLONOSCOPY WITH PROPOFOL (N/A ) ESOPHAGOGASTRODUODENOSCOPY (EGD) WITH PROPOFOL (N/A ) BIOPSY     Patient location during evaluation: PACU Anesthesia Type: MAC Level of consciousness: awake and alert Pain management: pain level controlled Vital Signs Assessment: post-procedure vital signs reviewed and stable Respiratory status: spontaneous breathing, nonlabored ventilation and respiratory function stable Cardiovascular status: stable and blood pressure returned to baseline Postop Assessment: no apparent nausea or vomiting Anesthetic complications: no   No complications documented.  Last Vitals:  Vitals:   04/03/20 1347 04/03/20 1353  BP: 127/69 123/79  Pulse: 68 73  Resp: 20 18  Temp:    SpO2: 100% 100%    Last Pain:  Vitals:   04/03/20 1353  TempSrc:   PainSc: 0-No pain                 Siarra Gilkerson,W. EDMOND
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
## 2020-04-03 NOTE — Anesthesia Preprocedure Evaluation (Addendum)
Anesthesia Evaluation  Patient identified by MRN, date of birth, ID band Patient awake    Reviewed: Allergy & Precautions, H&P , NPO status , Patient's Chart, lab work & pertinent test results  Airway Mallampati: II  TM Distance: >3 FB Neck ROM: Full    Dental no notable dental hx. (+) Teeth Intact, Dental Advisory Given   Pulmonary asthma ,    Pulmonary exam normal breath sounds clear to auscultation       Cardiovascular negative cardio ROS   Rhythm:Regular Rate:Normal     Neuro/Psych Seizures -, Well Controlled,  Anxiety Depression    GI/Hepatic Neg liver ROS, GERD  Medicated,  Endo/Other  Hypothyroidism   Renal/GU Renal InsufficiencyRenal disease  negative genitourinary   Musculoskeletal   Abdominal   Peds  Hematology negative hematology ROS (+)   Anesthesia Other Findings   Reproductive/Obstetrics negative OB ROS                            Anesthesia Physical Anesthesia Plan  ASA: II  Anesthesia Plan: MAC   Post-op Pain Management:    Induction: Intravenous  PONV Risk Score and Plan: 1 and Propofol infusion and Treatment may vary due to age or medical condition  Airway Management Planned: Simple Face Mask  Additional Equipment:   Intra-op Plan:   Post-operative Plan:   Informed Consent: I have reviewed the patients History and Physical, chart, labs and discussed the procedure including the risks, benefits and alternatives for the proposed anesthesia with the patient or authorized representative who has indicated his/her understanding and acceptance.     Dental advisory given  Plan Discussed with: CRNA  Anesthesia Plan Comments:         Anesthesia Quick Evaluation

## 2020-04-03 NOTE — Op Note (Signed)
[facility] Patient Name: [facility] Procedure Date: 04/03/2020 MRN: [id number] Attending MD: Mauri Pole , MD Date of Birth: 1965/10/02 CSN: [id number] Age: 55 Admit Type: Inpatient Procedure:                Upper GI endoscopy Indications:              Gastrointestinal bleeding of unknown origin,                            Dysphagia Providers:                Mauri Pole, MD, Kary Kos RN, RN,                            Fransico Setters Mbumina, Technician Referring MD:              Medicines:                Monitored Anesthesia Care Complications:            No immediate complications. Estimated Blood Loss:     Estimated blood loss: none. Procedure:                Pre-Anesthesia Assessment:                           - Prior to the procedure, a History and Physical                            was performed, and patient medications and                            allergies were reviewed. The patient's tolerance of                            previous anesthesia was also reviewed. The risks                            and benefits of the procedure and the sedation                            options and risks were discussed with the patient.                            All questions were answered, and informed consent                            was obtained. Prior Anticoagulants: The patient has                            taken no previous anticoagulant or antiplatelet                            agents. ASA Grade Assessment: III - A patient with                            severe  systemic disease. After reviewing the risks                            and benefits, the patient was deemed in                            satisfactory condition to undergo the procedure.                           After obtaining informed consent, the endoscope was                            passed under direct vision. Throughout the                            procedure, the patient's  blood pressure, pulse, and                            oxygen saturations were monitored continuously. The                            GIF-H190 ([id number]) Olympus gastroscope was                            introduced through the mouth, and advanced to the                            second part of duodenum. The upper GI endoscopy was                            accomplished without difficulty. The patient                            tolerated the procedure well. Findings:      The Z-line was regular and was found 35 cm from the incisors.      LA Grade B (one or more mucosal breaks greater than 5 mm, not extending       between the tops of two mucosal folds) esophagitis with no bleeding was       found 34 to 35 cm from the incisors.      A medium-sized hiatal hernia was present.      The stomach was normal.      The examined duodenum was normal. Impression:               - Z-line regular, 35 cm from the incisors.                           - LA Grade B reflux esophagitis with no bleeding.                           - Medium-sized hiatal hernia.                           - Normal stomach.                           -  Normal examined duodenum.                           - No specimens collected. Moderate Sedation:      N/A Recommendation:           - Patient has a contact number available for                            emergencies. The signs and symptoms of potential                            delayed complications were discussed with the                            patient. Return to normal activities tomorrow.                            Written discharge instructions were provided to the                            patient.                           - Resume previous diet.                           - Continue present medications.                           - Follow an antireflux regimen.                           - Use Protonix (pantoprazole) 40 mg PO BID. Procedure Code(s):        ---  Professional ---                           [phone], Esophagogastroduodenoscopy, flexible,                            transoral; diagnostic, including collection of                            specimen(s) by brushing or washing, when performed                            (separate procedure) Diagnosis Code(s):        --- Professional ---                           K21.00, Gastro-esophageal reflux disease with                            esophagitis, without bleeding                           K44.9, Diaphragmatic hernia without obstruction or  gangrene                           K92.2, Gastrointestinal hemorrhage, unspecified                           R13.10, Dysphagia, unspecified CPT copyright 2019 American Medical Association. All rights reserved. The codes documented in this report are preliminary and upon coder review may  be revised to meet current compliance requirements. Mauri Pole, MD 04/03/2020 1:48:31 PM This report has been signed electronically. Number of Addenda: 0

## 2020-04-03 NOTE — Progress Notes (Signed)
PROGRESS NOTE  Damon [name]. [id number] DOB: August 25, 1965 DOA: 04/01/2020 PCP: [name], PA-C   LOS: 0 days   Brief narrative:  Damon [name]. is a 55 y.o. male with medical history significant of TBI, deafness at baseline, seizure disorder, hypothyroidism, presented to the  hospital with worsening weakness, bloody diarrhea and weight loss.  There was report of around 20 pound weight loss in 1 to 2 months ago with significantly decreased oral intake.  Patient was tested for Covid 2 days back which was positive.  Patient lives with his mother who also reported on-and-off bloody diarrhea for the last 2 months.  In the ED, patient was found to have AKI.  Creatinine 2.5 compared to baseline less than 1.  Chest x-ray clear, no significant infiltrates, no hypoxia.  Patient was then admitted hospital for generalized weakness acute kidney injury bloody diarrhea and COVID-19 infection.  Assessment/Plan:  Principal Problem:   AKI (acute kidney injury) ([place]) Active Problems:   Hypothyroidism   Seizure disorder ([place])   COVID-19 virus infection   Bloody diarrhea   GI bleed  Acute kidney injury likely secondary to poor oral intake, bloody diarrhea.  Improving creatinine levels IV hydration.  Renal ultrasound shows prostatic enlargement with cortical atrophy and medical renal disease no hydronephrosis noted.  Patient presented with creatinine of 2.5 compared to baseline less than 1.  We will continue to monitor BMP closely.  Creatinine today is 1.6.  Trending down.  We will continue to hydrate  Lab Results  Component Value Date   CREATININE 1.60 (H) 04/03/2020   CREATININE 1.97 (H) 04/02/2020   CREATININE 2.55 (H) 04/01/2020    Bloody diarrhea Seen by GI Dr. Benson Norway.   History of colonoscopy in the past.  Currently n.p.o. for possible EGD/ colonoscopy today..  As per GI, rectal examination showed no hemorrhoids but bright red blood.   Transfuse for hemoglobin less than 7.  Hemoglobin  was 10.5.  COVID-19 infection No pneumonia or hypoxia.  Likely incidental infection.  We will continue to monitor closely.  Inflammatory markers minimally elevated  COVID-19 Labs  Recent Labs    04/02/20 0351 04/03/20 0339  DDIMER 0.53* 0.50  FERRITIN 13* 14*  CRP 1.3* 0.9    Lab Results  Component Value Date   SARSCOV2NAA Detected (A) 03/30/2020   [place] Not Detected 11/02/2018   Hypothyroidism -Continue Synthroid  Seizure disorder -Continue Keppra  Mild hypokalemia.  Present on admission.  Improved.  Mild hypophosphatemia.  Will replace after n.p.o. is over.  DVT prophylaxis: Discontinue heparin subcu for now due to concerns for rectal bleeding.  Code Status: Full code  Family Communication:  None today.  I spoke with the patient's mother on the phone and updated her about the clinical condition of the patient and answered all the questions yesterday  Status is: Inpatient  The patient is  inpatient because: IV treatments appropriate due to intensity of illness or inability to take PO, Inpatient level of care appropriate due to severity of illness and Closer monitoring of hemoglobin, IV fluids, GI intervention.  Dispo: The patient is from: Home              Anticipated d/c is to: Home              Anticipated d/c date is: 1-2 days              Patient currently is not medically stable to d/c.   Difficult to place patient No  Consultants:  GI  Procedures:  None yet  Anti-infectives:  . None  Anti-infectives (From admission, onward)   None     Subjective:  Today, patient was seen and examined at bedside.  Patient denies any nausea vomiting abdominal pain.  Getting prepared for endoscopy colonoscopy.  Moving his bowels.  Communicated in writing.  Objective: Vitals:   04/02/20 2041 04/03/20 0452  BP: (!) 156/83 (!) 165/85  Pulse: 75 75  Resp: 18 20  Temp: 98.1 F (36.7 C) 97.6 F (36.4 C)  SpO2: 99% 98%    Intake/Output Summary  (Last 24 hours) at 04/03/2020 1037 Last data filed at 04/02/2020 2100 Gross per 24 hour  Intake 360 ml  Output 400 ml  Net -40 ml   Filed Weights   04/01/20 1058  Weight: 63.5 kg   Body mass index is 20.67 kg/m.   Physical Exam:  GENERAL: Patient is alert awake, deaf but able to read and respond.   Thinly built. HENT: Mild pallor noted.  Pupils equally reactive to light. Oral mucosa is moist NECK: is supple, no gross swelling noted. CHEST: Clear to auscultation. No crackles or wheezes.  Diminished breath sounds bilaterally. CVS: S1 and S2 heard, no murmur. Regular rate and rhythm.  ABDOMEN: Soft, non-tender, bowel sounds are present. EXTREMITIES: No edema. CNS: Cranial nerves are intact. No focal motor deficits. SKIN: warm and dry without rashes.  Data Review: I have personally reviewed the following laboratory data and studies,  CBC: Recent Labs  Lab 04/01/20 1304 04/02/20 0351 04/03/20 0339  WBC 4.7 3.0* 2.4*  NEUTROABS 3.5 2.0 1.6*  HGB 13.9 10.8* 10.5*  HCT 44.9 35.6* 34.2*  MCV 84.7 86.6 85.9  PLT 153 101* 89*   Basic Metabolic Panel: Recent Labs  Lab 04/01/20 1304 04/02/20 0351 04/03/20 0339  NA 139 143 142  K 3.2* 3.5 3.6  CL 104 113* 113*  CO2 25 23 22   GLUCOSE 99 88 84  BUN 12 10 7   CREATININE 2.55* 1.97* 1.60*  CALCIUM 9.0 8.4* 8.5*  MG  --  2.1 2.1  PHOS  --  2.9 2.2*   Liver Function Tests: Recent Labs  Lab 04/01/20 1304 04/02/20 0351 04/03/20 0339  AST 23 15 16   ALT 21 13 14   ALKPHOS 64 47 51  BILITOT 0.6 0.2* 0.7  PROT 8.0 5.8* 5.9*  ALBUMIN 4.0 3.0* 3.1*   No results for input(s): LIPASE, AMYLASE in the last 168 hours. No results for input(s): AMMONIA in the last 168 hours. Cardiac Enzymes: No results for input(s): CKTOTAL, CKMB, CKMBINDEX, TROPONINI in the last 168 hours. BNP (last 3 results) No results for input(s): BNP in the last 8760 hours.  ProBNP (last 3 results) No results for input(s): PROBNP in the last 8760  hours.  CBG: No results for input(s): GLUCAP in the last 168 hours. Recent Results (from the past 240 hour(s))  Novel Coronavirus, NAA ([facility])     Status: Abnormal   Collection Time: 03/30/20 10:44 AM   Specimen: Nasopharyngeal(NP) swabs in vial transport medium   Nasopharynge  Result Value Ref Range Status   SARS-CoV-2, NAA Detected (A) Not Detected Final    Comment: Patients who have a positive COVID-19 test result may now have treatment options. Treatment options are available for patients with mild to moderate symptoms and for hospitalized patients. Visit our website at [email or web address] for resources and information. This nucleic acid amplification test was developed and its performance characteristics determined by [employer]. Nucleic acid amplification  tests include RT-PCR and TMA. This test has not been FDA cleared or approved. This test has been authorized by FDA under an Emergency Use Authorization (EUA). This test is only authorized for the duration of time the declaration that circumstances exist justifying the authorization of the emergency use of in vitro diagnostic tests for detection of SARS-CoV-2 virus and/or diagnosis of COVID-19 infection under section 564(b)(1) of the Act, 21 U.S.C. 941DEY-8(X) (1), unless the authorization is terminated or revoked sooner. When diagnostic testing is negativ e, the possibility of a false negative result should be considered in the context of a patient's recent exposures and the presence of clinical signs and symptoms consistent with COVID-19. An individual without symptoms of COVID-19 and who is not shedding SARS-CoV-2 virus would expect to have a negative (not detected) result in this assay.   SARS-COV-2, NAA 2 DAY TAT     Status: None   Collection Time: 03/30/20 10:44 AM   Nasopharynge  Result Value Ref Range Status   SARS-CoV-2, NAA 2 DAY TAT Performed  Final     Studies: US RENAL  Result  Date: April 22, 2020 CLINICAL DATA:  Acute kidney injury EXAM: RENAL / URINARY TRACT ULTRASOUND COMPLETE COMPARISON:  None FINDINGS: Right Kidney: Renal measurements: 10.1 x 4.5 x 4.9 cm = volume: 118 mL. Cortical thinning. Borderline increased cortical echogenicity. No mass, hydronephrosis, or shadowing calcification. Left Kidney: Renal measurements: 10.7 x 4.4 x 4.8 cm = volume: 119 mL. Cortical thinning. Increased cortical echogenicity. Small cysts LEFT kidney largest 2.2 x 2.3 x 2.0 cm, simple in character. No additional mass, hydronephrosis, or shadowing calcification. Nonspecific 5 mm nonshadowing echogenic focus mid LEFT kidney, could represent vascular calcification, a nonobstructing calculus, or artifact. Bladder: Appears normal for degree of bladder distention. Other: Prostatic enlargement, gland measuring 3.8 x 3.1 x 4.2 cm (volume = 26 cm^3) IMPRESSION: Prostatic enlargement. BILATERAL renal cortical atrophy and medical renal disease changes. Small LEFT renal cysts, largest 2.3 cm diameter. No evidence of solid renal mass or hydronephrosis. Electronically Signed   By: Lavonia Dana M.D.   On: 04-22-20 15:01   DG Chest Portable 1 View  Result Date: 04/22/2020 CLINICAL DATA:  55 year old male with COVID EXAM: PORTABLE CHEST 1 VIEW COMPARISON:  10/30/2019 FINDINGS: Cardiomediastinal silhouette unchanged in size and contour. No evidence of central vascular congestion. No pneumothorax or pleural effusion. No confluent airspace disease. No displaced fracture IMPRESSION: Negative for acute cardiopulmonary disease Electronically Signed   By: Corrie Mckusick D.O.   On: 04-22-2020 12:04      Flora Lipps, MD  [facility] 04/03/2020  If 7PM-7AM, please contact night-coverage

## 2020-04-04 DIAGNOSIS — R197 Diarrhea, unspecified: Secondary | ICD-10-CM | POA: Diagnosis not present

## 2020-04-04 DIAGNOSIS — K625 Hemorrhage of anus and rectum: Secondary | ICD-10-CM

## 2020-04-04 DIAGNOSIS — K649 Unspecified hemorrhoids: Secondary | ICD-10-CM

## 2020-04-04 DIAGNOSIS — N179 Acute kidney failure, unspecified: Secondary | ICD-10-CM | POA: Diagnosis not present

## 2020-04-04 DIAGNOSIS — U071 COVID-19: Secondary | ICD-10-CM | POA: Diagnosis not present

## 2020-04-04 LAB — CBC WITH DIFFERENTIAL/PLATELET
Abs Immature Granulocytes: 0.01 10*3/uL (ref 0.00–0.07)
Basophils Absolute: 0 10*3/uL (ref 0.0–0.1)
Basophils Relative: 0 %
Eosinophils Absolute: 0 10*3/uL (ref 0.0–0.5)
Eosinophils Relative: 1 %
HCT: 30.8 % — ABNORMAL LOW (ref 39.0–52.0)
Hemoglobin: 9.5 g/dL — ABNORMAL LOW (ref 13.0–17.0)
Immature Granulocytes: 0 %
Lymphocytes Relative: 13 %
Lymphs Abs: 0.4 10*3/uL — ABNORMAL LOW (ref 0.7–4.0)
MCH: 26.4 pg (ref 26.0–34.0)
MCHC: 30.8 g/dL (ref 30.0–36.0)
MCV: 85.6 fL (ref 80.0–100.0)
Monocytes Absolute: 0.3 10*3/uL (ref 0.1–1.0)
Monocytes Relative: 9 %
Neutro Abs: 2.2 10*3/uL (ref 1.7–7.7)
Neutrophils Relative %: 77 %
Platelets: 86 10*3/uL — ABNORMAL LOW (ref 150–400)
RBC: 3.6 MIL/uL — ABNORMAL LOW (ref 4.22–5.81)
RDW: 13.8 % (ref 11.5–15.5)
WBC: 2.8 10*3/uL — ABNORMAL LOW (ref 4.0–10.5)
nRBC: 0 % (ref 0.0–0.2)

## 2020-04-04 LAB — COMPREHENSIVE METABOLIC PANEL
ALT: 12 U/L (ref 0–44)
AST: 16 U/L (ref 15–41)
Albumin: 3.1 g/dL — ABNORMAL LOW (ref 3.5–5.0)
Alkaline Phosphatase: 50 U/L (ref 38–126)
Anion gap: 9 (ref 5–15)
BUN: 9 mg/dL (ref 6–20)
CO2: 24 mmol/L (ref 22–32)
Calcium: 8.5 mg/dL — ABNORMAL LOW (ref 8.9–10.3)
Chloride: 109 mmol/L (ref 98–111)
Creatinine, Ser: 1.64 mg/dL — ABNORMAL HIGH (ref 0.61–1.24)
GFR, Estimated: 49 mL/min — ABNORMAL LOW (ref >60)
Glucose, Bld: 82 mg/dL (ref 70–99)
Potassium: 3.4 mmol/L — ABNORMAL LOW (ref 3.5–5.1)
Sodium: 142 mmol/L (ref 135–145)
Total Bilirubin: 0.9 mg/dL (ref 0.3–1.2)
Total Protein: 5.9 g/dL — ABNORMAL LOW (ref 6.5–8.1)

## 2020-04-04 LAB — FERRITIN: Ferritin: 16 ng/mL — ABNORMAL LOW (ref 24–336)

## 2020-04-04 LAB — PHOSPHORUS: Phosphorus: 3.3 mg/dL (ref 2.5–4.6)

## 2020-04-04 LAB — SURGICAL PATHOLOGY

## 2020-04-04 LAB — D-DIMER, QUANTITATIVE: D-Dimer, Quant: 0.87 ug/mL-FEU — ABNORMAL HIGH (ref 0.00–0.50)

## 2020-04-04 LAB — C-REACTIVE PROTEIN: CRP: 1.9 mg/dL — ABNORMAL HIGH (ref <1.0)

## 2020-04-04 LAB — MAGNESIUM: Magnesium: 1.9 mg/dL (ref 1.7–2.4)

## 2020-04-04 MED ORDER — DOCUSATE SODIUM 100 MG PO CAPS: 100.0000 mg | ORAL_CAPSULE | Freq: Every day | ORAL | 2 refills | Status: AC

## 2020-04-04 MED ORDER — ALBUTEROL SULFATE HFA 108 (90 BASE) MCG/ACT IN AERS: 2.0000 | INHALATION_SPRAY | Freq: Three times a day (TID) | RESPIRATORY_TRACT | 1 refills | Status: DC | PRN

## 2020-04-04 MED ORDER — POTASSIUM CHLORIDE CRYS ER 20 MEQ PO TBCR
40.0000 meq | EXTENDED_RELEASE_TABLET | Freq: Once | ORAL | Status: AC
  Administered 2020-04-04: 40 meq via ORAL
  Filled 2020-04-04: qty 2

## 2020-04-04 MED ORDER — HYDROCORTISONE ACETATE 25 MG RE SUPP: 25.0000 mg | Freq: Two times a day (BID) | RECTAL | 0 refills | Status: DC

## 2020-04-04 NOTE — TOC Transition Note (Signed)
Transition of Care [facility]) - CM/SW Discharge Note   Patient Details  Name: Damon [name]. MRN: [id number] Date of Birth: 1965-04-09  Transition [facility]) CM/SW Contact:  Trish Mage, LCSW Phone Number: 04/04/2020, 11:15 AM   Clinical Narrative:  Followed up with patient's mother re PT recommendation of [facility].  She states she is caregiver at home,  has been observing him here in the hospital and he is at baseline.  She declines HH PT.  States he has lived with she and husband always, that he is ambulatory and takes care of all ADLs and cleaning of his rooms.  They have DME cane, walker, bsc and wheelchair, and have no further needs in that area either. TOC will continue to follow during the course of hospitalization.      Final next level of care: Home/Self Care Barriers to Discharge: No Barriers Identified   Patient Goals and CMS Choice        Discharge Placement                       Discharge Plan and Services                                     Social Determinants of Health (SDOH) Interventions     Readmission Risk Interventions No flowsheet data found.

## 2020-04-04 NOTE — Discharge Summary (Signed)
Physician Discharge Summary  Damon R EffinghamDurham Jr. ZHY:[id number] DOB: 1965/09/11 DOA: 04/01/2020  PCP: Mayer MaskerAbonza, Maritza, PA-C  Admit date: 04/01/2020 Discharge date: 04/04/2020  Admitted From: Home  Discharge disposition: Home  Recommendations for Outpatient Follow-Up:   . Follow up with your primary care provider in one week.  . Check CBC, BMP, magnesium in the next visit . 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  Will need outpatient follow-up . Patient had colonoscopy with biopsy.  GI clinic to follow-up with the patient. . Patient was noted to have hemorrhoids.  Will need  stool softeners to prevent constipation.  Discharge Diagnosis:   Principal Problem:   AKI (acute kidney injury) (HCC) Active Problems:   Hypothyroidism   Seizure disorder (HCC)   COVID-19 virus infection   Bloody diarrhea   GI bleed   Hemorrhoids   Discharge Condition: Improved.  Diet recommendation:   Regular.  Wound care: None.  Code status: Full.   History of Present Illness:   Damon R Binnie KandDurham Jr. is a 55 y.o. male with medical history significant of TBI, deafness at baseline, seizure disorder, hypothyroidism, presented to the  hospital with worsening weakness, bloody diarrhea and weight loss.  There was report of around 20 pound weight loss in 1 to 2 months ago with significantly decreased oral intake.  Patient was tested for Covid 2 days back which was positive.  Patient lives with his mother who also reported on-and-off bloody diarrhea for the last 2 months.  In the ED, patient was found to have AKI.  Creatinine 2.5 compared to baseline less than 1.  Chest x-ray clear, no significant infiltrates, no hypoxia.  Patient was then admitted hospital for generalized weakness acute kidney injury bloody diarrhea and COVID-19 infection.   Hospital Course:   Following conditions were addressed during hospitalization as listed below,  Acute kidney injury likely secondary to  poor oral intake, bloody diarrhea. Patient received IV hydration with improvement in renal function.  Renal ultrasound shows prostatic enlargement with cortical atrophy and medical renal disease no hydronephrosis noted.  Patient presented with creatinine of 2.5 compared to baseline less than 1.  Creatinine prior to discharge was 1.6.  Will need to monitor BMP as outpatient.   Bloody bowel movements. Seen by GI Dr. Elnoria HowardHung.   History of colonoscopy in the past.    Patient was seen by GI and underwent EGD/ colonoscopy on 04/03/2018.    Hemoglobin was 9.5 prior to discharge.  Colonoscopy revealed hemorrhoids.  Biopsy was done from the colon to rule out colitis.  Spoke with GI prior to discharge.  Patient is stable for disposition home.  GI to call the patient with biopsy report.  COVID-19 infection No pneumonia or hypoxia.  Likely incidental infection.   Inflammatory markers minimally elevated  Hypothyroidism -Continue Synthroid   Seizure disorder -Continue Keppra on discharge.   Mild hypokalemia.  Replenished.   Mild hypophosphatemia.    Replenished and improved.  Phosphorus of 3.3.  Disposition.  At this time, patient is stable for disposition home.  Spoke with GI prior to discharge.  Medical Consultants:    Gastroenterology  Procedures:    EGD/colonoscopy on 04/03/2020 Subjective:   Today, patient was seen and examined at bedside.  Denies any nausea vomiting, abdominal pain did not have further bloody bowel movement.  Discharge Exam:   Vitals:   04/04/20 0635 04/04/20 1401  BP: 120/74 139/81  Pulse: 65 70  Resp: 16 16  Temp: 98 F (  36.7 C) 98.1 F (36.7 C)  SpO2: 99% 100%   Vitals:   04/03/20 1505 04/03/20 2007 04/04/20 0635 04/04/20 1401  BP: (!) 153/50 (!) 141/62 120/74 139/81  Pulse: 68 95 65 70  Resp: 18 20 16 16   Temp: (!) 97.1 F (36.2 C) 97.9 F (36.6 C) 98 F (36.7 C) 98.1 F (36.7 C)  TempSrc: Oral Oral Oral Oral  SpO2: 100% 100% 99% 100%  Weight:       Height:       General: Alert awake, not in obvious distress, deaf, able to read and respond.  Thinly built. HENT: pupils equally reacting to light,  Mild pallor noted.  Oral mucosa is moist.  Chest:  Clear breath sounds.  Diminished breath sounds bilaterally. No crackles or wheezes.  CVS: S1 &S2 heard. No murmur.  Regular rate and rhythm. Abdomen: Soft, nontender, nondistended.  Bowel sounds are heard.   Extremities: No cyanosis, clubbing or edema.  Peripheral pulses are palpable. Psych: Alert, awake and oriented, normal mood CNS:  No cranial nerve deficits.  Power equal in all extremities.   Skin: Warm and dry.  No rashes noted.  The results of significant diagnostics from this hospitalization (including imaging, microbiology, ancillary and laboratory) are listed below for reference.     Diagnostic Studies:   US RENAL  Result Date: 04/01/2020 CLINICAL DATA:  Acute kidney injury EXAM: RENAL / URINARY TRACT ULTRASOUND COMPLETE COMPARISON:  None FINDINGS: Right Kidney: Renal measurements: 10.1 x 4.5 x 4.9 cm = volume: 118 mL. Cortical thinning. Borderline increased cortical echogenicity. No mass, hydronephrosis, or shadowing calcification. Left Kidney: Renal measurements: 10.7 x 4.4 x 4.8 cm = volume: 119 mL. Cortical thinning. Increased cortical echogenicity. Small cysts LEFT kidney largest 2.2 x 2.3 x 2.0 cm, simple in character. No additional mass, hydronephrosis, or shadowing calcification. Nonspecific 5 mm nonshadowing echogenic focus mid LEFT kidney, could represent vascular calcification, a nonobstructing calculus, or artifact. Bladder: Appears normal for degree of bladder distention. Other: Prostatic enlargement, gland measuring 3.8 x 3.1 x 4.2 cm (volume = 26 cm^3) IMPRESSION: Prostatic enlargement. BILATERAL renal cortical atrophy and medical renal disease changes. Small LEFT renal cysts, largest 2.3 cm diameter. No evidence of solid renal mass or hydronephrosis. Electronically Signed    By: Lavonia Dana M.D.   On: 04/01/2020 15:01   DG Chest Portable 1 View  Result Date: 04/01/2020 CLINICAL DATA:  55 year old male with COVID EXAM: PORTABLE CHEST 1 VIEW COMPARISON:  10/30/2019 FINDINGS: Cardiomediastinal silhouette unchanged in size and contour. No evidence of central vascular congestion. No pneumothorax or pleural effusion. No confluent airspace disease. No displaced fracture IMPRESSION: Negative for acute cardiopulmonary disease Electronically Signed   By: Corrie Mckusick D.O.   On: 04/01/2020 12:04     Labs:   Basic Metabolic Panel: Recent Labs  Lab 04/01/20 1304 04/02/20 0351 04/03/20 0339 04/04/20 0354  NA 139 143 142 142  K 3.2* 3.5 3.6 3.4*  CL 104 113* 113* 109  CO2 25 23 22 24   GLUCOSE 99 88 84 82  BUN 12 10 7 9   CREATININE 2.55* 1.97* 1.60* 1.64*  CALCIUM 9.0 8.4* 8.5* 8.5*  MG  --  2.1 2.1 1.9  PHOS  --  2.9 2.2* 3.3   GFR Estimated Creatinine Clearance: 45.7 mL/min (A) (by C-G formula based on SCr of 1.64 mg/dL (H)). Liver Function Tests: Recent Labs  Lab 04/01/20 1304 04/02/20 0351 04/03/20 0339 04/04/20 0354  AST 23 15 16 16   ALT 21  13 14 12   ALKPHOS 64 47 51 50  BILITOT 0.6 0.2* 0.7 0.9  PROT 8.0 5.8* 5.9* 5.9*  ALBUMIN 4.0 3.0* 3.1* 3.1*   No results for input(s): LIPASE, AMYLASE in the last 168 hours. No results for input(s): AMMONIA in the last 168 hours. Coagulation profile No results for input(s): INR, PROTIME in the last 168 hours.  CBC: Recent Labs  Lab 04/01/20 1304 04/02/20 0351 04/03/20 0339 04/04/20 0354  WBC 4.7 3.0* 2.4* 2.8*  NEUTROABS 3.5 2.0 1.6* 2.2  HGB 13.9 10.8* 10.5* 9.5*  HCT 44.9 35.6* 34.2* 30.8*  MCV 84.7 86.6 85.9 85.6  PLT 153 101* 89* 86*   Cardiac Enzymes: No results for input(s): CKTOTAL, CKMB, CKMBINDEX, TROPONINI in the last 168 hours. BNP: Invalid input(s): POCBNP CBG: Recent Labs  Lab 04/03/20 2226  GLUCAP 73   D-Dimer Recent Labs    04/03/20 0339 04/04/20 0354  DDIMER 0.50  0.87*   Hgb A1c No results for input(s): HGBA1C in the last 72 hours. Lipid Profile No results for input(s): CHOL, HDL, LDLCALC, TRIG, CHOLHDL, LDLDIRECT in the last 72 hours. Thyroid function studies No results for input(s): TSH, T4TOTAL, T3FREE, THYROIDAB in the last 72 hours.  Invalid input(s): FREET3 Anemia work up Recent Labs    04/03/20 0339 04/04/20 0354  FERRITIN 14* 16*   Microbiology Recent Results (from the past 240 hour(s))  Novel Coronavirus, NAA ([facility])     Status: Abnormal   Collection Time: 03/30/20 10:44 AM   Specimen: Nasopharyngeal(NP) swabs in vial transport medium   Nasopharynge  Result Value Ref Range Status   SARS-CoV-2, NAA Detected (A) Not Detected Final    Comment: Patients who have a positive COVID-19 test result may now have treatment options. Treatment options are available for patients with mild to moderate symptoms and for hospitalized patients. Visit our website at [email or web address] for resources and information. This nucleic acid amplification test was developed and its performance characteristics determined by [employer]. Nucleic acid amplification tests include RT-PCR and TMA. This test has not been FDA cleared or approved. This test has been authorized by FDA under an Emergency Use Authorization (EUA). This test is only authorized for the duration of time the declaration that circumstances exist justifying the authorization of the emergency use of in vitro diagnostic tests for detection of SARS-CoV-2 virus and/or diagnosis of COVID-19 infection under section 564(b)(1) of the Act, 21 U.S.C. 469GEX-5(M) (1), unless the authorization is terminated or revoked sooner. When diagnostic testing is negativ e, the possibility of a false negative result should be considered in the context of a patient's recent exposures and the presence of clinical signs and symptoms consistent with COVID-19. An individual without symptoms  of COVID-19 and who is not shedding SARS-CoV-2 virus would expect to have a negative (not detected) result in this assay.   SARS-COV-2, NAA 2 DAY TAT     Status: None   Collection Time: 03/30/20 10:44 AM   Nasopharynge  Result Value Ref Range Status   SARS-CoV-2, NAA 2 DAY TAT Performed  Final     Discharge Instructions:   Discharge Instructions     Call MD for:  persistant nausea and vomiting   Complete by: As directed    Call MD for:  severe uncontrolled pain   Complete by: As directed    Call MD for:  temperature >100.4   Complete by: As directed    Diet general   Complete by: As directed    Soft diet.  Discharge instructions   Complete by: As directed    Follow-up with your primary care provider in 1 week.  GI office to call you with the report from colon biopsy.  Continue to use hydrocortisone at home.  Seek medical attention for worsening symptoms.  Use stool softeners every day.   Increase activity slowly   Complete by: As directed       Allergies as of 04/04/2020       Reactions   Bactrim [sulfamethoxazole-trimethoprim] Rash        Medication List     TAKE these medications    albuterol 108 (90 Base) MCG/ACT inhaler Commonly known as: VENTOLIN HFA Inhale 2 puffs into the lungs every 8 (eight) hours as needed for wheezing or shortness of breath.   [employer] VIT D 5000 HIGH-POTENCY PO Take 1 tablet by mouth daily.   docusate sodium 100 MG capsule Commonly known as: Colace Take 1 capsule (100 mg total) by mouth daily.   famotidine 20 MG tablet Commonly known as: PEPCID Take 1 tablet (20 mg total) by mouth daily.   FLUoxetine 20 MG capsule Commonly known as: PROZAC TAKE 1 CAPSULE(20 MG) BY MOUTH DAILY What changed: See the new instructions.   hydrocortisone 25 MG suppository Commonly known as: ANUSOL-HC Place 1 suppository (25 mg total) rectally 2 (two) times daily.   levETIRAcetam 1000 MG tablet Commonly known as: Keppra Take 1 tablet (1,000 mg  total) by mouth 2 (two) times daily.   levocetirizine 5 MG tablet Commonly known as: XYZAL TAKE 1 TABLET(5 MG) BY MOUTH EVERY EVENING What changed: See the new instructions.   levothyroxine 75 MCG tablet Commonly known as: SYNTHROID Take 1 tablet (75 mcg total) by mouth daily before breakfast. (Takes with 12.38mcg to on [place] to equal a total dose of 87.75mcg) What changed: additional instructions   levothyroxine 25 MCG tablet Commonly known as: Synthroid TAKE 1/2 TABLET ON TUESDAYS AND FRIDAYS(IN ADDITION TO YOUR 75MCG LEVOTHYROXINE TABLETS) What changed:   how much to take  how to take this  when to take this   pantoprazole 20 MG tablet Commonly known as: Protonix Take 1 tablet (20 mg total) by mouth 2 (two) times daily before a meal.   rosuvastatin 20 MG tablet Commonly known as: CRESTOR TAKE 1 TABLET(20 MG) BY MOUTH AT BEDTIME What changed: See the new instructions.   Vitamin D (Ergocalciferol) 1.25 MG (50000 UNIT) Caps capsule Commonly known as: DRISDOL Take one tablet wkly What changed:   how much to take  how to take this  when to take this  additional instructions        Follow-up Information     [name], PA-C. Schedule an appointment as soon as possible for a visit in 1 week(s).   Specialty: Physician Assistant Why: regular followup, blood work checkup Contact information: [address] [place]. [place] [address] [phone]         [facility] Follow up.   Specialty: Gastroenterology Why: office to call you with biopsy Contact information: [address] [place] [phone]                 Time coordinating discharge: 39 minutes  Signed:  Brinsley Wence  [facility] 04/04/2020, 3:00 PM

## 2020-04-04 NOTE — Plan of Care (Signed)

## 2020-04-06 ENCOUNTER — Other Ambulatory Visit: Payer: Self-pay

## 2020-04-06 NOTE — Patient Outreach (Signed)
[facility]) Care Management  04/06/2020  [place]. 1965-05-03 [id number]     Transition of Care Referral  Referral Date: 04/06/2020   Referral Source: [facility] Discharge Report Date of Discharge: 04/04/2020 Facility: [place]: [facility]   Referral received. Transition of care calls being completed via EMMI-automated calls. RN CM will outreach patient for any red flags received.     Plan: RN CM will close case at this time.   Enzo Montgomery, RN,BSN,CCM [place] Management Telephonic Care Management Coordinator Direct Phone: [phone] Toll Free: [phone] Fax: [phone]

## 2020-04-10 ENCOUNTER — Other Ambulatory Visit: Payer: Self-pay

## 2020-04-10 ENCOUNTER — Telehealth: Payer: Self-pay | Admitting: Physician Assistant

## 2020-04-10 NOTE — Patient Outreach (Signed)
[facility]) Care Management  04/10/2020  [place]. 21-Jan-1966 [id number]    EMMI-General Discharge RED ON EMMI ALERT Day # 4 Date: 04/09/2020 Red Alert Reason: " Know who to call about changes in condition? No"   Outreach attempt #1 to patient. No answer. RN CM left HIPAA compliant voicemail message along with contact info.     Plan: RN CM will make outreach attempt to patient within 3-4 business days. RN CM will send unsuccessful outreach letter to patient.  Enzo Montgomery, RN,BSN,CCM [place] Management Telephonic Care Management Coordinator Direct Phone: [phone] Toll Free: [phone] Fax: [phone]

## 2020-04-10 NOTE — Telephone Encounter (Signed)
[name] called in stating after his colonoscopy he is still bleeding from his rectum. It looks like the colonoscopy was on 04-03-20. On 04-01-20 patient was in ED for acute kidney injury, hemorrhoids, and rectal bleeding. Patient has had hemorrhoids as well. Patient's potassium level is low, and his kidneys are functioning high. Please advise, thanks.

## 2020-04-10 NOTE — Patient Outreach (Signed)
[facility]) Care Management  04/10/2020  [place]. [birth year] [id number]    EMMI-General Discharge RED ON EMMI ALERT Day # 4 Date: 04/09/2020 Red Alert Reason: " Know who to call about changes in condition? No"  Incoming call from patient's mother-Jean. Patient is deaf and hearing impaired. She handles his medical affairs. Reviewed and addressed red alert. Mother is aware of how to contact MD office if needed. She reports she has already called PCP office this morning. She is awaiting return call. She reports that patient needs repeat lab work to see if his potassium level and kidney function has improved since discharge. She also voices that patient still having a little bit of bleeding from rectum. He was having this issue while hospitalized. He had colonoscopy and she is awaiting biopsy results. She reports he also had a very bad case of hemorrhoids"about the size of grapes." She reports that patient has had some loose stools since returning home and she noticed a little bleeding. She has not assessed rectal area for hemorrhoids but will do so. Mother inquiring about contact info for GI MD who saw patient in hospital to follow up on biopsy dn sxs. RNCM provided her with [facility] phone number per records. She is aware to seek medical attention for any worsening and/or unresolved sxs. She denies any other RN CM needs or concerns at this time. Patient has completed post discharge automated calls.    Plan: RN CM will close case at this time.  Enzo Montgomery, RN,BSN,CCM [place] Management Telephonic Care Management Coordinator Direct Phone: [phone] Toll Free: [phone] Fax: [phone]

## 2020-04-10 NOTE — Telephone Encounter (Signed)
Please call patient mother to schedule for 04/20/20 at 245pm for hospital follow up

## 2020-04-17 ENCOUNTER — Other Ambulatory Visit: Payer: Self-pay | Admitting: Physician Assistant

## 2020-04-19 DIAGNOSIS — H532 Diplopia: Secondary | ICD-10-CM | POA: Diagnosis not present

## 2020-04-19 DIAGNOSIS — H04562 Stenosis of left lacrimal punctum: Secondary | ICD-10-CM | POA: Diagnosis not present

## 2020-04-19 DIAGNOSIS — H40013 Open angle with borderline findings, low risk, bilateral: Secondary | ICD-10-CM | POA: Diagnosis not present

## 2020-04-19 DIAGNOSIS — H5021 Vertical strabismus, right eye: Secondary | ICD-10-CM | POA: Diagnosis not present

## 2020-04-19 DIAGNOSIS — H2513 Age-related nuclear cataract, bilateral: Secondary | ICD-10-CM | POA: Diagnosis not present

## 2020-04-20 ENCOUNTER — Encounter: Payer: Self-pay | Admitting: Nurse Practitioner

## 2020-04-20 ENCOUNTER — Ambulatory Visit: Payer: Medicare HMO | Admitting: Nurse Practitioner

## 2020-04-20 ENCOUNTER — Other Ambulatory Visit: Payer: Self-pay

## 2020-04-20 ENCOUNTER — Encounter: Payer: Self-pay | Admitting: Physician Assistant

## 2020-04-20 ENCOUNTER — Ambulatory Visit (INDEPENDENT_AMBULATORY_CARE_PROVIDER_SITE_OTHER): Payer: Medicare HMO | Admitting: Physician Assistant

## 2020-04-20 ENCOUNTER — Inpatient Hospital Stay: Payer: Medicare HMO | Admitting: Physician Assistant

## 2020-04-20 VITALS — BP 122/76 | HR 93 | Ht 68.0 in | Wt 132.0 lb

## 2020-04-20 VITALS — BP 110/70 | HR 79 | Temp 97.4°F | Ht 69.0 in | Wt 130.4 lb

## 2020-04-20 DIAGNOSIS — Z0000 Encounter for general adult medical examination without abnormal findings: Secondary | ICD-10-CM | POA: Diagnosis not present

## 2020-04-20 DIAGNOSIS — K649 Unspecified hemorrhoids: Secondary | ICD-10-CM

## 2020-04-20 DIAGNOSIS — K625 Hemorrhage of anus and rectum: Secondary | ICD-10-CM | POA: Diagnosis not present

## 2020-04-20 DIAGNOSIS — N179 Acute kidney failure, unspecified: Secondary | ICD-10-CM

## 2020-04-20 DIAGNOSIS — I10 Essential (primary) hypertension: Secondary | ICD-10-CM

## 2020-04-20 DIAGNOSIS — R197 Diarrhea, unspecified: Secondary | ICD-10-CM | POA: Diagnosis not present

## 2020-04-20 DIAGNOSIS — K580 Irritable bowel syndrome with diarrhea: Secondary | ICD-10-CM | POA: Diagnosis not present

## 2020-04-20 DIAGNOSIS — R630 Anorexia: Secondary | ICD-10-CM | POA: Diagnosis not present

## 2020-04-20 DIAGNOSIS — F32A Depression, unspecified: Secondary | ICD-10-CM

## 2020-04-20 DIAGNOSIS — U071 COVID-19: Secondary | ICD-10-CM

## 2020-04-20 DIAGNOSIS — G40909 Epilepsy, unspecified, not intractable, without status epilepticus: Secondary | ICD-10-CM

## 2020-04-20 DIAGNOSIS — R5383 Other fatigue: Secondary | ICD-10-CM | POA: Diagnosis not present

## 2020-04-20 DIAGNOSIS — E785 Hyperlipidemia, unspecified: Secondary | ICD-10-CM

## 2020-04-20 DIAGNOSIS — Z09 Encounter for follow-up examination after completed treatment for conditions other than malignant neoplasm: Secondary | ICD-10-CM

## 2020-04-20 MED ORDER — MIRTAZAPINE 7.5 MG PO TABS: 7.5000 mg | ORAL_TABLET | Freq: Every day | ORAL | 0 refills | Status: DC

## 2020-04-20 MED ORDER — HYDROCORTISONE ACETATE 25 MG RE SUPP: RECTAL | 0 refills | Status: DC

## 2020-04-20 MED ORDER — PANTOPRAZOLE SODIUM 40 MG PO TBEC: 40.0000 mg | DELAYED_RELEASE_TABLET | Freq: Two times a day (BID) | ORAL | 1 refills | Status: DC
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
## 2020-04-20 NOTE — Patient Instructions (Signed)
Start taking Prozac 20 mg every other day x 1 week and then take 20 mg every third day x 1 week. Start taking Mirtazapine when you start taking Prozac every third day.  Protein-Energy Malnutrition Protein-energy malnutrition is when a person does not eat enough protein, fat, and calories. When this happens over time, it can lead to severe loss of muscle tissue (muscle wasting). This condition also affects the body's defense system (immune system) and can lead to other health problems. What are the causes? This condition may be caused by:  Not eating enough protein, fat, or calories.  Having certain chronic medical conditions.  Eating too little. What increases the risk? The following factors may make you more likely to develop this condition:  Living in poverty.  Long-term hospitalization.  Alcohol or drug dependency. Addiction often leads to a lifestyle in which proper diet is ignored. Dependency can also hurt the metabolism and the body's ability to absorb nutrients.  Eating disorders, such as anorexia nervosa or bulimia.  Chewing or swallowing problems. People with these disorders may not eat enough.  Having certain conditions, such as: ? Inflammatory bowel disease. Inflammation of the intestines makes it difficult for the body to absorb nutrients. ? Cancer or AIDS. These diseases can cause a loss of appetite. ? Chronic heart failure. This interferes with how the body uses nutrients. ? Cystic fibrosis. This disease can make it difficult for the body to absorb nutrients.  Eating a diet that extremely restricts protein, fat, or calorie intake. What are the signs or symptoms? Symptoms of this condition include:  Fatigue.  Weakness.  Dizziness.  Fainting.  Weight loss.  Loss of muscle tone and muscle mass.  Poor immune response.  Lack of menstruation.  Poor memory.  Hair loss.  Skin changes. How is this diagnosed? This condition may be diagnosed based  on:  Your medical and dietary history.  A physical exam. This may include a measurement of your body mass index (BMI).  Blood tests. How is this treated? This condition may be managed with:  Nutrition therapy. This may include working with a diet and nutrition specialist (dietitian).  Treatment for underlying conditions. People with severe protein-energy malnutrition may need to be treated in a hospital. This may involve receiving nutrition and fluids through an IV. Follow these instructions at home:  Eat a balanced diet. In each meal, include at least one food that is high in protein. Foods that are high in protein include: ? Meat. ? Poultry. ? Fish. ? Eggs. ? Cheese. ? Milk. ? Beans. ? Nuts.  Eat nutrient-rich foods that are easy to swallow and digest, such as: ? Fruit and yogurt smoothies. ? Oatmeal with nut butter.  Try to eat six small meals each day instead of three large meals.  Take vitamin and protein supplements as told by your health care provider or dietitian.  Follow your health care provider's recommendations about exercise and activity.  Keep all follow-up visits as told by your health care provider. This is important.   Contact a health care provider if you:  Have increased weakness or fatigue.  Faint.  Are a woman and you stop having your period (menstruating).  Have rapid hair loss.  Have unexpected weight loss.  Have diarrhea.  Have nausea and vomiting. Get help right away if you have:  Difficulty breathing.  Chest pain. Summary  Protein-energy malnutrition is when a person does not eat enough protein, fat, and calories.  Protein-energy malnutrition can lead to  severe loss of muscle tissue (muscle wasting). This condition also affects the body's defense system (immune system) and can lead to other health problems.  Talk with your health care provider about treatment for this condition. Effective treatment depends on the underlying  cause of the malnutrition. This information is not intended to replace advice given to you by your health care provider. Make sure you discuss any questions you have with your health care provider. Document Revised: 03/05/2017 Document Reviewed: 03/05/2017 Elsevier Patient Education  2021 [employer].

## 2020-04-20 NOTE — Patient Instructions (Addendum)
If you are age 56 or younger, your body mass index should be between 19-25. Your Body mass index is 20.07 kg/m. If this is out of the aformentioned range listed, please consider follow up with your Primary Care Provider.   MEDICATION  We have sent the following medication to your pharmacy for you to pick up at your convenience:  Pantoprazole 40 MG twice a day.  Anusol rectal suppository, use one at night for 7 nights.  OVER THE COUNTER MEDICATION  Please purchase the following medications over the counter and take as directed:  Desitin: Apply a small amount to the external anal area three times a day as needed.  We have you scheduled to follow up with Dr. Henrene Pastor 06/02/20 at 3:20 pm.  Please have your primary care doctor order the following labs:  CBC BMP Ion,Iron sat,TBIC Ferritin  Please call our office if your symptoms worsen.  It was great seeing you today!  Thank you for entrusting me with your care and choosing [facility].  Noralyn Pick, CRNP
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
## 2020-04-21 LAB — CBC
Hematocrit: 37.7 % (ref 37.5–51.0)
Hemoglobin: 11.8 g/dL — ABNORMAL LOW (ref 13.0–17.7)
MCH: 26.2 pg — ABNORMAL LOW (ref 26.6–33.0)
MCHC: 31.3 g/dL — ABNORMAL LOW (ref 31.5–35.7)
MCV: 84 fL (ref 79–97)
Platelets: 238 10*3/uL (ref 150–450)
RBC: 4.51 x10E6/uL (ref 4.14–5.80)
RDW: 13.5 % (ref 11.6–15.4)
WBC: 4.6 10*3/uL (ref 3.4–10.8)

## 2020-04-21 LAB — BASIC METABOLIC PANEL
BUN/Creatinine Ratio: 7 — ABNORMAL LOW (ref 9–20)
BUN: 8 mg/dL (ref 6–24)
CO2: 20 mmol/L (ref 20–29)
Calcium: 9.3 mg/dL (ref 8.7–10.2)
Chloride: 104 mmol/L (ref 96–106)
Creatinine, Ser: 1.16 mg/dL (ref 0.76–1.27)
GFR calc Af Amer: 81 mL/min/{1.73_m2} (ref >59)
GFR calc non Af Amer: 70 mL/min/{1.73_m2} (ref >59)
Glucose: 55 mg/dL — ABNORMAL LOW (ref 65–99)
Potassium: 4.2 mmol/L (ref 3.5–5.2)
Sodium: 142 mmol/L (ref 134–144)

## 2020-04-21 LAB — IRON AND TIBC
Iron Saturation: 12 % — ABNORMAL LOW (ref 15–55)
Iron: 41 ug/dL (ref 38–169)
Total Iron Binding Capacity: 348 ug/dL (ref 250–450)
UIBC: 307 ug/dL (ref 111–343)

## 2020-04-21 LAB — MAGNESIUM: Magnesium: 2.3 mg/dL (ref 1.6–2.3)

## 2020-04-21 LAB — CALCIUM, IONIZED: Calcium, Ion: 5.1 mg/dL (ref 4.5–5.6)

## 2020-04-21 LAB — FERRITIN: Ferritin: 13 ng/mL — ABNORMAL LOW (ref 30–400)

## 2020-04-21 NOTE — Progress Notes (Signed)
Office follow-up note reviewed.

## 2020-04-24 ENCOUNTER — Telehealth: Payer: Self-pay | Admitting: Physician Assistant

## 2020-04-24 ENCOUNTER — Other Ambulatory Visit: Payer: Self-pay | Admitting: Neurology

## 2020-04-24 NOTE — Telephone Encounter (Signed)
Patients mother states pt has not been taking Prozac and was wondering if they could start the mirtazapine. I advised they could as long as pt was not using prozac. Mother verbalized understanding. AS, CMA

## 2020-04-24 NOTE — Telephone Encounter (Signed)
See other note. AS, CMA

## 2020-04-24 NOTE — Telephone Encounter (Signed)
[name] called in wondering if and when patient has ever taken Prozac, it was not listed in his medication list. Patient will be starting mirtazapine for anxiety, patient has not started it yet. Please call mother back at [phone]. Thanks

## 2020-05-01 ENCOUNTER — Other Ambulatory Visit: Payer: Self-pay | Admitting: Neurology

## 2020-05-02 ENCOUNTER — Other Ambulatory Visit: Payer: Self-pay | Admitting: Neurology

## 2020-05-02 ENCOUNTER — Other Ambulatory Visit: Payer: Self-pay | Admitting: Physician Assistant

## 2020-05-02 NOTE — Telephone Encounter (Signed)
Will refill levetiracetam but the mirtazapine is prescribed by another provider

## 2020-05-02 NOTE — Telephone Encounter (Signed)
Patient's mom called to check on the status of the refill for mirtazapine 7.5mg . He only has 4 pills left.

## 2020-05-10 ENCOUNTER — Ambulatory Visit (INDEPENDENT_AMBULATORY_CARE_PROVIDER_SITE_OTHER): Payer: Medicare HMO | Admitting: Physician Assistant

## 2020-05-10 ENCOUNTER — Other Ambulatory Visit: Payer: Self-pay

## 2020-05-10 ENCOUNTER — Encounter: Payer: Self-pay | Admitting: Physician Assistant

## 2020-05-10 VITALS — BP 114/73 | HR 81 | Temp 97.3°F | Ht 69.0 in | Wt 132.7 lb

## 2020-05-10 DIAGNOSIS — E785 Hyperlipidemia, unspecified: Secondary | ICD-10-CM

## 2020-05-10 DIAGNOSIS — F32A Depression, unspecified: Secondary | ICD-10-CM | POA: Diagnosis not present

## 2020-05-10 DIAGNOSIS — I10 Essential (primary) hypertension: Secondary | ICD-10-CM | POA: Diagnosis not present

## 2020-05-10 DIAGNOSIS — E559 Vitamin D deficiency, unspecified: Secondary | ICD-10-CM

## 2020-05-10 DIAGNOSIS — R630 Anorexia: Secondary | ICD-10-CM | POA: Diagnosis not present

## 2020-05-10 DIAGNOSIS — E039 Hypothyroidism, unspecified: Secondary | ICD-10-CM

## 2020-05-10 NOTE — Patient Instructions (Signed)

## 2020-05-10 NOTE — Assessment & Plan Note (Signed)
-   Last lipid panel wnl's. - Continue current medication regimen. - Follow a heart healthy diet and stay as active as possible.  - Will repeat lipid panel and hepatic function today.

## 2020-05-10 NOTE — Assessment & Plan Note (Signed)
-  Last TSH wnl -Continue current medication regimen. -Rechecking TSH today. Pending results will make medication adjustments if indicated.  
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
## 2020-05-10 NOTE — Assessment & Plan Note (Signed)
-  Rechecking Vit D today. Pending results will make treatment adjustments if indicated.

## 2020-05-12 LAB — CBC
Hematocrit: 35.9 % — ABNORMAL LOW (ref 37.5–51.0)
Hemoglobin: 11.1 g/dL — ABNORMAL LOW (ref 13.0–17.7)
MCH: 25.3 pg — ABNORMAL LOW (ref 26.6–33.0)
MCHC: 30.9 g/dL — ABNORMAL LOW (ref 31.5–35.7)
MCV: 82 fL (ref 79–97)
Platelets: 190 10*3/uL (ref 150–450)
RBC: 4.39 x10E6/uL (ref 4.14–5.80)
RDW: 13.3 % (ref 11.6–15.4)
WBC: 5.7 10*3/uL (ref 3.4–10.8)

## 2020-05-12 LAB — COMPREHENSIVE METABOLIC PANEL
ALT: 60 IU/L — ABNORMAL HIGH (ref 0–44)
AST: 45 IU/L — ABNORMAL HIGH (ref 0–40)
Albumin/Globulin Ratio: 1.4 (ref 1.2–2.2)
Albumin: 4 g/dL (ref 3.8–4.9)
Alkaline Phosphatase: 73 IU/L (ref 44–121)
BUN/Creatinine Ratio: 8 — ABNORMAL LOW (ref 9–20)
BUN: 9 mg/dL (ref 6–24)
Bilirubin Total: 0.3 mg/dL (ref 0.0–1.2)
CO2: 24 mmol/L (ref 20–29)
Calcium: 9.4 mg/dL (ref 8.7–10.2)
Chloride: 106 mmol/L (ref 96–106)
Creatinine, Ser: 1.17 mg/dL (ref 0.76–1.27)
Globulin, Total: 2.8 g/dL (ref 1.5–4.5)
Glucose: 88 mg/dL (ref 65–99)
Potassium: 4.1 mmol/L (ref 3.5–5.2)
Sodium: 144 mmol/L (ref 134–144)
Total Protein: 6.8 g/dL (ref 6.0–8.5)
eGFR: 74 mL/min/{1.73_m2} (ref >59)

## 2020-05-12 LAB — TSH: TSH: 0.282 u[IU]/mL — ABNORMAL LOW (ref 0.450–4.500)

## 2020-05-12 LAB — LIPID PANEL
Chol/HDL Ratio: 4.7 ratio (ref 0.0–5.0)
Cholesterol, Total: 142 mg/dL (ref 100–199)
HDL: 30 mg/dL — ABNORMAL LOW (ref >39)
LDL Chol Calc (NIH): 87 mg/dL (ref 0–99)
Triglycerides: 138 mg/dL (ref 0–149)
VLDL Cholesterol Cal: 25 mg/dL (ref 5–40)

## 2020-05-12 LAB — VITAMIN D 25 HYDROXY (VIT D DEFICIENCY, FRACTURES): Vit D, 25-Hydroxy: 144 ng/mL — ABNORMAL HIGH (ref 30.0–100.0)

## 2020-05-23 ENCOUNTER — Other Ambulatory Visit: Payer: Self-pay | Admitting: Physician Assistant

## 2020-05-23 DIAGNOSIS — E785 Hyperlipidemia, unspecified: Secondary | ICD-10-CM

## 2020-05-23 DIAGNOSIS — E7841 Elevated Lipoprotein(a): Secondary | ICD-10-CM

## 2020-06-02 ENCOUNTER — Ambulatory Visit: Payer: Medicare HMO | Admitting: Internal Medicine

## 2020-06-02 ENCOUNTER — Encounter: Payer: Self-pay | Admitting: Internal Medicine

## 2020-06-02 VITALS — BP 126/80 | HR 88 | Ht 67.0 in | Wt 135.5 lb

## 2020-06-02 DIAGNOSIS — K625 Hemorrhage of anus and rectum: Secondary | ICD-10-CM | POA: Diagnosis not present

## 2020-06-02 DIAGNOSIS — K5900 Constipation, unspecified: Secondary | ICD-10-CM | POA: Diagnosis not present

## 2020-06-02 MED ORDER — AMBULATORY NON FORMULARY MEDICATION: 30 refills | Status: DC

## 2020-06-02 NOTE — Patient Instructions (Addendum)
It was a pleasure to see you today. Based on our discussion, I am providing you with my recommendations below:  RECOMMENDATION(S):   [facility]:  . The following medication has been prescribed:  o Nitroglycerin - please apply a small amount to an over the counter glycerin suppository and insert into rectum every evening o This medication MUST be compounded by a compounding pharmacy. Your prescription has been sent to:  [facility] Address: [address], [place], [place] [address]  [phone]  . Please DO NOT go directly from our office to pick up this medication! Give the pharmacy 1 day to process the prescription. Extra time is required for them to compound your medication.  OVER THE COUNTER MEDICATION(S):   Please purchase the following medications over the counter and take as directed:  Marland Kitchen Metamucil Tablets - please take 1 tablet by mouth 4-5 times per day . Rectal Suppositories - please insert 1 suppository into the rectum every evening.  BMI:  . If you are age 79 or younger, your body mass index should be between 19-25. Your There is no height or weight on file to calculate BMI. If this is out of the aformentioned range listed, please consider follow up with your Primary Care Provider.   Thank you for trusting me with your gastrointestinal care!    Scarlette Shorts, MD
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
## 2020-06-12 ENCOUNTER — Other Ambulatory Visit: Payer: Self-pay | Admitting: Physician Assistant

## 2020-06-12 DIAGNOSIS — F32A Depression, unspecified: Secondary | ICD-10-CM

## 2020-06-19 ENCOUNTER — Other Ambulatory Visit: Payer: Self-pay | Admitting: Physician Assistant

## 2020-06-19 DIAGNOSIS — E039 Hypothyroidism, unspecified: Secondary | ICD-10-CM

## 2020-06-27 ENCOUNTER — Other Ambulatory Visit: Payer: Medicare HMO

## 2020-07-04 ENCOUNTER — Other Ambulatory Visit: Payer: Medicare HMO

## 2020-07-04 ENCOUNTER — Other Ambulatory Visit: Payer: Self-pay

## 2020-07-04 DIAGNOSIS — E039 Hypothyroidism, unspecified: Secondary | ICD-10-CM | POA: Diagnosis not present

## 2020-07-05 LAB — T4, FREE: Free T4: 1.4 ng/dL (ref 0.82–1.77)

## 2020-07-05 LAB — T3: T3, Total: 154 ng/dL (ref 71–180)

## 2020-07-05 LAB — TSH: TSH: 0.576 u[IU]/mL (ref 0.450–4.500)

## 2020-07-17 ENCOUNTER — Other Ambulatory Visit: Payer: Self-pay | Admitting: Physician Assistant

## 2020-07-21 ENCOUNTER — Emergency Department (HOSPITAL_COMMUNITY)
Admission: EM | Admit: 2020-07-21 | Discharge: 2020-07-21 | Disposition: A | Discharge status: Home / Self Care | Payer: Medicare HMO | Attending: Emergency Medicine | Admitting: Emergency Medicine

## 2020-07-21 ENCOUNTER — Encounter (HOSPITAL_COMMUNITY): Payer: Self-pay | Admitting: Emergency Medicine

## 2020-07-21 ENCOUNTER — Emergency Department (HOSPITAL_COMMUNITY): Payer: Medicare HMO

## 2020-07-21 DIAGNOSIS — N132 Hydronephrosis with renal and ureteral calculous obstruction: Secondary | ICD-10-CM | POA: Insufficient documentation

## 2020-07-21 DIAGNOSIS — I10 Essential (primary) hypertension: Secondary | ICD-10-CM | POA: Diagnosis not present

## 2020-07-21 DIAGNOSIS — N202 Calculus of kidney with calculus of ureter: Secondary | ICD-10-CM | POA: Diagnosis not present

## 2020-07-21 DIAGNOSIS — E039 Hypothyroidism, unspecified: Secondary | ICD-10-CM | POA: Insufficient documentation

## 2020-07-21 DIAGNOSIS — Z8616 Personal history of COVID-19: Secondary | ICD-10-CM | POA: Diagnosis not present

## 2020-07-21 DIAGNOSIS — K8020 Calculus of gallbladder without cholecystitis without obstruction: Secondary | ICD-10-CM | POA: Diagnosis not present

## 2020-07-21 DIAGNOSIS — R42 Dizziness and giddiness: Secondary | ICD-10-CM | POA: Diagnosis not present

## 2020-07-21 DIAGNOSIS — N200 Calculus of kidney: Secondary | ICD-10-CM

## 2020-07-21 DIAGNOSIS — R1084 Generalized abdominal pain: Secondary | ICD-10-CM | POA: Diagnosis not present

## 2020-07-21 DIAGNOSIS — Z79899 Other long term (current) drug therapy: Secondary | ICD-10-CM | POA: Insufficient documentation

## 2020-07-21 DIAGNOSIS — R112 Nausea with vomiting, unspecified: Secondary | ICD-10-CM | POA: Diagnosis not present

## 2020-07-21 DIAGNOSIS — R1031 Right lower quadrant pain: Secondary | ICD-10-CM | POA: Diagnosis present

## 2020-07-21 DIAGNOSIS — R197 Diarrhea, unspecified: Secondary | ICD-10-CM | POA: Diagnosis not present

## 2020-07-21 DIAGNOSIS — N1330 Unspecified hydronephrosis: Secondary | ICD-10-CM | POA: Diagnosis not present

## 2020-07-21 LAB — URINALYSIS, ROUTINE W REFLEX MICROSCOPIC
Bacteria, UA: NONE SEEN
Bilirubin Urine: NEGATIVE
Glucose, UA: NEGATIVE mg/dL
Ketones, ur: NEGATIVE mg/dL
Leukocytes,Ua: NEGATIVE
Nitrite: NEGATIVE
Protein, ur: NEGATIVE mg/dL
RBC / HPF: >50 RBC/hpf — ABNORMAL HIGH (ref 0–5)
Specific Gravity, Urine: 1.008 (ref 1.005–1.030)
pH: 6 (ref 5.0–8.0)

## 2020-07-21 LAB — CBC WITH DIFFERENTIAL/PLATELET
Abs Immature Granulocytes: 0.02 10*3/uL (ref 0.00–0.07)
Basophils Absolute: 0 10*3/uL (ref 0.0–0.1)
Basophils Relative: 0 %
Eosinophils Absolute: 0 10*3/uL (ref 0.0–0.5)
Eosinophils Relative: 0 %
HCT: 39.7 % (ref 39.0–52.0)
Hemoglobin: 11.5 g/dL — ABNORMAL LOW (ref 13.0–17.0)
Immature Granulocytes: 0 %
Lymphocytes Relative: 9 %
Lymphs Abs: 0.8 10*3/uL (ref 0.7–4.0)
MCH: 23.5 pg — ABNORMAL LOW (ref 26.0–34.0)
MCHC: 29 g/dL — ABNORMAL LOW (ref 30.0–36.0)
MCV: 81 fL (ref 80.0–100.0)
Monocytes Absolute: 0.7 10*3/uL (ref 0.1–1.0)
Monocytes Relative: 8 %
Neutro Abs: 7.5 10*3/uL (ref 1.7–7.7)
Neutrophils Relative %: 83 %
Platelets: 164 10*3/uL (ref 150–400)
RBC: 4.9 MIL/uL (ref 4.22–5.81)
RDW: 14.6 % (ref 11.5–15.5)
WBC: 9 10*3/uL (ref 4.0–10.5)
nRBC: 0 % (ref 0.0–0.2)

## 2020-07-21 LAB — COMPREHENSIVE METABOLIC PANEL
ALT: 17 U/L (ref 0–44)
AST: 25 U/L (ref 15–41)
Albumin: 4.1 g/dL (ref 3.5–5.0)
Alkaline Phosphatase: 64 U/L (ref 38–126)
Anion gap: 7 (ref 5–15)
BUN: 12 mg/dL (ref 6–20)
CO2: 27 mmol/L (ref 22–32)
Calcium: 9.4 mg/dL (ref 8.9–10.3)
Chloride: 106 mmol/L (ref 98–111)
Creatinine, Ser: 1.32 mg/dL — ABNORMAL HIGH (ref 0.61–1.24)
GFR, Estimated: >60 mL/min (ref >60)
Glucose, Bld: 112 mg/dL — ABNORMAL HIGH (ref 70–99)
Potassium: 3.8 mmol/L (ref 3.5–5.1)
Sodium: 140 mmol/L (ref 135–145)
Total Bilirubin: 0.2 mg/dL — ABNORMAL LOW (ref 0.3–1.2)
Total Protein: 7.5 g/dL (ref 6.5–8.1)

## 2020-07-21 LAB — LIPASE, BLOOD: Lipase: 27 U/L (ref 11–51)

## 2020-07-21 MED ORDER — ONDANSETRON 4 MG PO TBDP: 4.0000 mg | ORAL_TABLET | Freq: Three times a day (TID) | ORAL | 0 refills | Status: DC | PRN

## 2020-07-21 MED ORDER — HYDROCODONE-ACETAMINOPHEN 5-325 MG PO TABS: 1.0000 | ORAL_TABLET | Freq: Four times a day (QID) | ORAL | 0 refills | Status: DC | PRN

## 2020-07-21 MED ORDER — TAMSULOSIN HCL 0.4 MG PO CAPS: 0.4000 mg | ORAL_CAPSULE | Freq: Every day | ORAL | 0 refills | Status: DC

## 2020-07-21 MED ORDER — SODIUM CHLORIDE 0.9 % IV BOLUS
1000.0000 mL | Freq: Once | INTRAVENOUS | Status: AC
  Administered 2020-07-21: 1000 mL via INTRAVENOUS

## 2020-07-21 MED ORDER — TAMSULOSIN HCL 0.4 MG PO CAPS: 0.4000 mg | ORAL_CAPSULE | Freq: Every day | ORAL | 0 refills | Status: AC

## 2020-07-21 MED ORDER — KETOROLAC TROMETHAMINE 30 MG/ML IJ SOLN
30.0000 mg | Freq: Once | INTRAMUSCULAR | Status: AC
  Administered 2020-07-21: 30 mg via INTRAVENOUS
  Filled 2020-07-21: qty 1

## 2020-07-21 MED ORDER — MORPHINE SULFATE (PF) 4 MG/ML IV SOLN
4.0000 mg | Freq: Once | INTRAVENOUS | Status: AC
  Administered 2020-07-21: 4 mg via INTRAVENOUS
  Filled 2020-07-21: qty 1

## 2020-07-21 MED ORDER — ONDANSETRON HCL 4 MG/2ML IJ SOLN
4.0000 mg | Freq: Once | INTRAMUSCULAR | Status: AC
  Administered 2020-07-21: 4 mg via INTRAVENOUS
  Filled 2020-07-21: qty 2

## 2020-07-21 NOTE — Discharge Instructions (Signed)
You can take Tylenol or Ibuprofen as directed for pain. You can alternate Tylenol and Ibuprofen every 4 hours. If you take Tylenol at 1pm, then you can take Ibuprofen at 5pm. Then you can take Tylenol again at 9pm.   Take pain medications as directed for break through pain. Do not drive or operate machinery while taking this medication.   Take Flomax as directed.  Follow up with the referred Urology group.   Return to the Emergency Department immediately if you experience any worsening abdominal pain, fever, persistent nausea and vomiting, inability keep any food down, pain with urination, blood in your urine or any other worsening or concerning symptoms.

## 2020-07-21 NOTE — ED Notes (Signed)
Patient made aware urine sample is needed. Urinal provided at bedside. 

## 2020-07-21 NOTE — ED Triage Notes (Addendum)
Per EMS, patient from PCP, RLQ pain x3 days with tenderness to palpation. Diarrhea x3 days ago and one episode of vomiting yesterday. Patient is deaf. Translator on the way.  Attempted to use WALL-E without success. Mother at bedside provided ASL interpretation.
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
## 2020-08-11 ENCOUNTER — Other Ambulatory Visit: Payer: Self-pay | Admitting: Physician Assistant

## 2020-08-11 DIAGNOSIS — F32A Depression, unspecified: Secondary | ICD-10-CM

## 2020-08-18 ENCOUNTER — Other Ambulatory Visit: Payer: Self-pay | Admitting: Physician Assistant

## 2020-08-18 DIAGNOSIS — E7841 Elevated Lipoprotein(a): Secondary | ICD-10-CM

## 2020-08-18 DIAGNOSIS — E785 Hyperlipidemia, unspecified: Secondary | ICD-10-CM
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
## 2020-09-05 ENCOUNTER — Ambulatory Visit: Payer: Medicare HMO | Admitting: Neurology

## 2020-09-11 ENCOUNTER — Ambulatory Visit (INDEPENDENT_AMBULATORY_CARE_PROVIDER_SITE_OTHER): Payer: Medicare HMO | Admitting: Physician Assistant

## 2020-09-11 ENCOUNTER — Encounter: Payer: Self-pay | Admitting: Physician Assistant

## 2020-09-11 ENCOUNTER — Other Ambulatory Visit: Payer: Self-pay

## 2020-09-11 VITALS — BP 125/73 | HR 78 | Temp 99.5°F | Ht 67.0 in | Wt 116.1 lb

## 2020-09-11 DIAGNOSIS — F32A Depression, unspecified: Secondary | ICD-10-CM

## 2020-09-11 DIAGNOSIS — G40909 Epilepsy, unspecified, not intractable, without status epilepticus: Secondary | ICD-10-CM | POA: Diagnosis not present

## 2020-09-11 DIAGNOSIS — I10 Essential (primary) hypertension: Secondary | ICD-10-CM | POA: Diagnosis not present

## 2020-09-11 DIAGNOSIS — R2681 Unsteadiness on feet: Secondary | ICD-10-CM | POA: Diagnosis not present

## 2020-09-11 NOTE — Patient Instructions (Signed)
[email or web address]">  High-Protein and High-Calorie Diet Eating high-protein and high-calorie foods can help you to gain weight, heal after an injury, and recover after an illness or surgery. The specific amount of daily protein and calories you need depends on: Your body weight. The reason this diet is recommended for you. Generally, a high-protein, high-calorie diet involves: Eating 250-500 extra calories each day. Making sure that you get enough of your daily calories from protein. Ask your health care provider how many of your calories should come from protein. Talk with a health care provider or a dietitian about how much protein and how many calories you need each day. Follow the diet as directed by your healthcare provider. What are tips for following this plan?  Reading food labels Check the nutrition facts label for calories, grams of fat and protein. Items with more than 4 grams of protein are high-protein foods. Preparing meals Add whole milk, half-and-half, or heavy cream to cereal, pudding, soup, or hot cocoa. Add whole milk to instant breakfast drinks. Add peanut butter to oatmeal or smoothies. Add powdered milk to baked goods, smoothies, or milkshakes. Add powdered milk, cream, or butter to mashed potatoes. Add cheese to cooked vegetables. Make whole-milk yogurt parfaits. Top them with granola, fruit, or nuts. Add cottage cheese to fruit. Add avocado, cheese, or both to sandwiches or salads. Add avocado to smoothies. Add meat, poultry, or seafood to rice, pasta, casseroles, salads, and soups. Use mayonnaise when making egg salad, chicken salad, or tuna salad. Use peanut butter as a dip for fruits and vegetables or as a topping for pretzels, celery, or crackers. Add beans to casseroles, dips, and spreads. Add pureed beans to sauces and soups. Replace calorie-free drinks with  calorie-containing drinks, such as milk and fruit juice. Replace water with milk or heavy cream when making foods such as oatmeal, pudding, or cocoa. Add oil or butter to cooked vegetables and grains. Add cream cheese to sandwiches or as a topping on crackers and bread. Make cream-based pastas and soups. General information Ask your health care provider if you should take a nutritional supplement. Try to eat six small meals each day instead of three large meals. A general goal is to eat every 2 to 3 hours. Eat a balanced diet. In each meal, include one food that is high in protein and one food with fat in it. Keep nutritious snacks available, such as nuts, trail mixes, dried fruit, and yogurt. If you have kidney disease or diabetes, talk with your health care provider about how much protein is safe for you. Too much protein may put extra stress on your kidneys. Drink your calories. Choose high-calorie drinks and have them after your meals. Consider setting a timer to remind you to eat. You will want to eat even if you do not feel very hungry. What high-protein foods should I eat?  Vegetables Soybeans. Peas. Grains Quinoa. Bulgur wheat. Buckwheat. Meats and other proteins Beef, pork, and poultry. Fish and seafood. Eggs. Tofu. Textured vegetable protein (TVP). Peanut butter. Nuts and seeds. Dried beans. Protein powders.Hummus. Dairy Whole milk. Whole-milk yogurt. Powdered milk. Cheese. Yahoo. Eggnog. Beverages High-protein supplement drinks. Soy milk. Other foods Protein bars. The items listed above may not be a complete list of foods and beverages you can eat and drink. Contact a dietitian for more information. What high-calorie foods should I eat? Fruits Dried fruit. Fruit leather. Canned fruit in syrup. Fruit juice. Avocado. Vegetables Vegetables cooked in oil or butter. Fried potatoes. Grains  Pasta. Quick breads. Muffins. Pancakes. Ready-to-eat cereal. Meats and other  proteins Peanut butter. Nuts and seeds. Dairy Heavy cream. Whipped cream. Cream cheese. Sour cream. Ice cream. Custard.Pudding. Whole milk dairy products. Beverages Meal-replacement beverages. Nutrition shakes. Fruit juice. Seasonings and condiments Salad dressing. Mayonnaise. Alfredo sauce. Fruit preserves or jelly. Honey.Syrup. Sweets and desserts Cake. Cookies. Pie. Pastries. Candy bars. Chocolate. Fats and oils Butter or margarine. Oil. Gravy. Other foods Meal-replacement bars. The items listed above may not be a complete list of foods and beverages you can eat and drink. Contact a dietitian for more information. Summary A high-protein, high-calorie diet can help you gain weight or heal faster after an injury, illness, or surgery. To increase your protein and calories, add ingredients such as whole milk, peanut butter, cheese, beans, meat, or seafood to meal items. To get enough extra calories each day, include high-calorie foods and beverages at each meal. Adding a high-calorie drink or shake can be an easy way to help you get enough calories each day. Talk with your healthcare provider or dietitian about the best options for you. This information is not intended to replace advice given to you by your health care provider. Make sure you discuss any questions you have with your healthcare provider. Document Revised: 01/23/2020 Document Reviewed: 01/23/2020 Elsevier Patient Education  2022 [employer].
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
## 2020-09-15 ENCOUNTER — Other Ambulatory Visit: Payer: Self-pay | Admitting: Physician Assistant

## 2020-09-15 DIAGNOSIS — K219 Gastro-esophageal reflux disease without esophagitis: Secondary | ICD-10-CM

## 2020-09-15 DIAGNOSIS — R1310 Dysphagia, unspecified: Secondary | ICD-10-CM

## 2020-10-06 ENCOUNTER — Other Ambulatory Visit: Payer: Self-pay | Admitting: Physician Assistant

## 2020-10-06 DIAGNOSIS — F32A Depression, unspecified: Secondary | ICD-10-CM

## 2020-10-20 ENCOUNTER — Other Ambulatory Visit: Payer: Self-pay | Admitting: Neurology

## 2020-10-20 ENCOUNTER — Other Ambulatory Visit: Payer: Self-pay | Admitting: Physician Assistant

## 2020-11-20 ENCOUNTER — Other Ambulatory Visit: Payer: Self-pay | Admitting: Physician Assistant

## 2020-11-20 DIAGNOSIS — E7841 Elevated Lipoprotein(a): Secondary | ICD-10-CM

## 2020-11-20 DIAGNOSIS — E785 Hyperlipidemia, unspecified: Secondary | ICD-10-CM
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
## 2020-11-22 ENCOUNTER — Ambulatory Visit: Payer: Medicare HMO | Admitting: Neurology

## 2020-11-24 ENCOUNTER — Other Ambulatory Visit: Payer: Self-pay | Admitting: Neurology

## 2020-11-25 ENCOUNTER — Ambulatory Visit (INDEPENDENT_AMBULATORY_CARE_PROVIDER_SITE_OTHER): Payer: Medicare HMO

## 2020-11-25 ENCOUNTER — Other Ambulatory Visit: Payer: Self-pay

## 2020-11-25 DIAGNOSIS — Z0000 Encounter for general adult medical examination without abnormal findings: Secondary | ICD-10-CM | POA: Diagnosis not present
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
## 2020-11-25 NOTE — Patient Instructions (Signed)
Health Maintenance, Male Adopting a healthy lifestyle and getting preventive care are important in promoting health and wellness. Ask your health care provider about: The right schedule for you to have regular tests and exams. Things you can do on your own to prevent diseases and keep yourself healthy. What should I know about diet, weight, and exercise? Eat a healthy diet  Eat a diet that includes plenty of vegetables, fruits, low-fat dairy products, and lean protein. Do not eat a lot of foods that are high in solid fats, added sugars, or sodium. Maintain a healthy weight Body mass index (BMI) is a measurement that can be used to identify possible weight problems. It estimates body fat based on height and weight. Your health care provider can help determine your BMI and help you achieve or maintain a healthy weight. Get regular exercise Get regular exercise. This is one of the most important things you can do for your health. Most adults should: Exercise for at least 150 minutes each week. The exercise should increase your heart rate and make you sweat (moderate-intensity exercise). Do strengthening exercises at least twice a week. This is in addition to the moderate-intensity exercise. Spend less time sitting. Even light physical activity can be beneficial. Watch cholesterol and blood lipids Have your blood tested for lipids and cholesterol at 56 years of age, then have this test every 5 years. You may need to have your cholesterol levels checked more often if: Your lipid or cholesterol levels are high. You are older than 56 years of age. You are at high risk for heart disease. What should I know about cancer screening? Many types of cancers can be detected early and may often be prevented. Depending on your health history and family history, you may need to have cancer screening at various ages. This may include screening for: Colorectal cancer. Prostate cancer. Skin cancer. Lung  cancer. What should I know about heart disease, diabetes, and high blood pressure? Blood pressure and heart disease High blood pressure causes heart disease and increases the risk of stroke. This is more likely to develop in people who have high blood pressure readings, are of African descent, or are overweight. Talk with your health care provider about your target blood pressure readings. Have your blood pressure checked: Every 3-5 years if you are 18-39 years of age. Every year if you are 40 years old or older. If you are between the ages of 65 and 75 and are a current or former smoker, ask your health care provider if you should have a one-time screening for abdominal aortic aneurysm (AAA). Diabetes Have regular diabetes screenings. This checks your fasting blood sugar level. Have the screening done: Once every three years after age 45 if you are at a normal weight and have a low risk for diabetes. More often and at a younger age if you are overweight or have a high risk for diabetes. What should I know about preventing infection? Hepatitis B If you have a higher risk for hepatitis B, you should be screened for this virus. Talk with your health care provider to find out if you are at risk for hepatitis B infection. Hepatitis C Blood testing is recommended for: Everyone born from 1945 through 1965. Anyone with known risk factors for hepatitis C. Sexually transmitted infections (STIs) You should be screened each year for STIs, including gonorrhea and chlamydia, if: You are sexually active and are younger than 56 years of age. You are older than 56 years   of age and your health care provider tells you that you are at risk for this type of infection. Your sexual activity has changed since you were last screened, and you are at increased risk for chlamydia or gonorrhea. Ask your health care provider if you are at risk. Ask your health care provider about whether you are at high risk for HIV.  Your health care provider may recommend a prescription medicine to help prevent HIV infection. If you choose to take medicine to prevent HIV, you should first get tested for HIV. You should then be tested every 3 months for as long as you are taking the medicine. Follow these instructions at home: Lifestyle Do not use any products that contain nicotine or tobacco, such as cigarettes, e-cigarettes, and chewing tobacco. If you need help quitting, ask your health care provider. Do not use street drugs. Do not share needles. Ask your health care provider for help if you need support or information about quitting drugs. Alcohol use Do not drink alcohol if your health care provider tells you not to drink. If you drink alcohol: Limit how much you have to 0-2 drinks a day. Be aware of how much alcohol is in your drink. In the [place] one drink equals one 12 oz bottle of beer (355 mL), one 5 oz glass of wine (148 mL), or one 1 oz glass of hard liquor (44 mL). General instructions Schedule regular health, dental, and eye exams. Stay current with your vaccines. Tell your health care provider if: You often feel depressed. You have ever been abused or do not feel safe at home. Summary Adopting a healthy lifestyle and getting preventive care are important in promoting health and wellness. Follow your health care provider's instructions about healthy diet, exercising, and getting tested or screened for diseases. Follow your health care provider's instructions on monitoring your cholesterol and blood pressure. This information is not intended to replace advice given to you by your health care provider. Make sure you discuss any questions you have with your health care provider. Document Revised: 04/28/2020 Document Reviewed: 02/11/2018 Elsevier Patient Education  2022 Elsevier Inc.  

## 2020-11-27 ENCOUNTER — Other Ambulatory Visit: Payer: Self-pay

## 2020-11-27 MED ORDER — LEVETIRACETAM 1000 MG PO TABS
ORAL_TABLET | ORAL | 0 refills | Status: DC
Start: 2020-11-27 — End: 2021-01-30

## 2020-12-08 ENCOUNTER — Other Ambulatory Visit: Payer: Self-pay | Admitting: Physician Assistant

## 2020-12-08 DIAGNOSIS — F32A Depression, unspecified: Secondary | ICD-10-CM

## 2020-12-12 ENCOUNTER — Ambulatory Visit: Payer: Medicare HMO | Admitting: Physician Assistant

## 2020-12-22 ENCOUNTER — Other Ambulatory Visit: Payer: Self-pay | Admitting: Nurse Practitioner

## 2020-12-22 ENCOUNTER — Other Ambulatory Visit: Payer: Self-pay | Admitting: Physician Assistant
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
## 2021-01-22 ENCOUNTER — Ambulatory Visit: Payer: Medicare HMO | Admitting: Neurology

## 2021-01-22 ENCOUNTER — Encounter: Payer: Self-pay | Admitting: Neurology

## 2021-01-22 ENCOUNTER — Other Ambulatory Visit: Payer: Self-pay

## 2021-01-22 VITALS — BP 132/91 | HR 74 | Ht 70.0 in | Wt 131.2 lb

## 2021-01-22 DIAGNOSIS — S069X9D Unspecified intracranial injury with loss of consciousness of unspecified duration, subsequent encounter: Secondary | ICD-10-CM | POA: Diagnosis not present

## 2021-01-22 DIAGNOSIS — G40109 Localization-related (focal) (partial) symptomatic epilepsy and epileptic syndromes with simple partial seizures, not intractable, without status epilepticus: Secondary | ICD-10-CM | POA: Diagnosis not present

## 2021-01-22 NOTE — Patient Instructions (Signed)
Continue levetiracetam 1000mg  twice daily

## 2021-01-26 ENCOUNTER — Other Ambulatory Visit: Payer: Self-pay | Admitting: Neurology

## 2021-01-26 ENCOUNTER — Other Ambulatory Visit: Payer: Self-pay | Admitting: Physician Assistant

## 2021-01-30 ENCOUNTER — Other Ambulatory Visit: Payer: Self-pay | Admitting: Neurology

## 2021-02-05 ENCOUNTER — Other Ambulatory Visit: Payer: Self-pay | Admitting: Physician Assistant

## 2021-02-05 ENCOUNTER — Other Ambulatory Visit: Payer: Self-pay | Admitting: Neurology

## 2021-02-05 DIAGNOSIS — F32A Depression, unspecified: Secondary | ICD-10-CM

## 2021-02-12 ENCOUNTER — Other Ambulatory Visit: Payer: Self-pay

## 2021-02-26 ENCOUNTER — Other Ambulatory Visit: Payer: Self-pay | Admitting: Physician Assistant

## 2021-02-26 DIAGNOSIS — E7841 Elevated Lipoprotein(a): Secondary | ICD-10-CM

## 2021-02-26 DIAGNOSIS — E785 Hyperlipidemia, unspecified: Secondary | ICD-10-CM

## 2021-02-27 ENCOUNTER — Other Ambulatory Visit: Payer: Self-pay | Admitting: Physician Assistant

## 2021-02-27 DIAGNOSIS — E785 Hyperlipidemia, unspecified: Secondary | ICD-10-CM

## 2021-02-27 DIAGNOSIS — E7841 Elevated Lipoprotein(a): Secondary | ICD-10-CM

## 2021-03-02 ENCOUNTER — Other Ambulatory Visit: Payer: Self-pay | Admitting: Physician Assistant

## 2021-03-02 ENCOUNTER — Other Ambulatory Visit: Payer: Self-pay | Admitting: Neurology

## 2021-03-02 DIAGNOSIS — F32A Depression, unspecified: Secondary | ICD-10-CM

## 2021-03-23 ENCOUNTER — Other Ambulatory Visit: Payer: Self-pay | Admitting: Physician Assistant

## 2021-03-23 DIAGNOSIS — F32A Depression, unspecified: Secondary | ICD-10-CM

## 2021-04-19 ENCOUNTER — Other Ambulatory Visit: Payer: Self-pay | Admitting: Physician Assistant

## 2021-04-26 ENCOUNTER — Other Ambulatory Visit: Payer: Self-pay | Admitting: Physician Assistant

## 2021-05-03 ENCOUNTER — Other Ambulatory Visit: Payer: Self-pay | Admitting: Physician Assistant

## 2021-05-03 DIAGNOSIS — F32A Depression, unspecified: Secondary | ICD-10-CM

## 2021-05-10 ENCOUNTER — Other Ambulatory Visit: Payer: Self-pay | Admitting: Physician Assistant

## 2021-05-10 DIAGNOSIS — E7841 Elevated Lipoprotein(a): Secondary | ICD-10-CM

## 2021-05-10 DIAGNOSIS — E785 Hyperlipidemia, unspecified: Secondary | ICD-10-CM

## 2021-05-21 ENCOUNTER — Other Ambulatory Visit: Payer: Self-pay

## 2021-05-21 ENCOUNTER — Telehealth: Payer: Self-pay | Admitting: Physician Assistant

## 2021-05-21 DIAGNOSIS — F32A Depression, unspecified: Secondary | ICD-10-CM

## 2021-05-21 MED ORDER — MIRTAZAPINE 7.5 MG PO TABS: ORAL_TABLET | ORAL | 0 refills | Status: DC

## 2021-05-21 NOTE — Telephone Encounter (Signed)
Patient requesting refill of seizure medication. Please advise. [phone] ?

## 2021-05-21 NOTE — Telephone Encounter (Signed)
Refill sent.

## 2021-05-21 NOTE — Telephone Encounter (Signed)
Patient is aware and scheduled an appt ?
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
## 2021-05-25 ENCOUNTER — Other Ambulatory Visit: Payer: Self-pay

## 2021-05-25 ENCOUNTER — Encounter: Payer: Self-pay | Admitting: Physician Assistant

## 2021-05-25 ENCOUNTER — Ambulatory Visit (INDEPENDENT_AMBULATORY_CARE_PROVIDER_SITE_OTHER): Payer: Medicare PPO | Admitting: Physician Assistant

## 2021-05-25 VITALS — BP 138/80 | HR 62 | Ht 67.0 in | Wt 129.0 lb

## 2021-05-25 DIAGNOSIS — E785 Hyperlipidemia, unspecified: Secondary | ICD-10-CM

## 2021-05-25 DIAGNOSIS — E559 Vitamin D deficiency, unspecified: Secondary | ICD-10-CM | POA: Diagnosis not present

## 2021-05-25 DIAGNOSIS — I10 Essential (primary) hypertension: Secondary | ICD-10-CM | POA: Insufficient documentation

## 2021-05-25 DIAGNOSIS — G40909 Epilepsy, unspecified, not intractable, without status epilepticus: Secondary | ICD-10-CM | POA: Diagnosis not present

## 2021-05-25 DIAGNOSIS — F32A Depression, unspecified: Secondary | ICD-10-CM | POA: Diagnosis not present

## 2021-05-25 DIAGNOSIS — E039 Hypothyroidism, unspecified: Secondary | ICD-10-CM | POA: Diagnosis not present

## 2021-05-25 MED ORDER — LEVOTHYROXINE SODIUM 25 MCG PO TABS: ORAL_TABLET | ORAL | 1 refills | Status: DC

## 2021-05-25 MED ORDER — LEVOTHYROXINE SODIUM 75 MCG PO TABS: ORAL_TABLET | ORAL | 1 refills | Status: DC

## 2021-05-25 NOTE — Assessment & Plan Note (Signed)
-  Followed by Neurology. ?-On Keppra  100 mg BID. ?

## 2021-05-25 NOTE — Assessment & Plan Note (Signed)
-  Patient was previously on Losartan-HCTZ and with recurrent AKI blood pressure medication was discontinued. Patient has been managing with diet. BP today stable, will continue to monitor. Will collect CMP to monitor renal function.  ?

## 2021-05-25 NOTE — Assessment & Plan Note (Signed)
>>  ASSESSMENT AND PLAN FOR SEIZURE DISORDER (HCC) WRITTEN ON 05/25/2021 11:29 AM BY ABONZA, MARITZA, PA-C  -Followed by Neurology. -On Keppra  100 mg BID.

## 2021-05-25 NOTE — Assessment & Plan Note (Signed)
-  Since starting medication therapy with mirtazapine 7.5 mg mood has improved and is stable. Will continue current medication. Will continue to monitor. ?

## 2021-05-25 NOTE — Assessment & Plan Note (Addendum)
-  Last TSH wnl at 0.576. ?-Continue current medication regimen. Provided refills. ?-Rechecking thyroid labs today. Pending results will make medication adjustments if indicated. ? ?

## 2021-05-25 NOTE — Assessment & Plan Note (Signed)
-  Last Vitamin D elevated, repeating Vit D today. Pending labs will recommend to resume Vit D supplement if indicated. ?

## 2021-05-26 LAB — CBC WITH DIFFERENTIAL/PLATELET
Basophils Absolute: 0 10*3/uL (ref 0.0–0.2)
Basos: 1 %
EOS (ABSOLUTE): 0.1 10*3/uL (ref 0.0–0.4)
Eos: 1 %
Hematocrit: 45.1 % (ref 37.5–51.0)
Hemoglobin: 13.6 g/dL (ref 13.0–17.7)
Immature Grans (Abs): 0 10*3/uL (ref 0.0–0.1)
Immature Granulocytes: 0 %
Lymphocytes Absolute: 1.1 10*3/uL (ref 0.7–3.1)
Lymphs: 21 %
MCH: 25 pg — ABNORMAL LOW (ref 26.6–33.0)
MCHC: 30.2 g/dL — ABNORMAL LOW (ref 31.5–35.7)
MCV: 83 fL (ref 79–97)
Monocytes Absolute: 0.4 10*3/uL (ref 0.1–0.9)
Monocytes: 7 %
Neutrophils Absolute: 3.6 10*3/uL (ref 1.4–7.0)
Neutrophils: 70 %
Platelets: 173 10*3/uL (ref 150–450)
RBC: 5.43 x10E6/uL (ref 4.14–5.80)
RDW: 14.6 % (ref 11.6–15.4)
WBC: 5.2 10*3/uL (ref 3.4–10.8)

## 2021-05-26 LAB — COMPREHENSIVE METABOLIC PANEL
ALT: 11 IU/L (ref 0–44)
AST: 18 IU/L (ref 0–40)
Albumin/Globulin Ratio: 1.7 (ref 1.2–2.2)
Albumin: 4.4 g/dL (ref 3.8–4.9)
Alkaline Phosphatase: 89 IU/L (ref 44–121)
BUN/Creatinine Ratio: 9 (ref 9–20)
BUN: 11 mg/dL (ref 6–24)
Bilirubin Total: 0.4 mg/dL (ref 0.0–1.2)
CO2: 25 mmol/L (ref 20–29)
Calcium: 9.7 mg/dL (ref 8.7–10.2)
Chloride: 102 mmol/L (ref 96–106)
Creatinine, Ser: 1.16 mg/dL (ref 0.76–1.27)
Globulin, Total: 2.6 g/dL (ref 1.5–4.5)
Glucose: 91 mg/dL (ref 70–99)
Potassium: 4.1 mmol/L (ref 3.5–5.2)
Sodium: 140 mmol/L (ref 134–144)
Total Protein: 7 g/dL (ref 6.0–8.5)
eGFR: 74 mL/min/{1.73_m2} (ref >59)

## 2021-05-26 LAB — LIPID PANEL
Chol/HDL Ratio: 4 ratio (ref 0.0–5.0)
Cholesterol, Total: 143 mg/dL (ref 100–199)
HDL: 36 mg/dL — ABNORMAL LOW (ref >39)
LDL Chol Calc (NIH): 87 mg/dL (ref 0–99)
Triglycerides: 106 mg/dL (ref 0–149)
VLDL Cholesterol Cal: 20 mg/dL (ref 5–40)

## 2021-05-26 LAB — T4, FREE: Free T4: 1.91 ng/dL — ABNORMAL HIGH (ref 0.82–1.77)

## 2021-05-26 LAB — VITAMIN D 25 HYDROXY (VIT D DEFICIENCY, FRACTURES): Vit D, 25-Hydroxy: 44.8 ng/mL (ref 30.0–100.0)

## 2021-05-26 LAB — TSH: TSH: 0.383 u[IU]/mL — ABNORMAL LOW (ref 0.450–4.500)

## 2021-05-28 ENCOUNTER — Telehealth: Payer: Self-pay | Admitting: Neurology

## 2021-05-28 MED ORDER — LEVETIRACETAM 1000 MG PO TABS: ORAL_TABLET | ORAL | 6 refills | Status: DC

## 2021-05-28 NOTE — Telephone Encounter (Signed)
Patients mom called and stated they need a new prescription for Keppra called in to the [facility] on [address]. ?

## 2021-05-28 NOTE — Telephone Encounter (Signed)
6 refills sent. ?

## 2021-05-31 ENCOUNTER — Telehealth: Payer: Self-pay

## 2021-05-31 ENCOUNTER — Other Ambulatory Visit (HOSPITAL_COMMUNITY): Payer: Self-pay

## 2021-05-31 ENCOUNTER — Telehealth: Payer: Self-pay | Admitting: Neurology

## 2021-05-31 NOTE — Telephone Encounter (Signed)
We need the patient's current pharmacy ins. processing info., in order to submit a PA for Keppra.  Thanks ?

## 2021-05-31 NOTE — Telephone Encounter (Signed)
Pt's mother came into the office and gave [place] the updated [place] card for the prior authorization for the [place]. It has been scanned into the system. ?

## 2021-05-31 NOTE — Telephone Encounter (Signed)
Patient Advocate Encounter ?  ?Received notification from [facility] that prior authorization for Keppra 1,'000mg'$  tabs is required by his/her insurance [employer]. ?  ?PA submitted on 05/31/21 ? ?Submitted PA OTP ? ?Status is pending ?   ?[facility] will continue to follow: ? ?Patient Advocate ?Fax: [phone]  ?

## 2021-05-31 NOTE — Telephone Encounter (Signed)
Spoke to Patient mother, Patient has a new [employer] card. She will up load it to mychart. ? ?Pt has enough pills to last maybe five days.  ?Advised mother if the [facility] they normal go to want transferred script to one she can go to over the weekend please let me know and I will send the script for her to another pharmacy of her choice.  ?

## 2021-06-04 NOTE — Telephone Encounter (Signed)
Patient Advocate Encounter ? ?Prior Authorization for Levetiracetam 1000 mg tablets has been approved.   ? ? ?No Prior Authorization Needed ? ? ? ?Lyndel Safe, CPhT ?Pharmacy Patient Advocate Specialist ?[facility] Patient Advocate Team ?Direct Number: [phone]  Fax: [phone]  ?

## 2021-06-25 ENCOUNTER — Other Ambulatory Visit: Payer: Self-pay | Admitting: Physician Assistant

## 2021-06-25 DIAGNOSIS — F32A Depression, unspecified: Secondary | ICD-10-CM

## 2021-07-03 IMAGING — CT CT ORBITS W/ CM
3 series · 14 of 47 positions shown, 16 images · IV contrast (omnipaque)
Comparison: CT head 10/30/2019

CLINICAL DATA: Right eye not moving properly.  Eye pain.  Seizure.

EXAM:
CT ORBITS WITH CONTRAST
TECHNIQUE: Multidetector CT images was performed according to the standard
protocol following intravenous contrast administration.
CONTRAST:  75mL OMNIPAQUE IOHEXOL 300 MG/ML  SOLN

[Series 3: orbits 2.0 h30s st · axial · 0.33mm/px · z∈[-186,-84]mm · 8 of 61 slices shown, 10 images]
[im 5/61  brain]
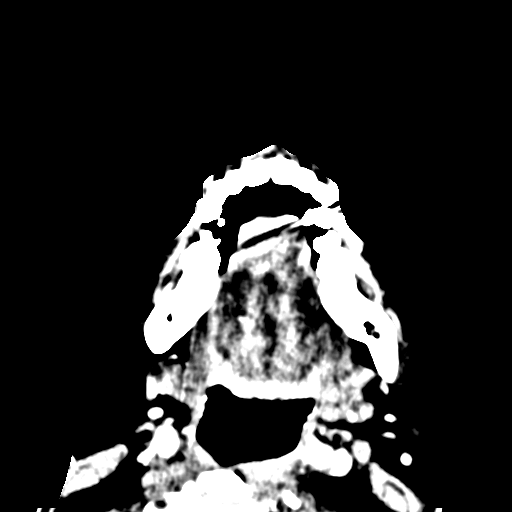
[im 5/61  bone]
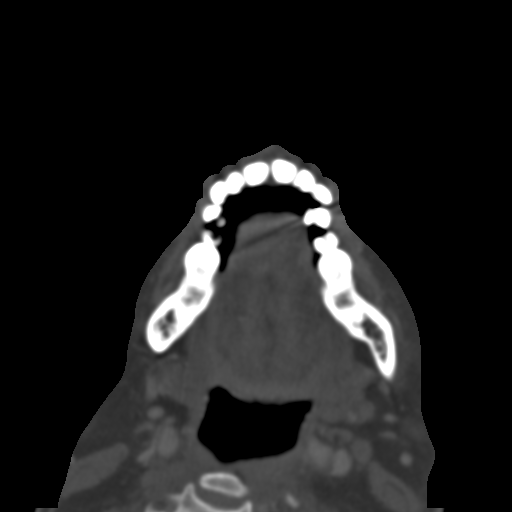
[im 13/61  bone]
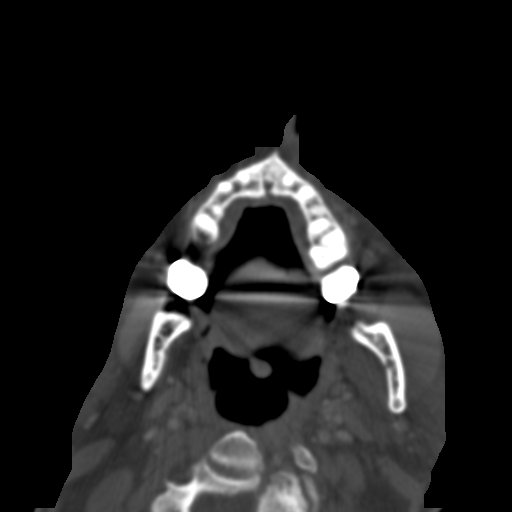
[im 19/61  bone]
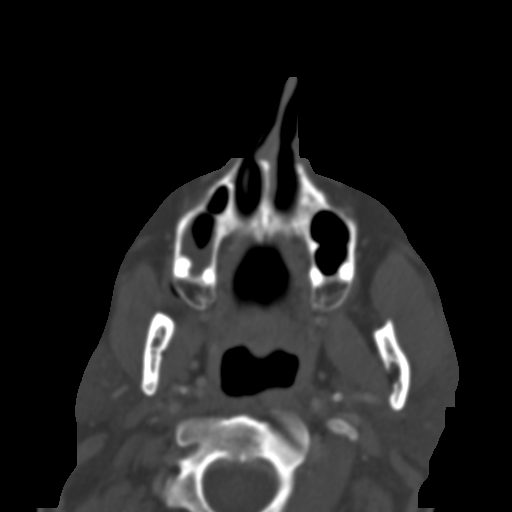
[im 27/61  bone]
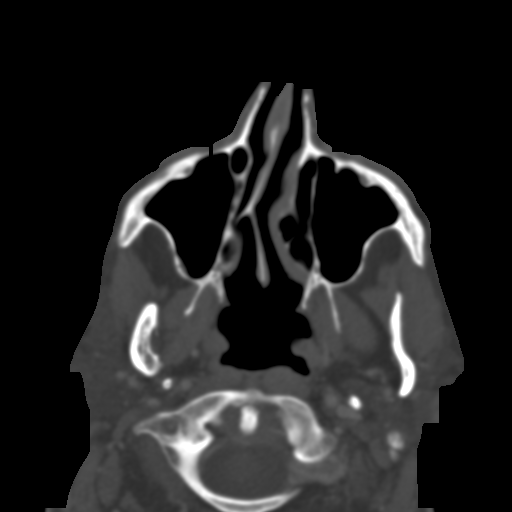
[im 34/61  brain]
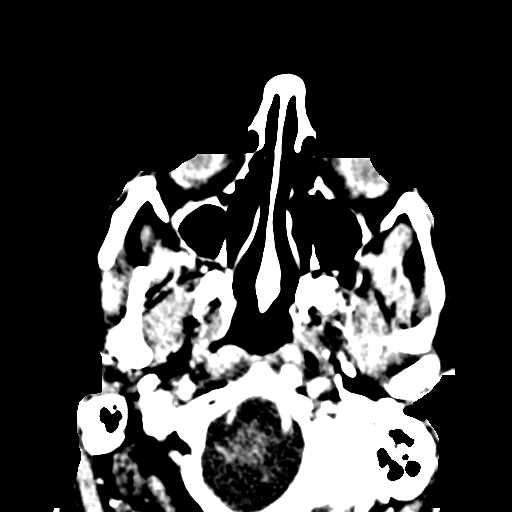
[im 34/61  bone]
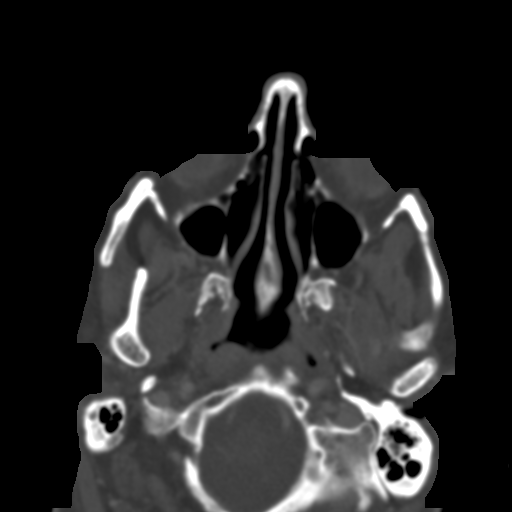
[im 42/61  bone]
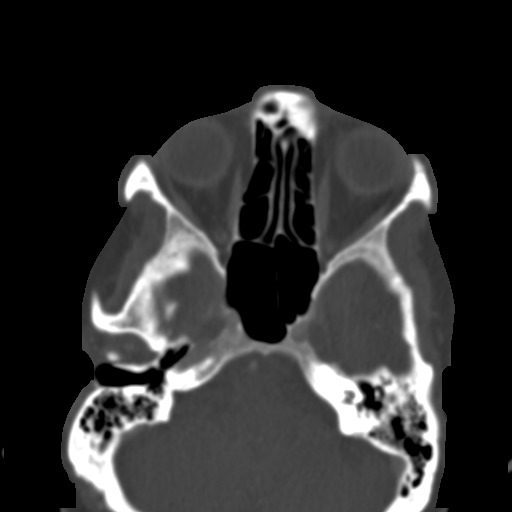
[im 48/61  bone]
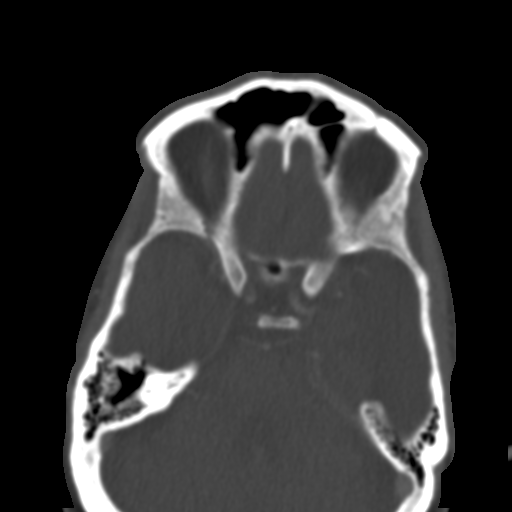
[im 56/61  bone]
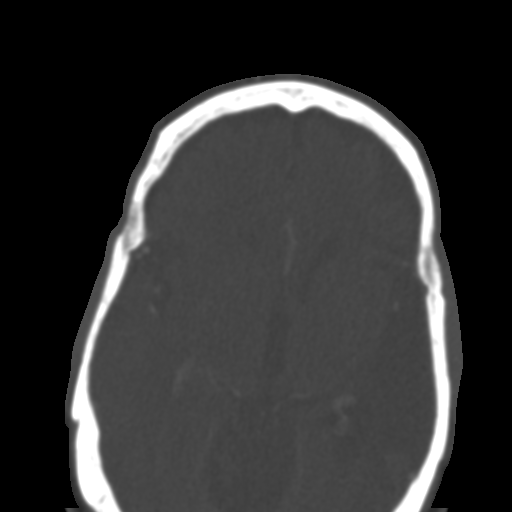

[Series 8: orbits 2.0 coronal · coronal · 0.24mm/px · 3 of 100 slices shown]
[im 34/100  bone]
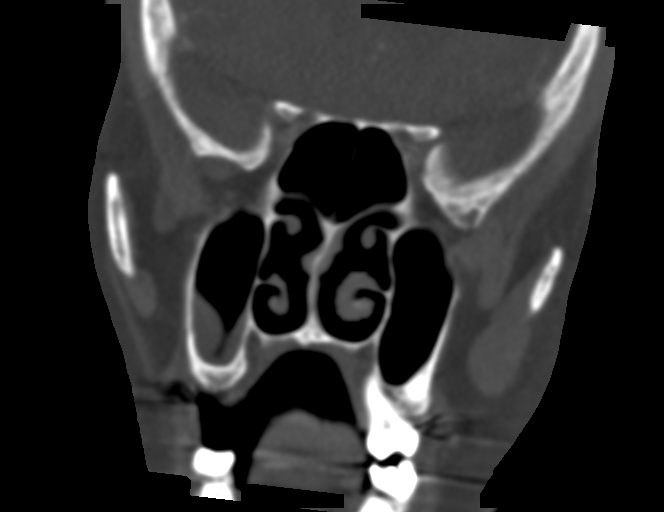
[im 45/100  bone]
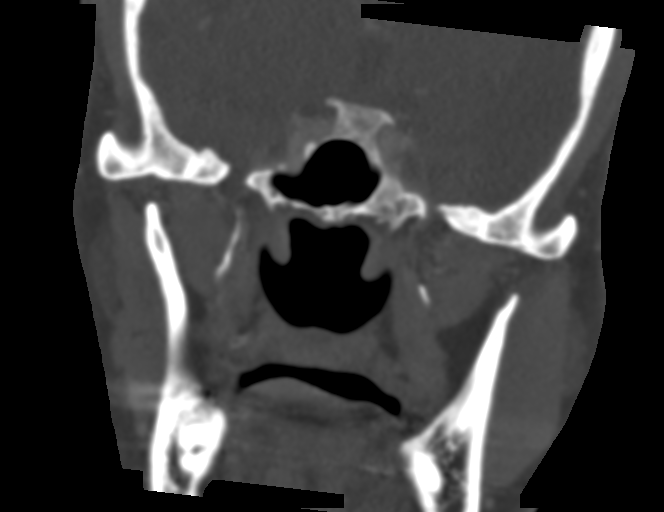
[im 56/100  bone]
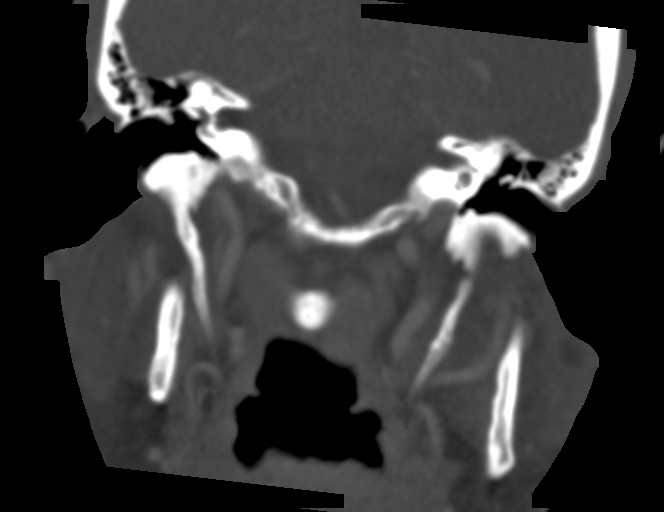

[Series 9: orbits 2.0 sagittal · sagittal · 0.24mm/px · 3 of 79 slices shown]
[im 27/79  bone]
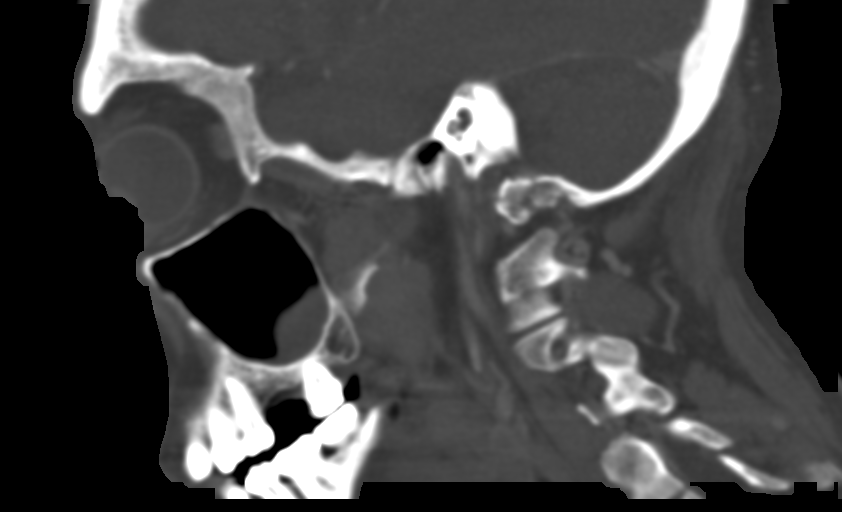
[im 40/79  bone]
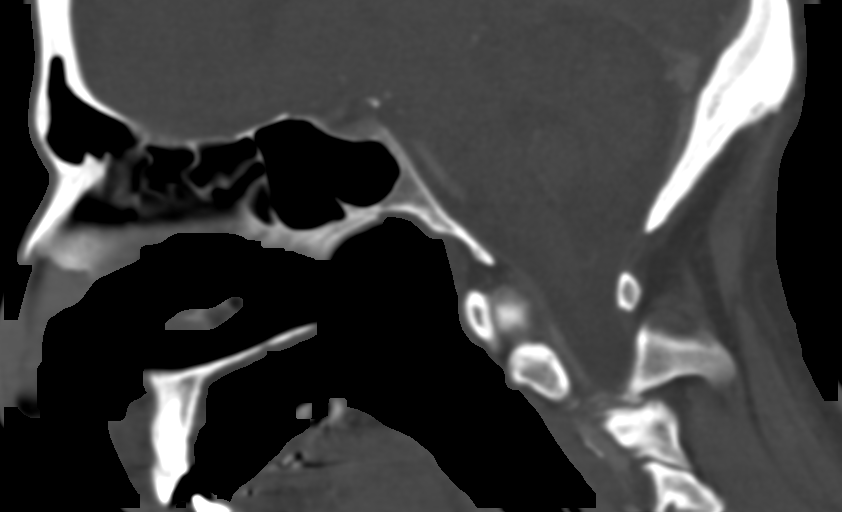
[im 53/79  bone]
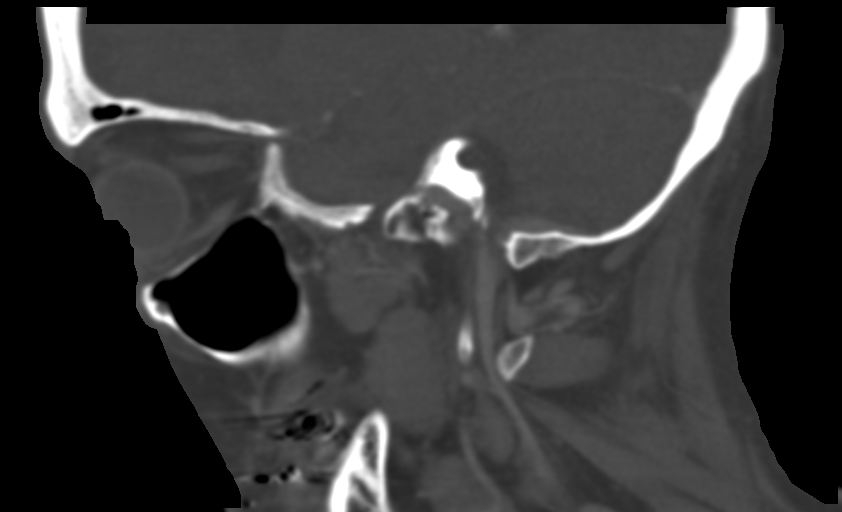

[14 of 47 positions shown; findings below may reference images not displayed]

FINDINGS: Orbits: Normal globe. Normal lens location. No orbital mass or
edema. Extraocular muscles normal. Optic nerve normal. Optic chiasm
normal. Pituitary not enlarged. Normal cavernous sinus bilaterally.
Normal bony orbit.

Visualized sinuses: Mild mucosal edema base of right maxillary
sinus. Remaining paranasal sinuses clear. Mastoid clear bilaterally.

Soft tissues: No soft tissue mass or adenopathy.  Normal pharynx.

Limited intracranial: Chronic infarct left temporal lobe as noted on
prior CT.
IMPRESSION: Normal orbit.

Mild mucosal edema right maxillary sinus.

Chronic infarct left temporal lobe.

## 2021-07-04 ENCOUNTER — Other Ambulatory Visit: Payer: Medicare HMO

## 2021-07-04 DIAGNOSIS — E039 Hypothyroidism, unspecified: Secondary | ICD-10-CM | POA: Diagnosis not present

## 2021-07-05 LAB — T3, FREE: T3, Free: 3.2 pg/mL (ref 2.0–4.4)

## 2021-07-05 LAB — TSH: TSH: 1.95 u[IU]/mL (ref 0.450–4.500)

## 2021-07-20 ENCOUNTER — Other Ambulatory Visit: Payer: Self-pay | Admitting: Physician Assistant

## 2021-07-20 DIAGNOSIS — F32A Depression, unspecified: Secondary | ICD-10-CM

## 2021-08-23 ENCOUNTER — Other Ambulatory Visit: Payer: Self-pay | Admitting: Physician Assistant

## 2021-08-23 DIAGNOSIS — F32A Depression, unspecified: Secondary | ICD-10-CM

## 2021-08-23 DIAGNOSIS — E785 Hyperlipidemia, unspecified: Secondary | ICD-10-CM

## 2021-08-23 DIAGNOSIS — E7841 Elevated Lipoprotein(a): Secondary | ICD-10-CM

## 2021-09-28 ENCOUNTER — Other Ambulatory Visit: Payer: Self-pay | Admitting: Physician Assistant

## 2021-09-28 DIAGNOSIS — F32A Depression, unspecified: Secondary | ICD-10-CM

## 2021-10-26 ENCOUNTER — Other Ambulatory Visit: Payer: Self-pay | Admitting: Physician Assistant

## 2021-10-26 DIAGNOSIS — F32A Depression, unspecified: Secondary | ICD-10-CM

## 2021-11-29 ENCOUNTER — Ambulatory Visit (INDEPENDENT_AMBULATORY_CARE_PROVIDER_SITE_OTHER): Payer: Medicare HMO | Admitting: Physician Assistant

## 2021-11-29 ENCOUNTER — Encounter: Payer: Self-pay | Admitting: Physician Assistant

## 2021-11-29 VITALS — BP 129/85 | HR 95 | Temp 97.9°F | Ht 67.0 in | Wt 127.8 lb

## 2021-11-29 DIAGNOSIS — E559 Vitamin D deficiency, unspecified: Secondary | ICD-10-CM | POA: Diagnosis not present

## 2021-11-29 DIAGNOSIS — E785 Hyperlipidemia, unspecified: Secondary | ICD-10-CM

## 2021-11-29 DIAGNOSIS — E039 Hypothyroidism, unspecified: Secondary | ICD-10-CM

## 2021-11-29 DIAGNOSIS — I10 Essential (primary) hypertension: Secondary | ICD-10-CM | POA: Diagnosis not present

## 2021-11-29 DIAGNOSIS — Z0000 Encounter for general adult medical examination without abnormal findings: Secondary | ICD-10-CM | POA: Diagnosis not present
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
## 2021-11-29 NOTE — Patient Instructions (Signed)

## 2021-11-30 ENCOUNTER — Other Ambulatory Visit: Payer: Self-pay | Admitting: Physician Assistant

## 2021-11-30 DIAGNOSIS — E039 Hypothyroidism, unspecified: Secondary | ICD-10-CM

## 2021-11-30 DIAGNOSIS — F32A Depression, unspecified: Secondary | ICD-10-CM

## 2021-11-30 LAB — COMPREHENSIVE METABOLIC PANEL
ALT: 15 IU/L (ref 0–44)
AST: 18 IU/L (ref 0–40)
Albumin/Globulin Ratio: 1.5 (ref 1.2–2.2)
Albumin: 4.1 g/dL (ref 3.8–4.9)
Alkaline Phosphatase: 98 IU/L (ref 44–121)
BUN/Creatinine Ratio: 7 — ABNORMAL LOW (ref 9–20)
BUN: 9 mg/dL (ref 6–24)
Bilirubin Total: 0.5 mg/dL (ref 0.0–1.2)
CO2: 25 mmol/L (ref 20–29)
Calcium: 9 mg/dL (ref 8.7–10.2)
Chloride: 101 mmol/L (ref 96–106)
Creatinine, Ser: 1.32 mg/dL — ABNORMAL HIGH (ref 0.76–1.27)
Globulin, Total: 2.8 g/dL (ref 1.5–4.5)
Glucose: 84 mg/dL (ref 70–99)
Potassium: 4.3 mmol/L (ref 3.5–5.2)
Sodium: 140 mmol/L (ref 134–144)
Total Protein: 6.9 g/dL (ref 6.0–8.5)
eGFR: 63 mL/min/{1.73_m2} (ref >59)

## 2021-11-30 LAB — CBC WITH DIFFERENTIAL/PLATELET
Basophils Absolute: 0.1 10*3/uL (ref 0.0–0.2)
Basos: 1 %
EOS (ABSOLUTE): 0 10*3/uL (ref 0.0–0.4)
Eos: 1 %
Hematocrit: 42.9 % (ref 37.5–51.0)
Hemoglobin: 13.7 g/dL (ref 13.0–17.7)
Immature Grans (Abs): 0 10*3/uL (ref 0.0–0.1)
Immature Granulocytes: 0 %
Lymphocytes Absolute: 0.9 10*3/uL (ref 0.7–3.1)
Lymphs: 22 %
MCH: 26.1 pg — ABNORMAL LOW (ref 26.6–33.0)
MCHC: 31.9 g/dL (ref 31.5–35.7)
MCV: 82 fL (ref 79–97)
Monocytes Absolute: 0.3 10*3/uL (ref 0.1–0.9)
Monocytes: 7 %
Neutrophils Absolute: 2.8 10*3/uL (ref 1.4–7.0)
Neutrophils: 69 %
Platelets: 152 10*3/uL (ref 150–450)
RBC: 5.24 x10E6/uL (ref 4.14–5.80)
RDW: 13.4 % (ref 11.6–15.4)
WBC: 4.1 10*3/uL (ref 3.4–10.8)

## 2021-11-30 LAB — LIPID PANEL
Chol/HDL Ratio: 2.8 ratio (ref 0.0–5.0)
Cholesterol, Total: 101 mg/dL (ref 100–199)
HDL: 36 mg/dL — ABNORMAL LOW
LDL Chol Calc (NIH): 47 mg/dL (ref 0–99)
Triglycerides: 91 mg/dL (ref 0–149)
VLDL Cholesterol Cal: 18 mg/dL (ref 5–40)

## 2021-11-30 LAB — TSH: TSH: 0.062 u[IU]/mL — ABNORMAL LOW (ref 0.450–4.500)

## 2021-11-30 LAB — VITAMIN D 25 HYDROXY (VIT D DEFICIENCY, FRACTURES): Vit D, 25-Hydroxy: 52.1 ng/mL (ref 30.0–100.0)

## 2021-12-03 ENCOUNTER — Other Ambulatory Visit: Payer: Self-pay | Admitting: Physician Assistant

## 2021-12-03 DIAGNOSIS — E785 Hyperlipidemia, unspecified: Secondary | ICD-10-CM

## 2021-12-03 DIAGNOSIS — E7841 Elevated Lipoprotein(a): Secondary | ICD-10-CM

## 2021-12-14 ENCOUNTER — Other Ambulatory Visit: Payer: Self-pay | Admitting: Physician Assistant

## 2021-12-14 DIAGNOSIS — E785 Hyperlipidemia, unspecified: Secondary | ICD-10-CM

## 2021-12-14 DIAGNOSIS — E7841 Elevated Lipoprotein(a): Secondary | ICD-10-CM

## 2021-12-24 ENCOUNTER — Other Ambulatory Visit: Payer: Self-pay | Admitting: Physician Assistant

## 2021-12-24 DIAGNOSIS — E7841 Elevated Lipoprotein(a): Secondary | ICD-10-CM

## 2021-12-24 DIAGNOSIS — E785 Hyperlipidemia, unspecified: Secondary | ICD-10-CM

## 2022-01-11 ENCOUNTER — Other Ambulatory Visit: Payer: Medicare HMO

## 2022-01-11 DIAGNOSIS — E039 Hypothyroidism, unspecified: Secondary | ICD-10-CM

## 2022-01-12 LAB — T3: T3, Total: 130 ng/dL (ref 71–180)

## 2022-01-12 LAB — T4, FREE: Free T4: 1.99 ng/dL — ABNORMAL HIGH (ref 0.82–1.77)

## 2022-01-12 LAB — TSH: TSH: 0.03 u[IU]/mL — ABNORMAL LOW (ref 0.450–4.500)

## 2022-01-14 ENCOUNTER — Other Ambulatory Visit: Payer: Self-pay | Admitting: Physician Assistant

## 2022-01-14 DIAGNOSIS — E039 Hypothyroidism, unspecified: Secondary | ICD-10-CM

## 2022-01-14 MED ORDER — LEVOTHYROXINE SODIUM 50 MCG PO TABS: 50.0000 ug | ORAL_TABLET | Freq: Every day | ORAL | 1 refills | Status: DC

## 2022-01-15 ENCOUNTER — Other Ambulatory Visit: Payer: Self-pay

## 2022-01-15 ENCOUNTER — Telehealth: Payer: Self-pay

## 2022-01-15 DIAGNOSIS — E039 Hypothyroidism, unspecified: Secondary | ICD-10-CM

## 2022-01-15 NOTE — Telephone Encounter (Signed)
90  day was sent to pharmacy on 01/14/22

## 2022-01-15 NOTE — Telephone Encounter (Signed)
Future lab orders have been placed.

## 2022-01-15 NOTE — Telephone Encounter (Signed)
Pt mother [name] returned call for results. I advised her that Herb Grays result note states TSH remains low and free T4 is elevated so recommend reducing levothyroxine from 75 mcg to 50 mcg daily and repeating thyroid labs (TSH, free T4, T3) in 6 weeks.  We scheduled lab appt for 02/22/22.  She has no further questions.  FYI
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
## 2022-01-22 ENCOUNTER — Encounter: Payer: Self-pay | Admitting: Neurology

## 2022-01-22 ENCOUNTER — Ambulatory Visit: Payer: Medicare HMO | Admitting: Neurology
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
## 2022-01-29 ENCOUNTER — Encounter: Payer: Self-pay | Admitting: Neurology

## 2022-01-29 ENCOUNTER — Ambulatory Visit: Payer: Medicare HMO | Admitting: Neurology

## 2022-01-29 VITALS — BP 136/69 | HR 69 | Ht 69.0 in | Wt 129.0 lb

## 2022-01-29 DIAGNOSIS — S069X9D Unspecified intracranial injury with loss of consciousness of unspecified duration, subsequent encounter: Secondary | ICD-10-CM

## 2022-01-29 DIAGNOSIS — G40109 Localization-related (focal) (partial) symptomatic epilepsy and epileptic syndromes with simple partial seizures, not intractable, without status epilepticus: Secondary | ICD-10-CM | POA: Diagnosis not present

## 2022-01-29 MED ORDER — LEVETIRACETAM 1000 MG PO TABS: ORAL_TABLET | ORAL | 3 refills | Status: DC

## 2022-01-29 NOTE — Patient Instructions (Signed)
Refilled levetiracetam (Keppra) '1000mg'$  twice daily Follow up one year

## 2022-02-22 ENCOUNTER — Other Ambulatory Visit: Payer: Medicare HMO

## 2022-02-22 DIAGNOSIS — E039 Hypothyroidism, unspecified: Secondary | ICD-10-CM | POA: Diagnosis not present

## 2022-02-23 LAB — TSH: TSH: 6.11 u[IU]/mL — ABNORMAL HIGH (ref 0.450–4.500)

## 2022-02-23 LAB — T4, FREE: Free T4: 1.26 ng/dL (ref 0.82–1.77)

## 2022-02-23 LAB — T3: T3, Total: 107 ng/dL (ref 71–180)

## 2022-02-28 ENCOUNTER — Other Ambulatory Visit: Payer: Self-pay

## 2022-02-28 DIAGNOSIS — F32A Depression, unspecified: Secondary | ICD-10-CM

## 2022-02-28 DIAGNOSIS — E039 Hypothyroidism, unspecified: Secondary | ICD-10-CM

## 2022-02-28 MED ORDER — MIRTAZAPINE 7.5 MG PO TABS: ORAL_TABLET | ORAL | 0 refills | Status: DC

## 2022-02-28 MED ORDER — LEVOTHYROXINE SODIUM 50 MCG PO TABS: 50.0000 ug | ORAL_TABLET | Freq: Every day | ORAL | 0 refills | Status: DC

## 2022-02-28 NOTE — Addendum Note (Signed)
Addended by: Gemma Payor on: 02/28/2022 09:41 AM   Modules accepted: Orders

## 2022-03-14 ENCOUNTER — Other Ambulatory Visit: Payer: Self-pay

## 2022-03-14 DIAGNOSIS — E785 Hyperlipidemia, unspecified: Secondary | ICD-10-CM

## 2022-03-14 DIAGNOSIS — J069 Acute upper respiratory infection, unspecified: Secondary | ICD-10-CM | POA: Diagnosis not present

## 2022-03-14 DIAGNOSIS — F32A Depression, unspecified: Secondary | ICD-10-CM | POA: Diagnosis not present

## 2022-03-14 DIAGNOSIS — E7841 Elevated Lipoprotein(a): Secondary | ICD-10-CM

## 2022-03-14 MED ORDER — ROSUVASTATIN CALCIUM 20 MG PO TABS: ORAL_TABLET | ORAL | 0 refills | Status: DC

## 2022-03-20 ENCOUNTER — Ambulatory Visit (INDEPENDENT_AMBULATORY_CARE_PROVIDER_SITE_OTHER): Payer: Medicare HMO | Admitting: Nurse Practitioner

## 2022-03-20 DIAGNOSIS — K644 Residual hemorrhoidal skin tags: Secondary | ICD-10-CM

## 2022-03-20 DIAGNOSIS — F411 Generalized anxiety disorder: Secondary | ICD-10-CM | POA: Diagnosis not present

## 2022-03-20 DIAGNOSIS — E039 Hypothyroidism, unspecified: Secondary | ICD-10-CM | POA: Diagnosis not present

## 2022-03-20 DIAGNOSIS — F32A Depression, unspecified: Secondary | ICD-10-CM

## 2022-03-20 MED ORDER — LORAZEPAM 0.5 MG PO TABS: 0.5000 mg | ORAL_TABLET | Freq: Every day | ORAL | 1 refills | Status: DC | PRN

## 2022-03-20 MED ORDER — HYDROCORTISONE ACETATE 25 MG RE SUPP: RECTAL | 2 refills | Status: DC

## 2022-03-20 MED ORDER — MIRTAZAPINE 15 MG PO TABS: ORAL_TABLET | ORAL | 2 refills | Status: DC
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
## 2022-04-25 ENCOUNTER — Encounter: Payer: Self-pay | Admitting: Nurse Practitioner

## 2022-04-25 ENCOUNTER — Ambulatory Visit (INDEPENDENT_AMBULATORY_CARE_PROVIDER_SITE_OTHER): Payer: Medicare HMO | Admitting: Family Medicine

## 2022-04-25 ENCOUNTER — Encounter: Payer: Self-pay | Admitting: Family Medicine

## 2022-04-25 VITALS — BP 120/74 | HR 98 | Resp 18 | Ht 67.0 in | Wt 128.0 lb

## 2022-04-25 DIAGNOSIS — J45909 Unspecified asthma, uncomplicated: Secondary | ICD-10-CM

## 2022-04-25 DIAGNOSIS — R634 Abnormal weight loss: Secondary | ICD-10-CM | POA: Diagnosis not present

## 2022-04-25 DIAGNOSIS — E039 Hypothyroidism, unspecified: Secondary | ICD-10-CM

## 2022-04-25 DIAGNOSIS — F411 Generalized anxiety disorder: Secondary | ICD-10-CM | POA: Diagnosis not present

## 2022-04-25 MED ORDER — ALBUTEROL SULFATE HFA 108 (90 BASE) MCG/ACT IN AERS: 2.0000 | INHALATION_SPRAY | Freq: Three times a day (TID) | RESPIRATORY_TRACT | 1 refills | Status: DC | PRN

## 2022-04-25 NOTE — Progress Notes (Signed)
   Established Patient Office Visit  Subjective   Patient ID: Damon [name]., male    DOB: 1965-07-15  Age: 57 y.o. MRN: [id number]  Chief Complaint  Patient presents with   Medical Management of Chronic Issues   Anxiety    HPI Kj R Christopherjohn Ghere. is a 57 y.o. male presenting today with his mother for follow up of mood.  Mood: Patient is here to follow up for generalized anxiety disorder, panic attacks. He is currently managing with Remeron daily at bedtime and Ativan as needed. Taking medication without side effects, reports excellent compliance with treatment.  He takes Remeron daily but has not needed to take Ativan for quite some time as he has experienced a significant decrease in panic attacks.  Denies mood changes or SI/HI. He feels his anxiety is stable since last visit. Denies chest pain, difficulty concentrating, dizziness, fatigue, insomnia, irritability, palpitations, panic attacks, racing thoughts, SOB, sweating. His mother has noticed that he has a low appetite and continues to lose weight which does concern her.  ROS Negative unless otherwise noted in HPI   Objective:     BP 120/74 (BP Location: Left Arm, Patient Position: Sitting, Cuff Size: Normal)   Pulse 98   Resp 18   Ht 5' 7"$  (1.702 m)   Wt 128 lb (58.1 kg)   SpO2 96%   BMI 20.05 kg/m   Physical Exam Constitutional:      General: He is not in acute distress.    Appearance: Normal appearance.  HENT:     Head: Normocephalic and atraumatic.  Cardiovascular:     Rate and Rhythm: Normal rate and regular rhythm.     Pulses: Normal pulses.     Heart sounds: Normal heart sounds.  Pulmonary:     Effort: Pulmonary effort is normal.     Breath sounds: Normal breath sounds.  Skin:    General: Skin is warm and dry.  Neurological:     Mental Status: He is alert and oriented to person, place, and time.  Psychiatric:        Mood and Affect: Mood normal.     Assessment & Plan:  Generalized anxiety  disorder Assessment & Plan: Stable.  Continue Remeron daily and Ativan as needed.  Will continue to monitor.   Acquired hypothyroidism Assessment & Plan: Checking TSH and T4 today.  Will change medication if lab results warrant necessity.  Orders: -     TSH; Future -     T4, free; Future  Weight loss -     TSH; Future -     T4, free; Future  Reactive airway disease without complication, unspecified asthma severity, unspecified whether persistent -     Albuterol Sulfate HFA; Inhale 2 puffs into the lungs every 8 (eight) hours as needed for wheezing or shortness of breath.  Dispense: 1 each; Refill: 1  If thyroid labs are normal then we may need to investigate other potential causes of decreased appetite and weight loss. Return in about 6 weeks (around 06/06/2022) for follow-up.    Velva Harman, PA

## 2022-04-25 NOTE — Assessment & Plan Note (Signed)
Checking TSH and T4 today.  Will change medication if lab results warrant necessity.

## 2022-04-25 NOTE — Assessment & Plan Note (Signed)
Stable.  Continue Remeron daily and Ativan as needed.  Will continue to monitor.

## 2022-04-26 LAB — T4, FREE: Free T4: 1.68 ng/dL (ref 0.82–1.77)

## 2022-04-26 LAB — TSH: TSH: 1.39 u[IU]/mL (ref 0.450–4.500)

## 2022-05-23 ENCOUNTER — Ambulatory Visit: Payer: Medicare HMO | Admitting: Neurology

## 2022-05-30 ENCOUNTER — Ambulatory Visit: Payer: Medicare HMO | Admitting: Family Medicine

## 2022-06-06 ENCOUNTER — Ambulatory Visit (INDEPENDENT_AMBULATORY_CARE_PROVIDER_SITE_OTHER): Payer: Medicare HMO | Admitting: Family Medicine

## 2022-06-06 ENCOUNTER — Encounter: Payer: Self-pay | Admitting: Family Medicine

## 2022-06-06 VITALS — BP 133/80 | HR 69 | Resp 18 | Ht 67.0 in | Wt 129.0 lb

## 2022-06-06 DIAGNOSIS — E039 Hypothyroidism, unspecified: Secondary | ICD-10-CM | POA: Diagnosis not present

## 2022-06-06 DIAGNOSIS — R5383 Other fatigue: Secondary | ICD-10-CM

## 2022-06-06 DIAGNOSIS — E559 Vitamin D deficiency, unspecified: Secondary | ICD-10-CM | POA: Diagnosis not present

## 2022-06-06 DIAGNOSIS — Z1159 Encounter for screening for other viral diseases: Secondary | ICD-10-CM | POA: Diagnosis not present

## 2022-06-06 DIAGNOSIS — M791 Myalgia, unspecified site: Secondary | ICD-10-CM | POA: Diagnosis not present

## 2022-06-06 DIAGNOSIS — R29898 Other symptoms and signs involving the musculoskeletal system: Secondary | ICD-10-CM | POA: Diagnosis not present

## 2022-06-06 NOTE — Progress Notes (Signed)
   Established Patient Office Visit  Subjective   Patient ID: Damon Carter., male    DOB: May 09, 1965  Age: 58 y.o. MRN: [id number]  Chief Complaint  Patient presents with   Medical Management of Chronic Issues   Hypothyroidism    HPI Damon R Jaivian Guillot. is a 57 y.o. male presenting today with his mother for follow up of hypothyroidism.  His appetite has improved and weight has stabilized.  There is still some concern for his ongoing fatigue and cold intolerance which has been happening for some time.  Thyroid levels were normal on last check.  Mood has also been stable.  Endorses frequent loose stools, denies frequent abdominal pain, nausea, vomiting.  Denies fever, night sweats, easy bruising. Hypothyroidism: Taking levothyroxine 50 mcg regularly in the AM away from food and vitamins. Denies weight changes, heat intolerance, skin/hair changes, bowel changes, CVS symptoms.  ROS Negative unless otherwise noted in HPI   Objective:     BP 133/80 (BP Location: Left Arm, Patient Position: Sitting, Cuff Size: Normal)   Pulse 69   Resp 18   Ht 5\' 7"  (1.702 m)   Wt 129 lb (58.5 kg)   SpO2 98%   BMI 20.20 kg/m   Physical Exam Vitals reviewed.  Constitutional:      General: Damon [name] is not in acute distress.    Appearance: Normal appearance. Damon [name] is not ill-appearing.  HENT:     Head: Normocephalic and atraumatic.     Nose: Nose normal.  Eyes:     Conjunctiva/sclera: Conjunctivae normal.  Pulmonary:     Effort: Pulmonary effort is normal.  Musculoskeletal:     Cervical back: Normal range of motion.  Skin:    Coloration: Skin is not jaundiced or pale.     Findings: No bruising.  Neurological:     Mental Status: Damon [name] is alert and oriented to person, place, and time.  Psychiatric:        Mood and Affect: Mood normal.     Assessment & Plan:  Hypothyroidism, unspecified type  Fatigue, unspecified type Assessment & Plan: History of hypothyroidism currently well-controlled with most  recent TSH and T4 within normal limits.  With ongoing fatigue and cold intolerance, workup to include CBC and CMP, vitamin D and B12 deficiencies, ferritin, ESR/CRP, and hepatitis C.  If normal, may consider 8 AM cortisol as next step.  We may also discuss a referral to endocrinology.  Orders: -     CBC with Differential/Platelet; Future -     Comprehensive metabolic panel; Future -     VITAMIN D 25 Hydroxy (Vit-D Deficiency, Fractures); Future -     Vitamin B12; Future -     Ferritin -     Hepatitis C antibody; Future -     Sedimentation rate -     C-reactive protein  Screening for viral disease -     Hepatitis C antibody; Future    Return in about 4 weeks (around 07/04/2022) for follow-up for fatigue, review lab work if necessary.   I spent 30 minutes on the day of the encounter to include pre-visit record review, face-to-face time with the patient and post visit ordering of test.  Velva Harman, PA

## 2022-06-06 NOTE — Patient Instructions (Signed)
Make an appointment for about a month from now on the way out so that we have something scheduled if we need it.  Depending on the lab results, I will send you a message on MyChart to discuss next steps.

## 2022-06-06 NOTE — Assessment & Plan Note (Addendum)
History of hypothyroidism currently well-controlled with most recent TSH and T4 within normal limits.  With ongoing fatigue and cold intolerance, workup to include CBC and CMP, vitamin D and B12 deficiencies, ferritin, ESR/CRP, and hepatitis C.  If normal, may consider 8 AM cortisol as next step.  We may also discuss a referral to endocrinology.

## 2022-06-07 LAB — COMPREHENSIVE METABOLIC PANEL
ALT: 10 IU/L (ref 0–44)
AST: 15 IU/L (ref 0–40)
Albumin/Globulin Ratio: 1.7 (ref 1.2–2.2)
Albumin: 4.3 g/dL (ref 3.8–4.9)
Alkaline Phosphatase: 87 IU/L (ref 44–121)
BUN/Creatinine Ratio: 6 — ABNORMAL LOW (ref 9–20)
BUN: 8 mg/dL (ref 6–24)
Bilirubin Total: 0.5 mg/dL (ref 0.0–1.2)
CO2: 25 mmol/L (ref 20–29)
Calcium: 9.3 mg/dL (ref 8.7–10.2)
Chloride: 100 mmol/L (ref 96–106)
Creatinine, Ser: 1.31 mg/dL — ABNORMAL HIGH (ref 0.76–1.27)
Globulin, Total: 2.6 g/dL (ref 1.5–4.5)
Glucose: 74 mg/dL (ref 70–99)
Potassium: 3.6 mmol/L (ref 3.5–5.2)
Sodium: 140 mmol/L (ref 134–144)
Total Protein: 6.9 g/dL (ref 6.0–8.5)
eGFR: 63 mL/min/{1.73_m2} (ref >59)

## 2022-06-07 LAB — CBC WITH DIFFERENTIAL/PLATELET
Basophils Absolute: 0 10*3/uL (ref 0.0–0.2)
Basos: 1 %
EOS (ABSOLUTE): 0 10*3/uL (ref 0.0–0.4)
Eos: 1 %
Hematocrit: 43.4 % (ref 37.5–51.0)
Hemoglobin: 14.1 g/dL (ref 13.0–17.7)
Immature Grans (Abs): 0 10*3/uL (ref 0.0–0.1)
Immature Granulocytes: 0 %
Lymphocytes Absolute: 0.9 10*3/uL (ref 0.7–3.1)
Lymphs: 19 %
MCH: 27.8 pg (ref 26.6–33.0)
MCHC: 32.5 g/dL (ref 31.5–35.7)
MCV: 85 fL (ref 79–97)
Monocytes Absolute: 0.2 10*3/uL (ref 0.1–0.9)
Monocytes: 5 %
Neutrophils Absolute: 3.4 10*3/uL (ref 1.4–7.0)
Neutrophils: 74 %
Platelets: 171 10*3/uL (ref 150–450)
RBC: 5.08 x10E6/uL (ref 4.14–5.80)
RDW: 12.2 % (ref 11.6–15.4)
WBC: 4.6 10*3/uL (ref 3.4–10.8)

## 2022-06-07 LAB — HEPATITIS C ANTIBODY: Hep C Virus Ab: NONREACTIVE

## 2022-06-07 LAB — C-REACTIVE PROTEIN: CRP: 2 mg/L (ref 0–10)

## 2022-06-07 LAB — SEDIMENTATION RATE: Sed Rate: 17 mm/hr (ref 0–30)

## 2022-06-07 LAB — FERRITIN: Ferritin: 16 ng/mL — ABNORMAL LOW (ref 30–400)

## 2022-06-07 LAB — VITAMIN D 25 HYDROXY (VIT D DEFICIENCY, FRACTURES): Vit D, 25-Hydroxy: 56.6 ng/mL (ref 30.0–100.0)

## 2022-06-07 LAB — VITAMIN B12: Vitamin B-12: 394 pg/mL (ref 232–1245)

## 2022-06-10 ENCOUNTER — Other Ambulatory Visit: Payer: Self-pay | Admitting: Nurse Practitioner

## 2022-06-10 DIAGNOSIS — F32A Depression, unspecified: Secondary | ICD-10-CM

## 2022-06-19 ENCOUNTER — Other Ambulatory Visit: Payer: Self-pay | Admitting: Nurse Practitioner

## 2022-06-19 DIAGNOSIS — E785 Hyperlipidemia, unspecified: Secondary | ICD-10-CM

## 2022-06-19 DIAGNOSIS — E7841 Elevated Lipoprotein(a): Secondary | ICD-10-CM

## 2022-06-24 ENCOUNTER — Telehealth: Payer: Self-pay

## 2022-06-24 DIAGNOSIS — E785 Hyperlipidemia, unspecified: Secondary | ICD-10-CM

## 2022-06-24 DIAGNOSIS — E7841 Elevated Lipoprotein(a): Secondary | ICD-10-CM

## 2022-06-24 DIAGNOSIS — E039 Hypothyroidism, unspecified: Secondary | ICD-10-CM

## 2022-06-24 MED ORDER — LEVOTHYROXINE SODIUM 50 MCG PO TABS
50.0000 ug | ORAL_TABLET | Freq: Every day | ORAL | 0 refills | Status: DC
Start: 2022-06-24 — End: 2022-09-12

## 2022-06-24 MED ORDER — ROSUVASTATIN CALCIUM 20 MG PO TABS
ORAL_TABLET | ORAL | 0 refills | Status: DC
Start: 2022-06-24 — End: 2022-09-23

## 2022-06-24 NOTE — Telephone Encounter (Signed)
[facility] called to see if order could be sent for patients medications levothyroxine  and , rosivastatin , patient recently switched pharmacies, from [facility] to [facility], thanks.

## 2022-06-24 NOTE — Telephone Encounter (Signed)
Refill sent.

## 2022-07-03 NOTE — Progress Notes (Deleted)
   Established Patient Office Visit  Subjective   Patient ID: Eon Brake., male    DOB: 10-08-1965  Age: 57 y.o. MRN: [id number]  No chief complaint on file.   HPI Aubrey R Cephus Bieschke. is a 57 y.o. male presenting today for follow up of fatigue.  Most recent thyroid labs within normal limits, mood has been stable.  Weight and appetite have been stable.  On last check, ferritin levels were low.  ROS Negative unless otherwise noted in HPI   Objective:     There were no vitals taken for this visit.  Physical Exam   Assessment & Plan:  Fatigue, unspecified type  Decreased ferritin    No follow-ups on file.    Melida Quitter, PA

## 2022-07-04 ENCOUNTER — Ambulatory Visit: Payer: Medicare HMO | Admitting: Family Medicine

## 2022-07-04 NOTE — Assessment & Plan Note (Deleted)
Followed by neurology, most recent visit 01/29/2022.  Continue Keppra 1000 mg twice daily.

## 2022-07-08 ENCOUNTER — Other Ambulatory Visit: Payer: Self-pay | Admitting: Family Medicine

## 2022-07-08 ENCOUNTER — Other Ambulatory Visit: Payer: Self-pay | Admitting: Nurse Practitioner

## 2022-07-08 DIAGNOSIS — E039 Hypothyroidism, unspecified: Secondary | ICD-10-CM

## 2022-07-08 DIAGNOSIS — F32A Depression, unspecified: Secondary | ICD-10-CM

## 2022-08-13 DIAGNOSIS — K648 Other hemorrhoids: Secondary | ICD-10-CM | POA: Diagnosis not present

## 2022-08-13 DIAGNOSIS — R1012 Left upper quadrant pain: Secondary | ICD-10-CM | POA: Diagnosis not present

## 2022-08-13 DIAGNOSIS — R1032 Left lower quadrant pain: Secondary | ICD-10-CM | POA: Diagnosis not present

## 2022-08-27 ENCOUNTER — Other Ambulatory Visit: Payer: Self-pay

## 2022-08-27 MED ORDER — LEVETIRACETAM 1000 MG PO TABS: ORAL_TABLET | ORAL | 3 refills | Status: DC

## 2022-08-27 NOTE — Telephone Encounter (Signed)
New pharmacy [facility] request script for Keppra.

## 2022-09-02 ENCOUNTER — Other Ambulatory Visit: Payer: Self-pay | Admitting: Family Medicine

## 2022-09-02 DIAGNOSIS — E039 Hypothyroidism, unspecified: Secondary | ICD-10-CM

## 2022-09-02 DIAGNOSIS — F32A Depression, unspecified: Secondary | ICD-10-CM

## 2022-09-12 ENCOUNTER — Other Ambulatory Visit: Payer: Self-pay | Admitting: Family Medicine

## 2022-09-12 DIAGNOSIS — E039 Hypothyroidism, unspecified: Secondary | ICD-10-CM

## 2022-09-12 DIAGNOSIS — F32A Depression, unspecified: Secondary | ICD-10-CM

## 2022-09-12 MED ORDER — MIRTAZAPINE 15 MG PO TABS
ORAL_TABLET | ORAL | 0 refills | Status: DC
Start: 2022-09-12 — End: 2022-09-23

## 2022-09-12 MED ORDER — LEVOTHYROXINE SODIUM 50 MCG PO TABS
50.0000 ug | ORAL_TABLET | Freq: Every day | ORAL | 0 refills | Status: DC
Start: 2022-09-12 — End: 2023-02-10

## 2022-09-12 NOTE — Telephone Encounter (Signed)
Pt mom came in office today to check about the refills and I told her we needed to get patient an appointment scheduled due to missing last appointment.

## 2022-09-12 NOTE — Telephone Encounter (Signed)
I will send in prescriptions for the next 3 months since I am booked out fairly far.  If he can get in with Dr. Constance Goltz sooner that is okay to, but there will be no other refills until he does have an in person visit.

## 2022-09-16 DIAGNOSIS — R1013 Epigastric pain: Secondary | ICD-10-CM | POA: Diagnosis not present

## 2022-09-16 DIAGNOSIS — R319 Hematuria, unspecified: Secondary | ICD-10-CM | POA: Diagnosis not present

## 2022-09-16 DIAGNOSIS — R634 Abnormal weight loss: Secondary | ICD-10-CM | POA: Diagnosis not present

## 2022-09-16 DIAGNOSIS — R42 Dizziness and giddiness: Secondary | ICD-10-CM | POA: Diagnosis not present

## 2022-09-16 DIAGNOSIS — R110 Nausea: Secondary | ICD-10-CM | POA: Diagnosis not present

## 2022-09-16 DIAGNOSIS — R1033 Periumbilical pain: Secondary | ICD-10-CM | POA: Diagnosis not present

## 2022-09-16 DIAGNOSIS — R1084 Generalized abdominal pain: Secondary | ICD-10-CM | POA: Diagnosis not present

## 2022-09-22 ENCOUNTER — Emergency Department (HOSPITAL_COMMUNITY)
Admission: EM | Admit: 2022-09-22 | Discharge: 2022-09-22 | Disposition: A | Discharge status: Home / Self Care | Payer: Medicare HMO | Attending: Emergency Medicine | Admitting: Emergency Medicine

## 2022-09-22 ENCOUNTER — Encounter (HOSPITAL_COMMUNITY): Payer: Self-pay

## 2022-09-22 ENCOUNTER — Emergency Department (HOSPITAL_COMMUNITY): Payer: Medicare HMO

## 2022-09-22 DIAGNOSIS — N281 Cyst of kidney, acquired: Secondary | ICD-10-CM | POA: Diagnosis not present

## 2022-09-22 DIAGNOSIS — N201 Calculus of ureter: Secondary | ICD-10-CM | POA: Insufficient documentation

## 2022-09-22 DIAGNOSIS — N200 Calculus of kidney: Secondary | ICD-10-CM | POA: Diagnosis not present

## 2022-09-22 DIAGNOSIS — K8020 Calculus of gallbladder without cholecystitis without obstruction: Secondary | ICD-10-CM | POA: Diagnosis not present

## 2022-09-22 DIAGNOSIS — R319 Hematuria, unspecified: Secondary | ICD-10-CM | POA: Diagnosis present

## 2022-09-22 LAB — URINALYSIS, ROUTINE W REFLEX MICROSCOPIC
Bilirubin Urine: NEGATIVE
Glucose, UA: NEGATIVE mg/dL
Ketones, ur: NEGATIVE mg/dL
Leukocytes,Ua: NEGATIVE
Nitrite: NEGATIVE
Protein, ur: 100 mg/dL — AB
RBC / HPF: >50 RBC/hpf (ref 0–5)
Specific Gravity, Urine: 1.014 (ref 1.005–1.030)
pH: 5 (ref 5.0–8.0)

## 2022-09-22 LAB — CBC
HCT: 45.1 % (ref 39.0–52.0)
Hemoglobin: 14.3 g/dL (ref 13.0–17.0)
MCH: 27.9 pg (ref 26.0–34.0)
MCHC: 31.7 g/dL (ref 30.0–36.0)
MCV: 87.9 fL (ref 80.0–100.0)
Platelets: 140 10*3/uL — ABNORMAL LOW (ref 150–400)
RBC: 5.13 MIL/uL (ref 4.22–5.81)
RDW: 13.8 % (ref 11.5–15.5)
WBC: 4.9 10*3/uL (ref 4.0–10.5)
nRBC: 0 % (ref 0.0–0.2)

## 2022-09-22 LAB — COMPREHENSIVE METABOLIC PANEL
ALT: 21 U/L (ref 0–44)
AST: 25 U/L (ref 15–41)
Albumin: 4.2 g/dL (ref 3.5–5.0)
Alkaline Phosphatase: 52 U/L (ref 38–126)
Anion gap: 8 (ref 5–15)
BUN: 13 mg/dL (ref 6–20)
CO2: 27 mmol/L (ref 22–32)
Calcium: 9.2 mg/dL (ref 8.9–10.3)
Chloride: 105 mmol/L (ref 98–111)
Creatinine, Ser: 1.15 mg/dL (ref 0.61–1.24)
GFR, Estimated: >60 mL/min (ref >60)
Glucose, Bld: 91 mg/dL (ref 70–99)
Potassium: 4 mmol/L (ref 3.5–5.1)
Sodium: 140 mmol/L (ref 135–145)
Total Bilirubin: 0.5 mg/dL (ref 0.3–1.2)
Total Protein: 7.4 g/dL (ref 6.5–8.1)

## 2022-09-22 LAB — LIPASE, BLOOD: Lipase: 47 U/L (ref 11–51)

## 2022-09-22 MED ORDER — OXYCODONE-ACETAMINOPHEN 5-325 MG PO TABS: 1.0000 | ORAL_TABLET | Freq: Four times a day (QID) | ORAL | 0 refills | Status: DC | PRN

## 2022-09-22 NOTE — ED Notes (Signed)
Discharge papers reviewed with pt and family. Pt dc in stable condition from ED

## 2022-09-22 NOTE — Discharge Instructions (Addendum)
I discussed with one of our urology doctors who specialize in kidney stones.  They would like you to call tomorrow to make an appointment in their clinic.  This stone may require surgery or intervention however this is not an emergent intervention that is necessary.  Please use Tylenol or ibuprofen for pain.  You may use 600 mg ibuprofen every 6 hours or 1000 mg of Tylenol every 6 hours.  You may choose to alternate between the 2.  This would be most effective.  Not to exceed 4 g of Tylenol within 24 hours.  Not to exceed 3200 mg ibuprofen 24 hours.   I have given you several tablets of Percocet for breakthrough pain if you experience any severe pain however you very well may not have any episodes of pain.

## 2022-09-22 NOTE — ED Provider Notes (Signed)
[place] EMERGENCY DEPARTMENT AT [facility] Provider Note   CSN: [id number] Arrival date & time: 09/22/22  1730     History  Chief Complaint  Patient presents with   Hematuria    Damon R Karson Reede. is a 57 y.o. male.  HPI  Patient is a 57 year old male with past medical history significant for ureterolithiasis presenting to the emergency room today with some right flank pain that occurred earlier today and then resolved then had some right lower quadrant abdominal pain that has now resolved.  He is pain-free but states that he has had blood in his urine today.  Denies any burning with urination no frequency urgency or abdominal pain.  No fevers chills lightheadedness or dizziness no nausea or vomiting.  He denies any symptoms currently    Home Medications Prior to Admission medications   Medication Sig Start Date End Date Taking? Authorizing Provider  oxyCODONE-acetaminophen (PERCOCET/ROXICET) 5-325 MG tablet Take 1 tablet by mouth every 6 (six) hours as needed for severe pain. 09/22/22  Yes Wanda Cellucci S, PA  albuterol (VENTOLIN HFA) 108 (90 Base) MCG/ACT inhaler Inhale 2 puffs into the lungs every 8 (eight) hours as needed for wheezing or shortness of breath. 04/25/22 05/25/22  Saralyn Pilar A, PA  AMBULATORY NON FORMULARY MEDICATION Nitroglycerin 0.125% gel applied topically every evening 06/02/20   Hilarie Fredrickson, MD  Cholecalciferol ([employer] VIT D 5000 HIGH-POTENCY PO) Take 1 tablet by mouth daily.    [provider]  famotidine (PEPCID) 20 MG tablet TAKE 1 TABLET(20 MG) BY MOUTH DAILY 09/15/20   Mayer Masker, PA-C  HYDROcodone-acetaminophen (NORCO/VICODIN) 5-325 MG tablet Take 1 tablet by mouth every 6 (six) hours as needed. 07/21/20   Charlynne Pander, MD  hydrocortisone (ANUSOL-HC) 25 MG suppository Use one rectally at night prn 03/20/22   Carlean Jews, NP  levETIRAcetam (KEPPRA) 1000 MG tablet Take 1 tablet BID 08/27/22   Drema Dallas, DO   levothyroxine (SYNTHROID) 50 MCG tablet Take 1 tablet (50 mcg total) by mouth daily. 09/12/22   Melida Quitter, PA  LORazepam (ATIVAN) 0.5 MG tablet Take 1 tablet (0.5 mg total) by mouth daily as needed for anxiety. 03/20/22   Carlean Jews, NP  mirtazapine (REMERON) 15 MG tablet TAKE 1 TABLET BY MOUTH AT BEDTIME. 09/12/22   Saralyn Pilar A, PA  ondansetron (ZOFRAN ODT) 4 MG disintegrating tablet Take 1 tablet (4 mg total) by mouth every 8 (eight) hours as needed for nausea or vomiting. 07/21/20   Charlynne Pander, MD  pantoprazole (PROTONIX) 40 MG tablet TAKE 1 TABLET(40 MG) BY MOUTH TWICE DAILY 12/22/20   Arnaldo Natal, NP  rosuvastatin (CRESTOR) 20 MG tablet TAKE 1 TABLET(20 MG) BY MOUTH DAILY 06/24/22   Melida Quitter, PA      Allergies    Bactrim [sulfamethoxazole-trimethoprim]    Review of Systems   Review of Systems  Physical Exam Updated Vital Signs BP (!) 161/91   Pulse 61   Temp 97.7 F (36.5 C)   Resp 20   SpO2 96%  Physical Exam Vitals and nursing note reviewed.  Constitutional:      General: He is not in acute distress. HENT:     Head: Normocephalic and atraumatic.     Nose: Nose normal.  Eyes:     General: No scleral icterus. Cardiovascular:     Rate and Rhythm: Normal rate and regular rhythm.     Pulses: Normal pulses.  Heart sounds: Normal heart sounds.  Pulmonary:     Effort: Pulmonary effort is normal. No respiratory distress.     Breath sounds: No wheezing.  Abdominal:     Palpations: Abdomen is soft.     Tenderness: There is no abdominal tenderness.  Musculoskeletal:     Cervical back: Normal range of motion.     Right lower leg: No edema.     Left lower leg: No edema.  Skin:    General: Skin is warm and dry.     Capillary Refill: Capillary refill takes less than 2 seconds.  Neurological:     Mental Status: He is alert. Mental status is at baseline.  Psychiatric:        Mood and Affect: Mood normal.        Behavior:  Behavior normal.     ED Results / Procedures / Treatments   Labs (all labs ordered are listed, but only abnormal results are displayed) Labs Reviewed  CBC - Abnormal; Notable for the following components:      Result Value   Platelets 140 (*)    All other components within normal limits  URINALYSIS, ROUTINE W REFLEX MICROSCOPIC - Abnormal; Notable for the following components:   Color, Urine AMBER (*)    APPearance CLOUDY (*)    Hgb urine dipstick LARGE (*)    Protein, ur 100 (*)    Bacteria, UA MANY (*)    All other components within normal limits  LIPASE, BLOOD  COMPREHENSIVE METABOLIC PANEL    EKG None  Radiology CT Renal Stone Study  Result Date: 09/22/2022 CLINICAL DATA:  Right lower quadrant abdominal dark urine. EXAM: CT ABDOMEN AND PELVIS WITHOUT CONTRAST TECHNIQUE: Multidetector CT imaging of the abdomen and pelvis was performed following the standard protocol without IV contrast. RADIATION DOSE REDUCTION: This exam was performed according to the departmental dose-optimization program which includes automated exposure control, adjustment of the mA and/or kV according to patient size and/or use of iterative reconstruction technique. COMPARISON:  CT examination dated Jul 21, 2020 FINDINGS: Lower chest: No acute abnormality. Hepatobiliary: No focal liver abnormality is seen. Small dependent gallstones without gallbladder wall thickening, or biliary dilatation. Pancreas: Unremarkable. No pancreatic ductal dilatation or surrounding inflammatory changes. Spleen: Normal in size without focal abnormality. Adrenals/Urinary Tract: Adrenal glands are unremarkable. Large staghorn right renal pelvis calculus measuring at least 2.6 x 2.0 cm with mild adjacent fat stranding concerning for intermittent or partial obstruction. There is a 6 x 3 mm calculus in the left kidney. No left hydronephrosis or ureteral calculus. 2.5 cm simple cysts in the lower pole of the left kidney. Bladder is  unremarkable. Stomach/Bowel: Stomach is within normal limits. Appendix appears normal. No evidence of bowel wall thickening, distention, or inflammatory changes. Vascular/Lymphatic: Mild aortic atherosclerosis. No enlarged abdominal or pelvic lymph nodes. Reproductive: Prostate is mildly enlarged measuring up to 5.7 cm. Other: No abdominal wall hernia or abnormality. No abdominopelvic ascites. Musculoskeletal: No acute or significant osseous findings. Of the lumbar spine. No acute osseous abnormality. IMPRESSION: 1. Large calculus in the right renal pelvis measuring at least 2.6 x 2.0 cm with mild adjacent fat stranding concerning for intermittent or partial obstruction. 2. 6 x 3 mm nonobstructing calculus in the left kidney. No ureteral calculus identified. 3. Cholelithiasis without evidence of acute cholecystitis. 4. Mild prostatomegaly. 5. No CT evidence of acute abdominal/pelvic process. 6. Aortic atherosclerosis. Aortic Atherosclerosis (ICD10-I70.0). Electronically Signed   By: Larose Hires D.O.   On: 09/22/2022  21:09    Procedures Procedures    Medications Ordered in ED Medications - No data to display  ED Course/ Medical Decision Making/ A&P Clinical Course as of 09/22/22 2329  Wynelle Link Sep 22, 2022  2232 Damon Mosses.  [WF]    Clinical Course User Index [WF] Gailen Shelter, [place]                             Medical Decision Making Amount and/or Complexity of Data Reviewed Labs: ordered. Radiology: ordered.  Risk Prescription drug management.   Patient is a 57 year old male with past medical history significant for ureterolithiasis presenting to the emergency room today with some right flank pain that occurred earlier today and then resolved then had some right lower quadrant abdominal pain that has now resolved.  He is pain-free but states that he has had blood in his urine today.  Denies any burning with urination no frequency urgency or abdominal pain.  No fevers chills lightheadedness or  dizziness no nausea or vomiting.  He denies any symptoms currently   The differential diagnosis of emergent flank pain includes, but is not limited to :Abdominal aortic aneurysm,, Renal artery embolism,Renal vein thrombosis, Aortic dissection, Mesenteric ischemia, Pyelonephritis, Renal infarction, Renal hemorrhage, Nephrolithiasis/ Renal Colic, Bladder tumor,Cystitis, Biliary colic, Pancreatitis Perforated peptic ulcer Appendicitis ,Inguinal Hernia, Diverticulitis, Bowel obstruction Testicular torsion,Epididymitis Shingles Lower lobe pneumonia, Retroperitoneal hematoma/abscess/tumor, Epidural abscess, Epidural hematoma   CT renal stone study with extremely large 26 x 20 mm stone.  Discussed with urology Dr. Lafonda Mosses -who recommends outpatient follow-up.  Urinalysis without evidence of infection.  CMP CBC lipase normal.  Does have a large prostate.  Cholelithiasis but no right upper quadrant abdominal tenderness no leukocytosis fevers or abdominal pain.  I discussed this case with my attending physician who cosigned this note including patient's presenting symptoms, physical exam, and planned diagnostics and interventions. Attending physician stated agreement with plan or made changes to plan which were implemented.   He will follow-up outpatient with [facility].  Return precautions discussed.  Will discharge home with Percocet if he does develop severe pain.    Final Clinical Impression(s) / ED Diagnoses Final diagnoses:  Ureterolithiasis    Rx / DC Orders ED Discharge Orders          Ordered    oxyCODONE-acetaminophen (PERCOCET/ROXICET) 5-325 MG tablet  Every 6 hours PRN        09/22/22 2247              Gailen Shelter, [place] 09/22/22 2330    Lonell Grandchild, MD 09/25/22 1506

## 2022-09-22 NOTE — ED Triage Notes (Signed)
Pt arrived via POV with spouse. Pt has been c/o RLQ abd pain and dark urine.   Pt is deaf, did not want sign language interpreter. Okay with spouse to translate

## 2022-09-23 ENCOUNTER — Other Ambulatory Visit: Payer: Self-pay | Admitting: Family Medicine

## 2022-09-23 ENCOUNTER — Encounter: Payer: Self-pay | Admitting: Family Medicine

## 2022-09-23 ENCOUNTER — Telehealth: Payer: Self-pay

## 2022-09-23 ENCOUNTER — Ambulatory Visit (INDEPENDENT_AMBULATORY_CARE_PROVIDER_SITE_OTHER): Payer: Medicare HMO | Admitting: Family Medicine

## 2022-09-23 VITALS — BP 135/85 | HR 66 | Resp 18 | Ht 67.0 in | Wt 118.0 lb

## 2022-09-23 DIAGNOSIS — F411 Generalized anxiety disorder: Secondary | ICD-10-CM | POA: Diagnosis not present

## 2022-09-23 DIAGNOSIS — E7841 Elevated Lipoprotein(a): Secondary | ICD-10-CM

## 2022-09-23 DIAGNOSIS — E785 Hyperlipidemia, unspecified: Secondary | ICD-10-CM

## 2022-09-23 DIAGNOSIS — R627 Adult failure to thrive: Secondary | ICD-10-CM

## 2022-09-23 MED ORDER — MIRTAZAPINE 30 MG PO TABS: 30.0000 mg | ORAL_TABLET | Freq: Every day | ORAL | 1 refills | Status: DC

## 2022-09-23 NOTE — Assessment & Plan Note (Signed)
Increase mirtazapine to 30 mg daily. May increase to 45 mg daily if needed.

## 2022-09-23 NOTE — Patient Instructions (Signed)
Try adding a protein shake or two to his daily intake. It is important to maximize his protein intake and caloric intake to prevent any further weight loss.

## 2022-09-23 NOTE — Telephone Encounter (Signed)
Transition Care Management Follow-up Telephone Call Date of discharge and from where: 09/22/2022 [name] How have you been since you were released from the hospital? Patient is feeling better but still has some pain. Any questions or concerns? No  Items Reviewed: Did the pt receive and understand the discharge instructions provided? Yes  Medications obtained and verified? Yes  Other? No  Any new allergies since your discharge? No  Dietary orders reviewed? Yes Do you have support at home? Yes   Follow up appointments reviewed:  PCP Hospital f/u appt confirmed? Yes  Scheduled to see Damon Pilar, PA on 09/23/2022 @ [name] Specialist Hospital f/u appt confirmed? No  Scheduled to see  on  @ . Are transportation arrangements needed? No  If their condition worsens, is the pt aware to call PCP or go to the Emergency Dept.? Yes Was the patient provided with contact information for the PCP's office or ED? Yes Was to pt encouraged to call back with questions or concerns? Yes  [name]  [name] Community Resource Care Guide   [email or web address]  ?? [phone]   Website: [email or web address]  [email or web address]

## 2022-09-23 NOTE — Progress Notes (Signed)
   Established Patient Office Visit  Subjective   Patient ID: Damon [name]., male    DOB: Oct 28, 1965  Age: 57 y.o. MRN: [id number]  Chief Complaint  Patient presents with   Fatigue   Weight Loss    HPI Damon [name]. is a 57 y.o. male presenting today for follow up of fatigue. At last appointment, his appetite had improved and weight had stabilized. There was still some concern for his ongoing fatigue and cold intolerance which had been happening for some time. Thyroid levels were normal on last check. Mood had also been stable. Endorsed frequent loose stools, denied frequent abdominal pain, nausea, vomiting. Denied fever, night sweats, easy bruising. Workup at the time included CBC, CMP, vitamin D, vitamin B12, ferritin, ESR/CRP, and hepatitis C. Labs within normal limits other than low ferritin, suggested to start iron supplement. He continues to lose weight and have a low appetite. His mother expresses concern about his mood affecting his appetite, the mirtazapine does not seem to suffice for mood or appetite at this point.  ROS Negative unless otherwise noted in HPI   Objective:     BP 135/85 (BP Location: Left Arm, Patient Position: Sitting, Cuff Size: Normal)   Pulse 66   Resp 18   Ht 5\' 7"  (1.702 m)   Wt 118 lb (53.5 kg)   SpO2 98%   BMI 18.48 kg/m   Physical Exam Constitutional:      General: He is not in acute distress.    Appearance: Normal appearance. He is underweight.  HENT:     Head: Normocephalic and atraumatic.  Cardiovascular:     Rate and Rhythm: Normal rate and regular rhythm.     Heart sounds: Normal heart sounds. No murmur heard.    No friction rub. No gallop.  Pulmonary:     Effort: Pulmonary effort is normal. No respiratory distress.     Breath sounds: Normal breath sounds. No wheezing, rhonchi or rales.  Skin:    General: Skin is warm and dry.  Neurological:     Mental Status: He is alert and oriented to person, place, and time.   Psychiatric:        Mood and Affect: Mood normal.      Assessment & Plan:  Generalized anxiety disorder Assessment & Plan: Increase mirtazapine to 30 mg daily. May increase to 45 mg daily if needed.  Orders: -     Mirtazapine; Take 1 tablet (30 mg total) by mouth at bedtime. May increase to 1.5 tablets (45 mg total) if needed after 2 weeks.  Dispense: 90 tablet; Refill: 1  FTT (failure to thrive) in adult Assessment & Plan: Discussed options of increasing mirtazapine or adding SSRI to improve mood and increase appetite vs. starting megestrol acetate. Patient prefers to increase mirtazapine to start. Follow up in 4 week to assess efficacy. Also recommend adding protein shakes daily.  Orders: -     Mirtazapine; Take 1 tablet (30 mg total) by mouth at bedtime. May increase to 1.5 tablets (45 mg total) if needed after 2 weeks.  Dispense: 90 tablet; Refill: 1    Return in about 4 weeks (around 10/21/2022) for follow-up for mood and weight loss.    Melida Quitter, PA

## 2022-09-23 NOTE — Assessment & Plan Note (Signed)
Discussed options of increasing mirtazapine or adding SSRI to improve mood and increase appetite vs. starting megestrol acetate. Patient prefers to increase mirtazapine to start. Follow up in 4 week to assess efficacy. Also recommend adding protein shakes daily.

## 2022-09-26 DIAGNOSIS — N200 Calculus of kidney: Secondary | ICD-10-CM | POA: Diagnosis not present

## 2022-09-27 ENCOUNTER — Other Ambulatory Visit: Payer: Self-pay | Admitting: Urology

## 2022-09-27 ENCOUNTER — Other Ambulatory Visit (HOSPITAL_COMMUNITY): Payer: Self-pay | Admitting: Urology

## 2022-09-27 DIAGNOSIS — N200 Calculus of kidney: Secondary | ICD-10-CM

## 2022-10-22 ENCOUNTER — Encounter: Payer: Self-pay | Admitting: Family Medicine

## 2022-10-22 ENCOUNTER — Ambulatory Visit (INDEPENDENT_AMBULATORY_CARE_PROVIDER_SITE_OTHER): Payer: Medicare HMO | Admitting: Family Medicine

## 2022-10-22 VITALS — BP 134/84 | HR 68 | Resp 18 | Ht 67.0 in | Wt 110.0 lb

## 2022-10-22 DIAGNOSIS — R627 Adult failure to thrive: Secondary | ICD-10-CM | POA: Diagnosis not present

## 2022-10-22 MED ORDER — MEGESTROL ACETATE 625 MG/5ML PO SUSP
625.0000 mg | Freq: Every day | ORAL | 0 refills | Status: DC
Start: 2022-10-22 — End: 2022-12-19

## 2022-10-22 NOTE — Assessment & Plan Note (Signed)
Appetite remains low, weight has decreased an additional 8 pounds over the last month.  Continue mirtazapine 30 mg daily at bedtime, add megestrol 625 mg daily to stimulate appetite.  After his surgery, it would also be beneficial to start a daily anxiety medication if he is agreeable to it.  Will continue to monitor.  We also discussed the importance of maintaining adequate caloric and protein intake.  We discussed that there are many options of protein shakes, as long as there is a high protein content the brand and flavor is not as important while we try to maintain and then increase his weight.

## 2022-10-22 NOTE — Progress Notes (Signed)
   Established Patient Office Visit  Subjective   Patient ID: [facility]on [name]., male    DOB: 1965/06/03  Age: 57 y.o. MRN: [id number]  Chief Complaint  Patient presents with   Anxiety   Depression   Weight Loss    HPI Aharon R Kazen Armijo. is a 57 y.o. male presenting today for follow up of mood and weight loss with his mother and father.  His mother reports that he continues to have a low appetite, often eating one half or less of each meal.  He does not like and will not have Ensure protein shakes.  He continues to lose weight.  He was found to have a large kidney stone and is scheduled for surgery at the end of September.  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  Increasing mirtazapine at the last appointment does not seem to have improved his appetite.  ROS Negative unless otherwise noted in HPI   Objective:     BP 134/84 (BP Location: Left Arm, Patient Position: Sitting, Cuff Size: Normal)   Pulse 68   Resp 18   Ht 5\' 7"  (1.702 m)   Wt 110 lb (49.9 kg)   SpO2 97%   BMI 17.23 kg/m   Physical Exam Vitals reviewed.  Constitutional:      General: He is not in acute distress.    Appearance: Normal appearance. He is not ill-appearing.  HENT:     Head: Normocephalic and atraumatic.     Nose: Nose normal.  Eyes:     Conjunctiva/sclera: Conjunctivae normal.  Pulmonary:     Effort: Pulmonary effort is normal.  Musculoskeletal:     Cervical back: Normal range of motion.  Skin:    Coloration: Skin is not jaundiced or pale.     Findings: No bruising.  Neurological:     Mental Status: He is alert and oriented to person, place, and time.  Psychiatric:        Mood and Affect: Mood normal.     Assessment & Plan:  FTT (failure to thrive) in adult Assessment & Plan: Appetite remains low, weight has decreased an additional 8 pounds over the last month.  Continue mirtazapine 30 mg daily at bedtime, add megestrol 625 mg daily to stimulate appetite.  After  his surgery, it would also be beneficial to start a daily anxiety medication if he is agreeable to it.  Will continue to monitor.  We also discussed the importance of maintaining adequate caloric and protein intake.  We discussed that there are many options of protein shakes, as long as there is a high protein content the brand and flavor is not as important while we try to maintain and then increase his weight.  Orders: -     Megestrol Acetate; Take 5 mLs (625 mg total) by mouth daily.  Dispense: 150 mL; Refill: 0    Return in about 4 weeks (around 11/19/2022) for follow-up for weight loss, labs (nonfasting) 1 week prior.    Melida Quitter, PA

## 2022-11-06 ENCOUNTER — Other Ambulatory Visit: Payer: Self-pay

## 2022-11-06 DIAGNOSIS — I10 Essential (primary) hypertension: Secondary | ICD-10-CM

## 2022-11-06 DIAGNOSIS — E039 Hypothyroidism, unspecified: Secondary | ICD-10-CM

## 2022-11-06 DIAGNOSIS — E785 Hyperlipidemia, unspecified: Secondary | ICD-10-CM

## 2022-11-12 ENCOUNTER — Other Ambulatory Visit: Payer: Medicare HMO

## 2022-11-12 DIAGNOSIS — I10 Essential (primary) hypertension: Secondary | ICD-10-CM | POA: Diagnosis not present

## 2022-11-12 DIAGNOSIS — E785 Hyperlipidemia, unspecified: Secondary | ICD-10-CM

## 2022-11-12 DIAGNOSIS — E039 Hypothyroidism, unspecified: Secondary | ICD-10-CM | POA: Diagnosis not present
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
## 2022-11-12 NOTE — Patient Instructions (Signed)
SURGICAL WAITING ROOM VISITATION Patients having surgery or a procedure may have no more than 2 support people in the waiting area - these visitors may rotate in the visitor waiting room.   Due to an increase in RSV and influenza rates and associated hospitalizations, children ages 80 and under may not visit patients in [facility] hospitals. If the patient needs to stay at the hospital during part of their recovery, the visitor guidelines for inpatient rooms apply.  PRE-OP VISITATION  Pre-op nurse will coordinate an appropriate time for 1 support person to accompany the patient in pre-op.  This support person may not rotate.  This visitor will be contacted when the time is appropriate for the visitor to come back in the pre-op area.  Please refer to the [facility] website for the visitor guidelines for Inpatients (after your surgery is over and you are in a regular room).  You are not required to quarantine at this time prior to your surgery. However, you must do this: Hand Hygiene often Do NOT share personal items Notify your provider if you are in close contact with someone who has COVID or you develop fever 100.4 or greater, new onset of sneezing, cough, sore throat, shortness of breath or body aches.  If you test positive for Covid or have been in contact with anyone that has tested positive in the last 10 days please notify you surgeon.    Your procedure is scheduled on:  Tuesday   November 26, 2022  Report to [facility] Main Entrance: Comfort entrance where the [employer] is available.   Report to admitting at:  07:30   AM  Call this number if you have any questions or problems the morning of surgery [phone]  DO NOT EAT OR DRINK ANYTHING AFTER MIDNIGHT THE NIGHT PRIOR TO YOUR SURGERY / PROCEDURE.   FOLLOW BOWEL PREP AND ANY ADDITIONAL PRE OP INSTRUCTIONS YOU RECEIVED FROM YOUR SURGEON'S OFFICE!!!   Oral Hygiene is also important to reduce your risk of  infection.        Remember - BRUSH YOUR TEETH THE MORNING OF SURGERY WITH YOUR REGULAR TOOTHPASTE  Do NOT smoke after Midnight the night before surgery.  STOP TAKING all Vitamins, Herbs and supplements 1 week before your surgery.   Take ONLY these medicines the morning of surgery with A SIP OF WATER: Pantoprazole (Protonix), levothyroxine, Levetiracetam (Keppra).  You may take lorazepam (Ativan) or Percocet if needed.  You may use your Albuterol inhaler if needed.                     You may not have any metal on your body including jewelry, and body piercing  Do not wear  lotions, powders,  cologne, or deodorant   Men may shave face and neck.  Contacts, Hearing Aids, dentures or bridgework may not be worn into surgery. DENTURES WILL BE REMOVED PRIOR TO SURGERY PLEASE DO NOT APPLY "Poly grip" OR ADHESIVES!!!  You may bring a small overnight bag with you on the day of surgery, only pack items that are not valuable. [facility] IS NOT RESPONSIBLE   FOR VALUABLES THAT ARE LOST OR STOLEN.    Do not bring your home medications to the hospital EXCEPT BRING YOUR ALBUTEROL INHALER. . The Pharmacy will dispense medications listed on your medication list to you during your admission in the Hospital.  Please read over the following fact sheets you were given: IF YOU HAVE QUESTIONS ABOUT  YOUR PRE-OP INSTRUCTIONS, PLEASE CALL [phone].   [facility] [facility] - Preparing for Surgery Before surgery, you can play an important role.  Because skin is not sterile, your skin needs to be as free of germs as possible.  You can reduce the number of germs on your skin by washing with CHG (chlorahexidine gluconate) soap before surgery.  CHG is an antiseptic cleaner which kills germs and bonds with the skin to continue killing germs even after washing. Please DO NOT use if you have an allergy to CHG or antibacterial soaps.  If your skin becomes reddened/irritated stop using the CHG and inform your nurse when you  arrive at Short Stay. Do not shave (including legs and underarms) for at least 48 hours prior to the first CHG shower.  You may shave your face/neck.  Please follow these instructions carefully:  1.  Shower with CHG Soap the night before surgery and the  morning of surgery.  2.  If you choose to wash your hair, wash your hair first as usual with your normal  shampoo.  3.  After you shampoo, rinse your hair and body thoroughly to remove the shampoo.                             4.  Use CHG as you would any other liquid soap.  You can apply chg directly to the skin and wash.  Gently with a scrungie or clean washcloth.  5.  Apply the CHG Soap to your body ONLY FROM THE NECK DOWN.   Do not use on face/ open                           Wound or open sores. Avoid contact with eyes, ears mouth and genitals (private parts).                       Wash face,  Genitals (private parts) with your normal soap.             6.  Wash thoroughly, paying special attention to the area where your  surgery  will be performed.  7.  Thoroughly rinse your body with warm water from the neck down.  8.  DO NOT shower/wash with your normal soap after using and rinsing off the CHG Soap.            9.  Pat yourself dry with a clean towel.            10.  Wear clean pajamas.            11.  Place clean sheets on your bed the night of your first shower and do not  sleep with pets.  ON THE DAY OF SURGERY : Do not apply any lotions/deodorants the morning of surgery.  Please wear clean clothes to the hospital/surgery center.    FAILURE TO FOLLOW THESE INSTRUCTIONS MAY RESULT IN THE CANCELLATION OF YOUR SURGERY  PATIENT SIGNATURE_________________________________  NURSE SIGNATURE__________________________________  ________________________________________________________________________

## 2022-11-13 ENCOUNTER — Encounter (HOSPITAL_COMMUNITY)
Admission: RE | Admit: 2022-11-13 | Discharge: 2022-11-13 | Disposition: A | Discharge status: Home / Self Care | Payer: Medicare HMO | Source: Ambulatory Visit | Attending: Urology | Admitting: Urology

## 2022-11-13 ENCOUNTER — Encounter (HOSPITAL_COMMUNITY): Payer: Self-pay

## 2022-11-13 ENCOUNTER — Other Ambulatory Visit: Payer: Self-pay

## 2022-11-13 DIAGNOSIS — I10 Essential (primary) hypertension: Secondary | ICD-10-CM | POA: Insufficient documentation

## 2022-11-13 DIAGNOSIS — Z01818 Encounter for other preprocedural examination: Secondary | ICD-10-CM

## 2022-11-13 HISTORY — DX: Gastro-esophageal reflux disease without esophagitis: K21.9

## 2022-11-13 HISTORY — DX: Hypothyroidism, unspecified: E03.9

## 2022-11-13 HISTORY — DX: Unspecified osteoarthritis, unspecified site: M19.90

## 2022-11-13 HISTORY — DX: Essential (primary) hypertension: I10

## 2022-11-13 HISTORY — DX: Personal history of urinary calculi: Z87.442

## 2022-11-13 HISTORY — DX: Unspecified asthma, uncomplicated: J45.909

## 2022-11-13 HISTORY — DX: Unspecified lack of expected normal physiological development in childhood: R62.50

## 2022-11-13 LAB — CBC
HCT: 45.7 % (ref 39.0–52.0)
Hemoglobin: 14.3 g/dL (ref 13.0–17.0)
MCH: 28.1 pg (ref 26.0–34.0)
MCHC: 31.3 g/dL (ref 30.0–36.0)
MCV: 89.8 fL (ref 80.0–100.0)
Platelets: 158 10*3/uL (ref 150–400)
RBC: 5.09 MIL/uL (ref 4.22–5.81)
RDW: 13.2 % (ref 11.5–15.5)
WBC: 6.3 10*3/uL (ref 4.0–10.5)
nRBC: 0 % (ref 0.0–0.2)

## 2022-11-13 LAB — COMPREHENSIVE METABOLIC PANEL
ALT: 15 IU/L (ref 0–44)
AST: 22 IU/L (ref 0–40)
Albumin: 4.3 g/dL (ref 3.8–4.9)
Alkaline Phosphatase: 76 IU/L (ref 44–121)
BUN/Creatinine Ratio: 9 (ref 9–20)
BUN: 10 mg/dL (ref 6–24)
Bilirubin Total: 0.5 mg/dL (ref 0.0–1.2)
CO2: 26 mmol/L (ref 20–29)
Calcium: 9.7 mg/dL (ref 8.7–10.2)
Chloride: 104 mmol/L (ref 96–106)
Creatinine, Ser: 1.08 mg/dL (ref 0.76–1.27)
Globulin, Total: 2.6 g/dL (ref 1.5–4.5)
Glucose: 85 mg/dL (ref 70–99)
Potassium: 4.2 mmol/L (ref 3.5–5.2)
Sodium: 144 mmol/L (ref 134–144)
Total Protein: 6.9 g/dL (ref 6.0–8.5)
eGFR: 80 mL/min/{1.73_m2} (ref >59)

## 2022-11-13 LAB — CBC WITH DIFFERENTIAL/PLATELET
Basophils Absolute: 0 10*3/uL (ref 0.0–0.2)
Basos: 1 %
EOS (ABSOLUTE): 0.1 10*3/uL (ref 0.0–0.4)
Eos: 2 %
Hematocrit: 48 % (ref 37.5–51.0)
Hemoglobin: 15 g/dL (ref 13.0–17.7)
Immature Grans (Abs): 0 10*3/uL (ref 0.0–0.1)
Immature Granulocytes: 0 %
Lymphocytes Absolute: 1 10*3/uL (ref 0.7–3.1)
Lymphs: 19 %
MCH: 28.4 pg (ref 26.6–33.0)
MCHC: 31.3 g/dL — ABNORMAL LOW (ref 31.5–35.7)
MCV: 91 fL (ref 79–97)
Monocytes Absolute: 0.3 10*3/uL (ref 0.1–0.9)
Monocytes: 6 %
Neutrophils Absolute: 3.9 10*3/uL (ref 1.4–7.0)
Neutrophils: 72 %
Platelets: 162 10*3/uL (ref 150–450)
RBC: 5.29 x10E6/uL (ref 4.14–5.80)
RDW: 13.1 % (ref 11.6–15.4)
WBC: 5.3 10*3/uL (ref 3.4–10.8)

## 2022-11-13 LAB — BASIC METABOLIC PANEL
Anion gap: 7 (ref 5–15)
BUN: 11 mg/dL (ref 6–20)
CO2: 27 mmol/L (ref 22–32)
Calcium: 8.9 mg/dL (ref 8.9–10.3)
Chloride: 105 mmol/L (ref 98–111)
Creatinine, Ser: 1 mg/dL (ref 0.61–1.24)
GFR, Estimated: >60 mL/min (ref >60)
Glucose, Bld: 96 mg/dL (ref 70–99)
Potassium: 4.4 mmol/L (ref 3.5–5.1)
Sodium: 139 mmol/L (ref 135–145)

## 2022-11-13 LAB — TSH: TSH: 1.53 u[IU]/mL (ref 0.450–4.500)

## 2022-11-13 LAB — TYPE AND SCREEN
ABO/RH(D): O NEG
Antibody Screen: NEGATIVE

## 2022-11-13 LAB — LIPID PANEL
Chol/HDL Ratio: 2.9 ratio (ref 0.0–5.0)
Cholesterol, Total: 112 mg/dL (ref 100–199)
HDL: 39 mg/dL — ABNORMAL LOW (ref >39)
LDL Chol Calc (NIH): 57 mg/dL (ref 0–99)
Triglycerides: 77 mg/dL (ref 0–149)
VLDL Cholesterol Cal: 16 mg/dL (ref 5–40)

## 2022-11-19 ENCOUNTER — Encounter: Payer: Self-pay | Admitting: Internal Medicine
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
## 2022-11-19 NOTE — Anesthesia Preprocedure Evaluation (Addendum)
Anesthesia Evaluation  Patient identified by MRN, date of birth, ID band Patient awake    Reviewed: Allergy & Precautions, H&P , NPO status , Patient's Chart, lab work & pertinent test results  Airway Mallampati: II  TM Distance: >3 FB Neck ROM: Full    Dental  (+) Teeth Intact, Dental Advisory Given   Pulmonary asthma    Pulmonary exam normal breath sounds clear to auscultation       Cardiovascular hypertension, Pt. on medications + Valvular Problems/Murmurs  Rhythm:Regular Rate:Normal  Echo 2019 - Left ventricle: The cavity size was normal. Wall thickness was increased in a pattern of moderate LVH. Systolic function was vigorous. The estimated ejection fraction was in the range of 65% to 70%. Wall motion was normal; there were no regional wall motion abnormalities. Features are consistent with a pseudonormal left ventricular filling pattern, with concomitant abnormal relaxation and increased filling pressure (grade 2 diastolic dysfunction).  - Aortic valve: Mildly calcified annulus. Trileaflet; normal thickness leaflets. Transvalvular velocity was within the normal range. There was no stenosis. There was no regurgitation.  - Mitral valve: There was trivial regurgitation.  - Left atrium: The atrium was normal in size.  - Right ventricle: The cavity size was normal. Wall thickness was    normal. Systolic function was normal.  - Right atrium: The atrium was mildly dilated.  - Atrial septum: There was increased thickness of the septum, consistent with lipomatous hypertrophy. No defect or patent foramen ovale was identified.  - Tricuspid valve: There was trivial regurgitation.  - Inferior vena cava: The vessel was normal in size. The    respirophasic diameter changes were in the normal range (>= 50%),   consistent with normal central venous pressure.  - Pericardium, extracardiac: There was no pericardial effusion.   Impressions:   - No  cardiac source of emboli was identified.      Neuro/Psych Seizures -, Well Controlled,  PSYCHIATRIC DISORDERS Anxiety Depression       GI/Hepatic Neg liver ROS,GERD  Medicated,,  Endo/Other  Hypothyroidism    Renal/GU Renal InsufficiencyRenal disease     Musculoskeletal  (+) Arthritis ,    Abdominal   Peds  Hematology negative hematology ROS (+)   Anesthesia Other Findings   Reproductive/Obstetrics                             Anesthesia Physical Anesthesia Plan  ASA: 3  Anesthesia Plan: General   Post-op Pain Management: Tylenol PO (pre-op)*   Induction: Intravenous  PONV Risk Score and Plan: 4 or greater and Treatment may vary due to age or medical condition, Ondansetron, Dexamethasone and Midazolam  Airway Management Planned: Oral ETT  Additional Equipment:   Intra-op Plan:   Post-operative Plan: Extubation in OR  Informed Consent: I have reviewed the patients History and Physical, chart, labs and discussed the procedure including the risks, benefits and alternatives for the proposed anesthesia with the patient or authorized representative who has indicated his/her understanding and acceptance.     Dental advisory given and Consent reviewed with POA  Plan Discussed with: CRNA  Anesthesia Plan Comments: (See PAT note from 9/11 by Sherlie Ban PA-C )        Anesthesia Quick Evaluation

## 2022-11-20 ENCOUNTER — Encounter: Payer: Self-pay | Admitting: Family Medicine

## 2022-11-20 ENCOUNTER — Ambulatory Visit (INDEPENDENT_AMBULATORY_CARE_PROVIDER_SITE_OTHER): Payer: Medicare HMO | Admitting: Family Medicine

## 2022-11-20 VITALS — BP 119/78 | HR 65 | Resp 18 | Ht 69.0 in | Wt 111.0 lb

## 2022-11-20 DIAGNOSIS — Z23 Encounter for immunization: Secondary | ICD-10-CM

## 2022-11-20 DIAGNOSIS — F411 Generalized anxiety disorder: Secondary | ICD-10-CM | POA: Diagnosis not present

## 2022-11-20 DIAGNOSIS — R627 Adult failure to thrive: Secondary | ICD-10-CM | POA: Diagnosis not present

## 2022-11-20 DIAGNOSIS — G40109 Localization-related (focal) (partial) symptomatic epilepsy and epileptic syndromes with simple partial seizures, not intractable, without status epilepticus: Secondary | ICD-10-CM

## 2022-11-20 MED ORDER — LORAZEPAM 0.5 MG PO TABS: 0.5000 mg | ORAL_TABLET | Freq: Every day | ORAL | 1 refills | Status: DC | PRN

## 2022-11-20 NOTE — Assessment & Plan Note (Addendum)
Has needed to use rescue Ativan more often the last month due to increased anxiety about upcoming surgery.  Sending refill today, PDMP reviewed.  Continue mirtazapine 30 mg nightly.  After surgery, may also consider adding anxiety medicine, he was previously on Prozac.

## 2022-11-20 NOTE — Progress Notes (Signed)
Established Patient Office Visit  Subjective   Patient ID: Damon Paluzzi., male    DOB: 1965/08/21  Age: 57 y.o. MRN: [id number]  Chief Complaint  Patient presents with   Weight Loss    HPI Damon Chapla. is a 57 y.o. male presenting today with his mother and father for follow up of weight loss.  At last appointment, discussed the importance of maintaining adequate caloric and protein intake.  At that time, discussed adding protein shakes to daily intake.  Also prescribed megestrol to increase appetite, but insurance would not cover it.  He has explained that he does not like to eat very much because it increases abdominal pain.  He hopes that after the surgery when the pain is gone his appetite will return to normal.  Outpatient Medications Prior to Visit  Medication Sig   Cholecalciferol (CVS VIT D 5000 HIGH-POTENCY PO) Take 5,000 Units by mouth daily.   levETIRAcetam (KEPPRA) 1000 MG tablet Take 1 tablet BID   levothyroxine (SYNTHROID) 50 MCG tablet Take 1 tablet (50 mcg total) by mouth daily.   megestrol (MEGACE ES) 625 MG/5ML suspension Take 5 mLs (625 mg total) by mouth daily.   mirtazapine (REMERON) 30 MG tablet Take 1 tablet (30 mg total) by mouth at bedtime. May increase to 1.5 tablets (45 mg total) if needed after 2 weeks.   Multiple Vitamin (MULTIVITAMIN WITH MINERALS) TABS tablet Take 1 tablet by mouth daily.   oxyCODONE-acetaminophen (PERCOCET/ROXICET) 5-325 MG tablet Take 1 tablet by mouth every 6 (six) hours as needed for severe pain.   pantoprazole (PROTONIX) 40 MG tablet TAKE 1 TABLET(40 MG) BY MOUTH TWICE DAILY   rosuvastatin (CRESTOR) 20 MG tablet TAKE 1 TABLET BY MOUTH DAILY   [DISCONTINUED] LORazepam (ATIVAN) 0.5 MG tablet Take 1 tablet (0.5 mg total) by mouth daily as needed for anxiety.   albuterol (VENTOLIN HFA) 108 (90 Base) MCG/ACT inhaler Inhale 2 puffs into the lungs every 8 (eight) hours as needed for wheezing or shortness of breath.   No  facility-administered medications prior to visit.    ROS Negative unless otherwise noted in HPI   Objective:     BP 119/78 (BP Location: Left Arm, Patient Position: Sitting, Cuff Size: Normal)   Pulse 65   Resp 18   Ht 5\' 9"  (1.753 m)   Wt 111 lb (50.3 kg)   SpO2 96%   BMI 16.39 kg/m   Physical Exam Vitals reviewed.  Constitutional:      General: He is not in acute distress.    Appearance: Normal appearance. He is not ill-appearing.  HENT:     Head: Normocephalic and atraumatic.     Nose: Nose normal.  Eyes:     Conjunctiva/sclera: Conjunctivae normal.  Pulmonary:     Effort: Pulmonary effort is normal.  Musculoskeletal:     Cervical back: Normal range of motion.  Skin:    Coloration: Skin is not jaundiced or pale.     Findings: No bruising.  Neurological:     Mental Status: He is alert and oriented to person, place, and time.  Psychiatric:        Mood and Affect: Mood normal.     Assessment & Plan:  FTT (failure to thrive) in adult Assessment & Plan: Appetite remains low, although weight has increased 1 pound over the last 4 weeks.  Continue mirtazapine 30 mg daily at bedtime.  It is possible that with abdominal pain alleviated after surgery, this could  be enough to increase appetite enough to encourage weight gain.  He endorses anxiety about the procedure and also likely has generalized anxiety overall.  After his surgery, it would be beneficial to start a daily anxiety medication if he is agreeable to it.  Will continue to monitor.  We discussed again the importance of maintaining high caloric and protein intake in order to increase weight.  Since he does not like protein shakes, I provided a list of high-protein foods to incorporate into his diet.   Generalized anxiety disorder Assessment & Plan: Has needed to use rescue Ativan more often the last month due to increased anxiety about upcoming surgery.  Sending refill today, PDMP reviewed.  Continue mirtazapine 30  mg nightly.  After surgery, may also consider adding anxiety medicine, he was previously on Prozac.  Orders: -     LORazepam; Take 1 tablet (0.5 mg total) by mouth daily as needed for anxiety.  Dispense: 30 tablet; Refill: 1  Localization-related epilepsy [facility]) Assessment & Plan: Followed by neurology annually in November.  Continue Keppra 1000 mg twice daily.   Need for influenza vaccination -     Flu vaccine trivalent PF, 6mos and older(Flulaval,Afluria,Fluarix,Fluzone)    Return in about 4 weeks (around 12/18/2022) for follow-up for weight loss.    Melida Quitter, PA

## 2022-11-20 NOTE — Assessment & Plan Note (Signed)
Followed by neurology annually in November.  Continue Keppra 1000 mg twice daily.

## 2022-11-20 NOTE — Patient Instructions (Signed)
Continue taking mirtazapine each night.  I hope that after the surgery is done, the belly pain will have stopped which will allow you to eat more.  Continue increasing your calories and especially your protein that you eat each day. I have included a list of high protein foods.  Keep working at eating more, I am hopeful that your weight will continue to go up!

## 2022-11-20 NOTE — Assessment & Plan Note (Signed)
Appetite remains low, although weight has increased 1 pound over the last 4 weeks.  Continue mirtazapine 30 mg daily at bedtime.  It is possible that with abdominal pain alleviated after surgery, this could be enough to increase appetite enough to encourage weight gain.  He endorses anxiety about the procedure and also likely has generalized anxiety overall.  After his surgery, it would be beneficial to start a daily anxiety medication if he is agreeable to it.  Will continue to monitor.  We discussed again the importance of maintaining high caloric and protein intake in order to increase weight.  Since he does not like protein shakes, I provided a list of high-protein foods to incorporate into his diet.

## 2022-11-25 ENCOUNTER — Other Ambulatory Visit: Payer: Self-pay | Admitting: Radiology

## 2022-11-25 DIAGNOSIS — N200 Calculus of kidney: Secondary | ICD-10-CM
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
## 2022-11-26 ENCOUNTER — Encounter (HOSPITAL_COMMUNITY): Payer: Self-pay | Admitting: Urology

## 2022-11-26 ENCOUNTER — Encounter (HOSPITAL_COMMUNITY)
Admission: RE | Disposition: A | Discharge status: Home / Self Care | Payer: Self-pay | Source: Ambulatory Visit | Attending: Urology

## 2022-11-26 ENCOUNTER — Observation Stay (HOSPITAL_COMMUNITY)
Admission: RE | Admit: 2022-11-26 | Discharge: 2022-11-27 | Disposition: A | Discharge status: Home / Self Care | Payer: Medicare HMO | Source: Ambulatory Visit | Attending: Urology | Admitting: Urology

## 2022-11-26 ENCOUNTER — Ambulatory Visit (HOSPITAL_COMMUNITY): Payer: Medicare HMO | Admitting: Medical

## 2022-11-26 ENCOUNTER — Ambulatory Visit (HOSPITAL_COMMUNITY): Payer: Medicare HMO

## 2022-11-26 ENCOUNTER — Other Ambulatory Visit: Payer: Self-pay

## 2022-11-26 ENCOUNTER — Ambulatory Visit (HOSPITAL_COMMUNITY)
Admission: RE | Admit: 2022-11-26 | Discharge: 2022-11-26 | Disposition: A | Discharge status: Home / Self Care | Payer: Medicare HMO | Source: Ambulatory Visit | Attending: Urology

## 2022-11-26 ENCOUNTER — Encounter (HOSPITAL_COMMUNITY): Payer: Self-pay

## 2022-11-26 ENCOUNTER — Ambulatory Visit (HOSPITAL_COMMUNITY): Payer: Self-pay | Admitting: Certified Registered"

## 2022-11-26 ENCOUNTER — Ambulatory Visit (HOSPITAL_COMMUNITY)
Admission: RE | Admit: 2022-11-26 | Discharge: 2022-11-26 | Disposition: A | Discharge status: Home / Self Care | Payer: Medicare HMO | Source: Ambulatory Visit | Attending: Urology | Admitting: Urology

## 2022-11-26 DIAGNOSIS — E039 Hypothyroidism, unspecified: Secondary | ICD-10-CM | POA: Insufficient documentation

## 2022-11-26 DIAGNOSIS — Z79899 Other long term (current) drug therapy: Secondary | ICD-10-CM | POA: Diagnosis not present

## 2022-11-26 DIAGNOSIS — N200 Calculus of kidney: Secondary | ICD-10-CM

## 2022-11-26 DIAGNOSIS — I10 Essential (primary) hypertension: Secondary | ICD-10-CM | POA: Insufficient documentation

## 2022-11-26 DIAGNOSIS — E785 Hyperlipidemia, unspecified: Secondary | ICD-10-CM | POA: Diagnosis not present

## 2022-11-26 DIAGNOSIS — I341 Nonrheumatic mitral (valve) prolapse: Secondary | ICD-10-CM | POA: Diagnosis not present

## 2022-11-26 DIAGNOSIS — J45909 Unspecified asthma, uncomplicated: Secondary | ICD-10-CM

## 2022-11-26 HISTORY — PX: IR URETERAL STENT RIGHT NEW ACCESS W/O SEP NEPHROSTOMY CATH: IMG6076

## 2022-11-26 HISTORY — DX: Calculus of kidney: N20.0

## 2022-11-26 HISTORY — PX: NEPHROLITHOTOMY: SHX5134

## 2022-11-26 LAB — CBC WITH DIFFERENTIAL/PLATELET
Abs Immature Granulocytes: 0.02 10*3/uL (ref 0.00–0.07)
Basophils Absolute: 0 10*3/uL (ref 0.0–0.1)
Basophils Relative: 1 %
Eosinophils Absolute: 0.1 10*3/uL (ref 0.0–0.5)
Eosinophils Relative: 1 %
HCT: 44.9 % (ref 39.0–52.0)
Hemoglobin: 14.4 g/dL (ref 13.0–17.0)
Immature Granulocytes: 0 %
Lymphocytes Relative: 16 %
Lymphs Abs: 0.9 10*3/uL (ref 0.7–4.0)
MCH: 28.6 pg (ref 26.0–34.0)
MCHC: 32.1 g/dL (ref 30.0–36.0)
MCV: 89.1 fL (ref 80.0–100.0)
Monocytes Absolute: 0.4 10*3/uL (ref 0.1–1.0)
Monocytes Relative: 7 %
Neutro Abs: 4.1 10*3/uL (ref 1.7–7.7)
Neutrophils Relative %: 75 %
Platelets: 134 10*3/uL — ABNORMAL LOW (ref 150–400)
RBC: 5.04 MIL/uL (ref 4.22–5.81)
RDW: 13.3 % (ref 11.5–15.5)
WBC: 5.5 10*3/uL (ref 4.0–10.5)
nRBC: 0 % (ref 0.0–0.2)

## 2022-11-26 LAB — BASIC METABOLIC PANEL
Anion gap: 7 (ref 5–15)
BUN: 11 mg/dL (ref 6–20)
CO2: 28 mmol/L (ref 22–32)
Calcium: 9.3 mg/dL (ref 8.9–10.3)
Chloride: 105 mmol/L (ref 98–111)
Creatinine, Ser: 1.05 mg/dL (ref 0.61–1.24)
GFR, Estimated: >60 mL/min (ref >60)
Glucose, Bld: 92 mg/dL (ref 70–99)
Potassium: 3.9 mmol/L (ref 3.5–5.1)
Sodium: 140 mmol/L (ref 135–145)

## 2022-11-26 LAB — PROTIME-INR
INR: 1.1 (ref 0.8–1.2)
Prothrombin Time: 14.1 seconds (ref 11.4–15.2)

## 2022-11-26 SURGERY — NEPHROLITHOTOMY PERCUTANEOUS
Anesthesia: General | Laterality: Right

## 2022-11-26 MED ORDER — ONDANSETRON HCL 4 MG/2ML IJ SOLN
INTRAMUSCULAR | Status: AC
  Filled 2022-11-26: qty 2

## 2022-11-26 MED ORDER — CIPROFLOXACIN IN D5W 400 MG/200ML IV SOLN
INTRAVENOUS | Status: AC
  Filled 2022-11-26: qty 200

## 2022-11-26 MED ORDER — OXYCODONE HCL 5 MG PO TABS
5.0000 mg | ORAL_TABLET | ORAL | Status: DC | PRN
  Administered 2022-11-27 (×3): 5 mg via ORAL
  Filled 2022-11-26 (×3): qty 1

## 2022-11-26 MED ORDER — SODIUM CHLORIDE 0.9 % IV SOLN
2.0000 g | INTRAVENOUS | Status: DC
  Filled 2022-11-26: qty 20

## 2022-11-26 MED ORDER — SODIUM CHLORIDE 0.9 % IV SOLN: INTRAVENOUS | Status: DC

## 2022-11-26 MED ORDER — LIDOCAINE-EPINEPHRINE 1 %-1:100000 IJ SOLN
20.0000 mL | Freq: Once | INTRAMUSCULAR | Status: AC
  Administered 2022-11-26: 10 mL via INTRADERMAL

## 2022-11-26 MED ORDER — ALBUTEROL SULFATE HFA 108 (90 BASE) MCG/ACT IN AERS: 2.0000 | INHALATION_SPRAY | Freq: Three times a day (TID) | RESPIRATORY_TRACT | Status: DC | PRN

## 2022-11-26 MED ORDER — DROPERIDOL 2.5 MG/ML IJ SOLN: 0.6250 mg | Freq: Once | INTRAMUSCULAR | Status: DC | PRN

## 2022-11-26 MED ORDER — FENTANYL CITRATE (PF) 100 MCG/2ML IJ SOLN
INTRAMUSCULAR | Status: AC | PRN
Start: 2022-11-26 — End: 2022-11-26
  Administered 2022-11-26: 50 ug via INTRAVENOUS

## 2022-11-26 MED ORDER — ACETAMINOPHEN 325 MG PO TABS
650.0000 mg | ORAL_TABLET | ORAL | Status: DC | PRN
  Administered 2022-11-27 (×2): 650 mg via ORAL
  Filled 2022-11-26 (×2): qty 2

## 2022-11-26 MED ORDER — LEVOTHYROXINE SODIUM 50 MCG PO TABS
50.0000 ug | ORAL_TABLET | Freq: Every day | ORAL | Status: DC
  Administered 2022-11-27: 50 ug via ORAL
  Filled 2022-11-26: qty 1

## 2022-11-26 MED ORDER — ZOLPIDEM TARTRATE 5 MG PO TABS: 5.0000 mg | ORAL_TABLET | Freq: Every evening | ORAL | Status: DC | PRN

## 2022-11-26 MED ORDER — CHLORHEXIDINE GLUCONATE 0.12 % MT SOLN
15.0000 mL | Freq: Once | OROMUCOSAL | Status: AC
  Administered 2022-11-26: 15 mL via OROMUCOSAL

## 2022-11-26 MED ORDER — MIDAZOLAM HCL 2 MG/2ML IJ SOLN
INTRAMUSCULAR | Status: AC | PRN
  Administered 2022-11-26: 1 mg via INTRAVENOUS

## 2022-11-26 MED ORDER — SODIUM CHLORIDE 0.9% FLUSH: 3.0000 mL | Freq: Two times a day (BID) | INTRAVENOUS | Status: DC

## 2022-11-26 MED ORDER — CIPROFLOXACIN IN D5W 400 MG/200ML IV SOLN
INTRAVENOUS | Status: DC | PRN
Start: 2022-11-26 — End: 2022-11-26
  Administered 2022-11-26: 400 mg via INTRAVENOUS

## 2022-11-26 MED ORDER — NITROFURANTOIN MONOHYD MACRO 100 MG PO CAPS: 100.0000 mg | ORAL_CAPSULE | Freq: Every day | ORAL | 0 refills | Status: DC

## 2022-11-26 MED ORDER — NALOXONE HCL 0.4 MG/ML IJ SOLN
INTRAMUSCULAR | Status: DC | PRN
Start: 2022-11-26 — End: 2022-11-26
  Administered 2022-11-26: .08 mg via INTRAVENOUS

## 2022-11-26 MED ORDER — ROCURONIUM BROMIDE 10 MG/ML (PF) SYRINGE
PREFILLED_SYRINGE | INTRAVENOUS | Status: AC
  Filled 2022-11-26: qty 10

## 2022-11-26 MED ORDER — OXYCODONE HCL 5 MG/5ML PO SOLN: 5.0000 mg | Freq: Once | ORAL | Status: DC | PRN

## 2022-11-26 MED ORDER — DEXAMETHASONE SODIUM PHOSPHATE 10 MG/ML IJ SOLN
INTRAMUSCULAR | Status: DC | PRN
  Administered 2022-11-26: 4 mg via INTRAVENOUS

## 2022-11-26 MED ORDER — PROPOFOL 10 MG/ML IV BOLUS
INTRAVENOUS | Status: DC | PRN
Start: 2022-11-26 — End: 2022-11-26
  Administered 2022-11-26: 100 mg via INTRAVENOUS

## 2022-11-26 MED ORDER — LIDOCAINE HCL (PF) 2 % IJ SOLN
INTRAMUSCULAR | Status: AC
  Filled 2022-11-26: qty 5

## 2022-11-26 MED ORDER — SODIUM CHLORIDE 0.9 % IV SOLN: 250.0000 mL | INTRAVENOUS | Status: DC | PRN

## 2022-11-26 MED ORDER — LEVETIRACETAM 500 MG PO TABS
1000.0000 mg | ORAL_TABLET | Freq: Two times a day (BID) | ORAL | Status: DC
  Administered 2022-11-26 – 2022-11-27 (×2): 1000 mg via ORAL
  Filled 2022-11-26 (×2): qty 2

## 2022-11-26 MED ORDER — PROMETHAZINE HCL 25 MG/ML IJ SOLN: 6.2500 mg | INTRAMUSCULAR | Status: DC | PRN

## 2022-11-26 MED ORDER — IOHEXOL 300 MG/ML  SOLN
50.0000 mL | Freq: Once | INTRAMUSCULAR | Status: AC | PRN
  Administered 2022-11-26: 50 mL

## 2022-11-26 MED ORDER — ORAL CARE MOUTH RINSE: 15.0000 mL | Freq: Once | OROMUCOSAL | Status: AC

## 2022-11-26 MED ORDER — PROPOFOL 10 MG/ML IV BOLUS
INTRAVENOUS | Status: AC
  Filled 2022-11-26: qty 20

## 2022-11-26 MED ORDER — SODIUM CHLORIDE 0.9% FLUSH: 3.0000 mL | INTRAVENOUS | Status: DC | PRN

## 2022-11-26 MED ORDER — DEXAMETHASONE SODIUM PHOSPHATE 10 MG/ML IJ SOLN
INTRAMUSCULAR | Status: AC
  Filled 2022-11-26: qty 1

## 2022-11-26 MED ORDER — ACETAMINOPHEN 500 MG PO TABS
1000.0000 mg | ORAL_TABLET | Freq: Once | ORAL | Status: AC
  Administered 2022-11-26: 1000 mg via ORAL
  Filled 2022-11-26: qty 2

## 2022-11-26 MED ORDER — FENTANYL CITRATE (PF) 100 MCG/2ML IJ SOLN
INTRAMUSCULAR | Status: DC | PRN
  Administered 2022-11-26 (×2): 50 ug via INTRAVENOUS

## 2022-11-26 MED ORDER — MIDAZOLAM HCL 2 MG/2ML IJ SOLN
INTRAMUSCULAR | Status: AC
  Filled 2022-11-26: qty 4

## 2022-11-26 MED ORDER — FENTANYL CITRATE PF 50 MCG/ML IJ SOSY: 25.0000 ug | PREFILLED_SYRINGE | INTRAMUSCULAR | Status: DC | PRN

## 2022-11-26 MED ORDER — NALOXONE HCL 0.4 MG/ML IJ SOLN
INTRAMUSCULAR | Status: AC
  Filled 2022-11-26: qty 1

## 2022-11-26 MED ORDER — ROCURONIUM BROMIDE 10 MG/ML (PF) SYRINGE
PREFILLED_SYRINGE | INTRAVENOUS | Status: DC | PRN
  Administered 2022-11-26: 50 mg via INTRAVENOUS
  Administered 2022-11-26 (×3): 10 mg via INTRAVENOUS

## 2022-11-26 MED ORDER — EPHEDRINE SULFATE-NACL 50-0.9 MG/10ML-% IV SOSY
PREFILLED_SYRINGE | INTRAVENOUS | Status: DC | PRN
  Administered 2022-11-26: 5 mg via INTRAVENOUS

## 2022-11-26 MED ORDER — SODIUM CHLORIDE 0.9 % IR SOLN
Status: DC | PRN
  Administered 2022-11-26: 6000 mL
  Administered 2022-11-26: 3000 mL

## 2022-11-26 MED ORDER — SODIUM CHLORIDE 0.9 % IV SOLN
2.0000 g | Freq: Once | INTRAVENOUS | Status: AC
  Administered 2022-11-26: 2 g via INTRAVENOUS

## 2022-11-26 MED ORDER — ENSURE ENLIVE PO LIQD: 237.0000 mL | Freq: Two times a day (BID) | ORAL | Status: DC

## 2022-11-26 MED ORDER — LIDOCAINE 2% (20 MG/ML) 5 ML SYRINGE
INTRAMUSCULAR | Status: DC | PRN
  Administered 2022-11-26: 40 mg via INTRAVENOUS

## 2022-11-26 MED ORDER — IOHEXOL 300 MG/ML  SOLN
INTRAMUSCULAR | Status: DC | PRN
  Administered 2022-11-26: 30 mL

## 2022-11-26 MED ORDER — LIDOCAINE-EPINEPHRINE 1 %-1:100000 IJ SOLN
INTRAMUSCULAR | Status: AC
  Filled 2022-11-26: qty 1

## 2022-11-26 MED ORDER — STERILE WATER FOR IRRIGATION IR SOLN
Status: DC | PRN
  Administered 2022-11-26: 1000 mL

## 2022-11-26 MED ORDER — ROSUVASTATIN CALCIUM 20 MG PO TABS
20.0000 mg | ORAL_TABLET | Freq: Every day | ORAL | Status: DC
  Administered 2022-11-26 – 2022-11-27 (×2): 20 mg via ORAL
  Filled 2022-11-26 (×2): qty 1

## 2022-11-26 MED ORDER — IOHEXOL 300 MG/ML  SOLN
50.0000 mL | Freq: Once | INTRAMUSCULAR | Status: AC | PRN
  Administered 2022-11-26: 50 mL via INTRAVENOUS

## 2022-11-26 MED ORDER — ALBUTEROL SULFATE (2.5 MG/3ML) 0.083% IN NEBU: 2.5000 mg | INHALATION_SOLUTION | Freq: Three times a day (TID) | RESPIRATORY_TRACT | Status: DC | PRN

## 2022-11-26 MED ORDER — DOCUSATE SODIUM 100 MG PO CAPS
100.0000 mg | ORAL_CAPSULE | Freq: Two times a day (BID) | ORAL | Status: DC
  Administered 2022-11-26 – 2022-11-27 (×2): 100 mg via ORAL
  Filled 2022-11-26 (×2): qty 1

## 2022-11-26 MED ORDER — PANTOPRAZOLE SODIUM 40 MG PO TBEC
40.0000 mg | DELAYED_RELEASE_TABLET | Freq: Every day | ORAL | Status: DC
  Administered 2022-11-26 – 2022-11-27 (×2): 40 mg via ORAL
  Filled 2022-11-26 (×2): qty 1

## 2022-11-26 MED ORDER — OXYCODONE HCL 5 MG PO TABS: 5.0000 mg | ORAL_TABLET | Freq: Once | ORAL | Status: DC | PRN

## 2022-11-26 MED ORDER — FENTANYL CITRATE (PF) 100 MCG/2ML IJ SOLN
INTRAMUSCULAR | Status: AC
  Filled 2022-11-26: qty 2

## 2022-11-26 MED ORDER — MIDAZOLAM HCL 2 MG/2ML IJ SOLN
INTRAMUSCULAR | Status: AC | PRN
Start: 2022-11-26 — End: 2022-11-26
  Administered 2022-11-26: 1 mg via INTRAVENOUS

## 2022-11-26 MED ORDER — HYDROCODONE-ACETAMINOPHEN 5-325 MG PO TABS: 1.0000 | ORAL_TABLET | Freq: Four times a day (QID) | ORAL | 0 refills | Status: DC | PRN

## 2022-11-26 MED ORDER — OXYBUTYNIN CHLORIDE 5 MG PO TABS: 5.0000 mg | ORAL_TABLET | Freq: Three times a day (TID) | ORAL | Status: DC | PRN

## 2022-11-26 MED ORDER — SODIUM CHLORIDE 0.9 % IV SOLN
INTRAVENOUS | Status: AC
  Filled 2022-11-26: qty 20

## 2022-11-26 MED ORDER — FENTANYL CITRATE (PF) 100 MCG/2ML IJ SOLN
INTRAMUSCULAR | Status: AC
  Filled 2022-11-26: qty 4

## 2022-11-26 MED ORDER — LACTATED RINGERS IV SOLN: INTRAVENOUS | Status: DC

## 2022-11-26 SURGICAL SUPPLY — 60 items
APL PRP STRL LF DISP 70% ISPRP (MISCELLANEOUS) ×1
APL SKNCLS STERI-STRIP NONHPOA (GAUZE/BANDAGES/DRESSINGS) ×1
BAG COUNTER SPONGE SURGICOUNT (BAG) IMPLANT
BAG DRN RND TRDRP ANRFLXCHMBR (UROLOGICAL SUPPLIES) ×1
BAG SPNG CNTER NS LX DISP (BAG)
BAG URINE DRAIN 2000ML AR STRL (UROLOGICAL SUPPLIES) IMPLANT
BASKET STONE NITINOL 3FRX115MB (UROLOGICAL SUPPLIES) IMPLANT
BASKET ZERO TIP NITINOL 2.4FR (BASKET) IMPLANT
BENZOIN TINCTURE PRP APPL 2/3 (GAUZE/BANDAGES/DRESSINGS) ×1 IMPLANT
BSKT STON RTRVL ZERO TP 2.4FR (BASKET)
CATH FOLEY 2W COUNCIL 20FR 5CC (CATHETERS) IMPLANT
CATH FOLEY 2WAY SLVR 5CC 18FR (CATHETERS) ×1 IMPLANT
CATH ROBINSON RED A/P 20FR (CATHETERS) IMPLANT
CATH URETERAL DUAL LUMEN 10F (MISCELLANEOUS) IMPLANT
CATH URETL OPEN END 6FR 70 (CATHETERS) IMPLANT
CATH X-FORCE N30 NEPHROSTOMY (TUBING) ×1 IMPLANT
CHLORAPREP W/TINT 26 (MISCELLANEOUS) ×1 IMPLANT
COVER SURGICAL LIGHT HANDLE (MISCELLANEOUS) IMPLANT
DRAPE C-ARM 42X120 X-RAY (DRAPES) ×1 IMPLANT
DRAPE LINGEMAN PERC (DRAPES) ×1 IMPLANT
DRAPE SURG IRRIG POUCH 19X23 (DRAPES) ×1 IMPLANT
DRSG TEGADERM 8X12 (GAUZE/BANDAGES/DRESSINGS) ×2 IMPLANT
GAUZE PAD ABD 8X10 STRL (GAUZE/BANDAGES/DRESSINGS) ×2 IMPLANT
GAUZE SPONGE 4X4 12PLY STRL (GAUZE/BANDAGES/DRESSINGS) ×1 IMPLANT
GLOVE SURG LX STRL 7.5 STRW (GLOVE) ×1 IMPLANT
GOWN STRL REUS W/ TWL XL LVL3 (GOWN DISPOSABLE) ×1 IMPLANT
GOWN STRL REUS W/TWL XL LVL3 (GOWN DISPOSABLE) ×1
GUIDEWIRE AMPLAZ .035X145 (WIRE) ×2 IMPLANT
GUIDEWIRE ANG ZIPWIRE 038X150 (WIRE) ×1 IMPLANT
KIT BASIN OR (CUSTOM PROCEDURE TRAY) ×1 IMPLANT
KIT PROBE 340X3.4XDISP GRN (MISCELLANEOUS) IMPLANT
KIT PROBE TRILOGY 3.4X340 (MISCELLANEOUS)
KIT PROBE TRILOGY 3.9X350 (MISCELLANEOUS) IMPLANT
KIT TURNOVER KIT A (KITS) IMPLANT
LASER FIB FLEXIVA PULSE ID 365 (Laser) IMPLANT
LASER FIB FLEXIVA PULSE ID 550 (Laser) IMPLANT
LASER FIB FLEXIVA PULSE ID 910 (Laser) IMPLANT
MANIFOLD NEPTUNE II (INSTRUMENTS) ×1 IMPLANT
NDL TROCAR 18X15 ECHO (NEEDLE) ×1 IMPLANT
NEEDLE TROCAR 18X15 ECHO (NEEDLE) ×1
NS IRRIG 1000ML POUR BTL (IV SOLUTION) ×1 IMPLANT
PACK CYSTO (CUSTOM PROCEDURE TRAY) ×1 IMPLANT
SHEATH DILATOR SET 8/10 (MISCELLANEOUS) IMPLANT
SHEATH PEELAWAY SET 9 (SHEATH) IMPLANT
SPONGE T-LAP 4X18 ~~LOC~~+RFID (SPONGE) ×1 IMPLANT
STENT ENDOURETEROTOMY 7-14 26C (STENTS) IMPLANT
STENT URET 6FRX26 CONTOUR (STENTS) IMPLANT
SUT ETHILON 3 0 PS 1 (SUTURE) IMPLANT
SUT SILK 2 0 30 PSL (SUTURE) ×1 IMPLANT
SYR 10ML LL (SYRINGE) ×1 IMPLANT
SYR 20ML LL LF (SYRINGE) ×2 IMPLANT
TOWEL OR 17X26 10 PK STRL BLUE (TOWEL DISPOSABLE) ×1 IMPLANT
TOWEL OR NON WOVEN STRL DISP B (DISPOSABLE) ×1 IMPLANT
TRACTIP FLEXIVA PULS ID 200XHI (Laser) IMPLANT
TRACTIP FLEXIVA PULSE ID 200 (Laser)
TRAY FOLEY MTR SLVR 16FR STAT (SET/KITS/TRAYS/PACK) ×1 IMPLANT
TUBING CONNECTING 10 (TUBING) ×2 IMPLANT
TUBING STONE CATCHER TRILOGY (MISCELLANEOUS) IMPLANT
TUBING UROLOGY SET (TUBING) ×1 IMPLANT
WATER STERILE IRR 1000ML POUR (IV SOLUTION) ×1 IMPLANT

## 2022-11-26 NOTE — Transfer of Care (Signed)
Immediate Anesthesia Transfer of Care Note  Patient: Damon [name].  Procedure(s) Performed: RIGHT NEPHROLITHOTOMY PERCUTANEOUS, NEPHROSTOMY TUBE PLACEMENT (Right)  Patient Location: PACU  Anesthesia Type:General  Level of Consciousness: awake  Airway & Oxygen Therapy: Patient Spontanous Breathing and Patient connected to face mask oxygen  Post-op Assessment: Report given to RN and Post -op Vital signs reviewed and stable  Post vital signs: Reviewed and stable  Last Vitals:  Vitals Value Taken Time  BP    Temp    Pulse 80 11/26/22 1541  Resp 13 11/26/22 1541  SpO2 100 % 11/26/22 1541  Vitals shown include unfiled device data.  Last Pain: There were no vitals filed for this visit.       Complications: No notable events documented.

## 2022-11-26 NOTE — Op Note (Signed)
Preoperative diagnosis: Right renal stone Postoperative diagnosis: Same  Procedure: Right PCNL greater than 2.5 cm  Surgeon: Mena Goes  Assistant: Showalter  Anesthesia: General  Indication for procedure: Damon [name] is a 57 year old male with a large symptomatic right renal pelvic stone.  Findings: Stone fragmented and completely evacuated.  Antegrade nephrostogram-this outlined the collecting system without dilation or extravasation.  Contrast freely flowed down the ureter to the bladder.  Description of procedure: After consent was obtained patient brought to the operating room.  After adequate anesthesia the external genitalia were prepped and draped in the usual sterile fashion and a Foley catheter was placed.  He was then carefully placed prone and all pressure points padded.  The right flank dressing was removed from the [facility] torque catheter.  The catheter and flank were then prepped and draped in the usual sterile fashion.  A Super Stiff wire was then advanced down the nephroureteral catheter into the bladder and the nephroureteral catheter was removed.  The skin was incised.  The dual-lumen exchange catheter was passed and a second Super Stiff wire was advanced down and coiled in the bladder.  We then passed the balloon dilator to the stone.  The balloon was inflated to about 25 atm.  No wasting was seen.  The 30 French sheath was then advanced to the stone and into the collecting system and that the balloon deflated and backed out.  The nephroscope was then advanced with the trilogy wand and a small clot was evacuated and we had direct access to the renal pelvis and the stone.  Patient had favorable anatomy with virtually all the lower and mid pole calyces opening up into a capacious renal pelvis.  The stone was not very dense.  The trilogy wand was used to fragment and evacuate the stone.  The UPJ was noted with 2 wires down.  We then swapped that out for the flexible cystoscope and inspected all  the calyces and saw no other stones.  We looked down the UPJ and saw no significant stone fragments.  We then backloaded the Super Stiff on the nephroscope tube pass a ureteral stent with the stent would not slide well over the Super Stiff.  Therefore we removed one of the Super Stiff send used a torque catheter through the access sheath to steer zip wire down into the bladder.  We then backloaded the zip wire on the nephroscope and a 6x26 cm stent was advanced with a good coil seen in the bladder and a good coil visually in the kidney when the wire was removed.  Over the [facility] we passed a nephrostomy tube (20 [place] council tip catheter) and injection of contrast was performed.  I filled the balloon to 3 cc.  It was seated well in the renal pelvis.  We then remove the wire and shot another antegrade nephrostogram and then carefully backed the sheath out and removed it.  The nephrostomy tube was secured to the skin with a 2-0 nylon.  I then closed the skin defect with a horizontal mattress 2-0 Vicryl.  The nephrostomy tube was irrigated and noted to be clear.  The drapes were removed and the patient cleaned up.  The nephrostomy was left to gravity drainage and a dressing placed.  He was then placed supine, awakened and taken to the cover room in stable condition.  Complications: None  Blood loss: 50 mL  Specimens to office: Stone fragments  Drains: 6 x 26 cm right ureteral stent  Disposition: Patient stable  to PACU

## 2022-11-26 NOTE — Procedures (Signed)
Vascular and Interventional Radiology Procedure Note  Patient: Damon [name]. DOB: November 26, 1965 Medical Record Number: [id number] Note Date/Time: 11/26/22 9:45 AM   Performing Physician: Roanna Banning, MD Assistant(s): None  Diagnosis: R Renal calculus. Planned OR for PCNL  Procedure:  RIGHT NEPHROURETERAL ACCESS RIGHT ANTEROGRADE NEPHROSTOGRAM  Anesthesia: Conscious Sedation Complications: None Estimated Blood Loss:  0 mL Specimens:  None  Findings:  Successful placement of a 5 F nephroureteral tube into the right kidney(s), with tip within the urinary bladder.  Plan: to OR for PCNL.  See detailed procedure note with images in PACS. The patient tolerated the procedure well without incident or complication and was returned to Recovery in stable condition.    Roanna Banning, MD [facility]ascular [facility] Interventional Radiology [facility] Radiology   Pager. [phone] Clinic. [phone]
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
## 2022-11-26 NOTE — Anesthesia Postprocedure Evaluation (Signed)
Anesthesia Post Note  Patient: Damon [name].  Procedure(s) Performed: RIGHT NEPHROLITHOTOMY PERCUTANEOUS, NEPHROSTOMY TUBE PLACEMENT (Right)     Patient location during evaluation: PACU Anesthesia Type: General Level of consciousness: sedated and patient cooperative Pain management: pain level controlled Vital Signs Assessment: post-procedure vital signs reviewed and stable Respiratory status: spontaneous breathing Cardiovascular status: stable Anesthetic complications: no   No notable events documented.  Last Vitals:  Vitals:   11/26/22 1545 11/26/22 1600  BP: (!) 163/92 (!) 168/90  Pulse: 78 64  Resp: 15 14  Temp:    SpO2: 100% 100%    Last Pain: There were no vitals filed for this visit.               Lewie Loron

## 2022-11-27 ENCOUNTER — Encounter (HOSPITAL_COMMUNITY): Payer: Self-pay | Admitting: Urology

## 2022-11-27 DIAGNOSIS — I341 Nonrheumatic mitral (valve) prolapse: Secondary | ICD-10-CM | POA: Diagnosis not present

## 2022-11-27 DIAGNOSIS — I10 Essential (primary) hypertension: Secondary | ICD-10-CM | POA: Diagnosis not present

## 2022-11-27 DIAGNOSIS — E039 Hypothyroidism, unspecified: Secondary | ICD-10-CM | POA: Diagnosis not present

## 2022-11-27 DIAGNOSIS — J45909 Unspecified asthma, uncomplicated: Secondary | ICD-10-CM | POA: Diagnosis not present

## 2022-11-27 DIAGNOSIS — Z79899 Other long term (current) drug therapy: Secondary | ICD-10-CM | POA: Diagnosis not present

## 2022-11-27 DIAGNOSIS — N200 Calculus of kidney: Secondary | ICD-10-CM | POA: Diagnosis not present

## 2022-11-27 LAB — BASIC METABOLIC PANEL
Anion gap: 6 (ref 5–15)
BUN: 10 mg/dL (ref 6–20)
CO2: 26 mmol/L (ref 22–32)
Calcium: 8.5 mg/dL — ABNORMAL LOW (ref 8.9–10.3)
Chloride: 107 mmol/L (ref 98–111)
Creatinine, Ser: 0.96 mg/dL (ref 0.61–1.24)
GFR, Estimated: >60 mL/min (ref >60)
Glucose, Bld: 93 mg/dL (ref 70–99)
Potassium: 4.2 mmol/L (ref 3.5–5.1)
Sodium: 139 mmol/L (ref 135–145)

## 2022-11-27 LAB — HEMOGLOBIN AND HEMATOCRIT, BLOOD
HCT: 42.3 % (ref 39.0–52.0)
Hemoglobin: 13.3 g/dL (ref 13.0–17.0)

## 2022-11-27 MED ORDER — CHLORHEXIDINE GLUCONATE CLOTH 2 % EX PADS: 6.0000 | MEDICATED_PAD | Freq: Every day | CUTANEOUS | Status: DC

## 2022-11-27 NOTE — Plan of Care (Signed)

## 2022-11-27 NOTE — Progress Notes (Signed)
Patient and his mother given discharge, medication, and follow up instructions, verbalized understanding, IV removed, personal belongings with patient, mother to transport home

## 2022-11-27 NOTE — TOC Transition Note (Signed)
Transition of Care [facility]) - CM/SW Discharge Note   Patient Details  Name: Damon [name]. MRN: [id number] Date of Birth: 08-17-1965  [facility]) CM/SW Contact:  Lanier Clam, RN Phone Number: 11/27/2022, 4:02 PM   Clinical Narrative:  d/c home no needs.     Final next level of care: Home/Self Care Barriers to Discharge: No Barriers Identified   Patient Goals and CMS Choice      Discharge Placement                         Discharge Plan and Services Additional resources added to the After Visit Summary for                                       Social Determinants of Health (SDOH) Interventions SDOH Screenings   Food Insecurity: No Food Insecurity (11/26/2022)  Housing: Low Risk  (11/26/2022)  Transportation Needs: No Transportation Needs (11/26/2022)  Utilities: Not At Risk (11/26/2022)  Depression (PHQ2-9): Medium Risk (10/22/2022)  Financial Resource Strain: Low Risk  (02/27/2018)  Social Connections: Unknown (02/27/2018)  Tobacco Use: Low Risk  (11/26/2022)     Readmission Risk Interventions     No data to display

## 2022-11-28 NOTE — Discharge Summary (Signed)
Physician Discharge Summary  Patient ID: Damon [name]. MRN: [id number] DOB/AGE: [birth year] 56 y.o.  Admit date: 11/26/2022 Discharge date: 11/28/2022  Admission Diagnoses: Right renal stone  Discharge Diagnoses:  Principal Problem:   Kidney stone   Discharged Condition: good  Hospital Course: Damon [name] was admitted following right PCNL.  He did well.  Postop day 1 his labs were stable.  He was afebrile.  He was tolerating a regular diet.  His Foley catheter was removed and his nephrostomy tube was capped.  He voided later in the day without difficulty and the nephrostomy tube was removed.  He has an internal double-J stent.  He was ready for discharge.  Consults: None  Significant Diagnostic Studies: None  Treatments: surgery: 1.  Right nephroureteral stent placement by IR 2.  Right percutaneous nephrolithotomy  Discharge Exam: Blood pressure 115/70, pulse 67, temperature 98.8 F (37.1 C), temperature source Oral, resp. rate 14, height 5\' 8"  (1.727 m), weight 54.3 kg, SpO2 98%. No acute distress, alert and oriented Watching TV, eating breakfast Abdomen soft and nontender, right nephrostomy tube with grade 1-2 hematuria.  This was capped. No calf pain or swelling GU-Foley catheter in place-urine grade 1-2.  Disposition: Discharge disposition: 01-Home or Self Care        Allergies as of 11/27/2022       Reactions   Bactrim [sulfamethoxazole-trimethoprim] Rash        Medication List     STOP taking these medications    oxyCODONE-acetaminophen 5-325 MG tablet Commonly known as: PERCOCET/ROXICET       TAKE these medications    albuterol 108 (90 Base) MCG/ACT inhaler Commonly known as: VENTOLIN HFA Inhale 2 puffs into the lungs every 8 (eight) hours as needed for wheezing or shortness of breath.   CVS VIT D 5000 HIGH-POTENCY PO Take 5,000 Units by mouth daily.   HYDROcodone-acetaminophen 5-325 MG tablet Commonly known as: NORCO/VICODIN Take 1 tablet by  mouth every 6 (six) hours as needed for moderate pain.   levETIRAcetam 1000 MG tablet Commonly known as: KEPPRA Take 1 tablet BID   levothyroxine 50 MCG tablet Commonly known as: SYNTHROID Take 1 tablet (50 mcg total) by mouth daily.   LORazepam 0.5 MG tablet Commonly known as: ATIVAN Take 1 tablet (0.5 mg total) by mouth daily as needed for anxiety.   megestrol 625 MG/5ML suspension Commonly known as: MEGACE ES Take 5 mLs (625 mg total) by mouth daily.   mirtazapine 30 MG tablet Commonly known as: REMERON Take 1 tablet (30 mg total) by mouth at bedtime. May increase to 1.5 tablets (45 mg total) if needed after 2 weeks. What changed: additional instructions   multivitamin with minerals Tabs tablet Take 1 tablet by mouth daily.   nitrofurantoin (macrocrystal-monohydrate) 100 MG capsule Commonly known as: MACROBID Take 1 capsule (100 mg total) by mouth at bedtime.   pantoprazole 40 MG tablet Commonly known as: PROTONIX TAKE 1 TABLET(40 MG) BY MOUTH TWICE DAILY   rosuvastatin 20 MG tablet Commonly known as: CRESTOR TAKE 1 TABLET BY MOUTH DAILY        Follow-up Information     Jerilee Field, MD Follow up on 12/06/2022.   Specialty: Urology Why: at 11:30 AM with NP Elodia Florence information: [address] [place] [address] [phone]                 Signed: Jerilee Field 11/28/2022, 1:30 PM

## 2022-12-06 DIAGNOSIS — N200 Calculus of kidney: Secondary | ICD-10-CM | POA: Diagnosis not present

## 2022-12-12 ENCOUNTER — Observation Stay (HOSPITAL_COMMUNITY)
Admission: EM | Admit: 2022-12-12 | Discharge: 2022-12-13 | Disposition: A | Discharge status: Home / Self Care | Payer: Medicare HMO | Attending: Family Medicine | Admitting: Family Medicine

## 2022-12-12 ENCOUNTER — Other Ambulatory Visit: Payer: Self-pay

## 2022-12-12 ENCOUNTER — Encounter (HOSPITAL_COMMUNITY): Payer: Self-pay

## 2022-12-12 DIAGNOSIS — J45909 Unspecified asthma, uncomplicated: Secondary | ICD-10-CM | POA: Insufficient documentation

## 2022-12-12 DIAGNOSIS — G40909 Epilepsy, unspecified, not intractable, without status epilepticus: Secondary | ICD-10-CM

## 2022-12-12 DIAGNOSIS — K921 Melena: Secondary | ICD-10-CM | POA: Diagnosis not present

## 2022-12-12 DIAGNOSIS — E785 Hyperlipidemia, unspecified: Secondary | ICD-10-CM | POA: Diagnosis not present

## 2022-12-12 DIAGNOSIS — K922 Gastrointestinal hemorrhage, unspecified: Secondary | ICD-10-CM | POA: Diagnosis present

## 2022-12-12 DIAGNOSIS — F32A Depression, unspecified: Secondary | ICD-10-CM | POA: Insufficient documentation

## 2022-12-12 DIAGNOSIS — R569 Unspecified convulsions: Secondary | ICD-10-CM | POA: Insufficient documentation

## 2022-12-12 DIAGNOSIS — I10 Essential (primary) hypertension: Secondary | ICD-10-CM | POA: Diagnosis not present

## 2022-12-12 DIAGNOSIS — E039 Hypothyroidism, unspecified: Secondary | ICD-10-CM | POA: Diagnosis present

## 2022-12-12 DIAGNOSIS — K648 Other hemorrhoids: Secondary | ICD-10-CM | POA: Diagnosis not present

## 2022-12-12 DIAGNOSIS — Z79899 Other long term (current) drug therapy: Secondary | ICD-10-CM | POA: Insufficient documentation

## 2022-12-12 DIAGNOSIS — K219 Gastro-esophageal reflux disease without esophagitis: Secondary | ICD-10-CM | POA: Insufficient documentation

## 2022-12-12 DIAGNOSIS — K644 Residual hemorrhoidal skin tags: Secondary | ICD-10-CM

## 2022-12-12 DIAGNOSIS — K625 Hemorrhage of anus and rectum: Principal | ICD-10-CM | POA: Insufficient documentation

## 2022-12-12 DIAGNOSIS — F419 Anxiety disorder, unspecified: Secondary | ICD-10-CM | POA: Insufficient documentation

## 2022-12-12 LAB — CBC
HCT: 50.6 % (ref 39.0–52.0)
Hemoglobin: 15.8 g/dL (ref 13.0–17.0)
MCH: 28.6 pg (ref 26.0–34.0)
MCHC: 31.2 g/dL (ref 30.0–36.0)
MCV: 91.7 fL (ref 80.0–100.0)
Platelets: 239 10*3/uL (ref 150–400)
RBC: 5.52 MIL/uL (ref 4.22–5.81)
RDW: 13.4 % (ref 11.5–15.5)
WBC: 8.4 10*3/uL (ref 4.0–10.5)
nRBC: 0 % (ref 0.0–0.2)

## 2022-12-12 LAB — HEMOGLOBIN AND HEMATOCRIT, BLOOD
HCT: 43.4 % (ref 39.0–52.0)
HCT: 37.6 % — ABNORMAL LOW (ref 39.0–52.0)
Hemoglobin: 13.4 g/dL (ref 13.0–17.0)
Hemoglobin: 11.8 g/dL — ABNORMAL LOW (ref 13.0–17.0)

## 2022-12-12 LAB — COMPREHENSIVE METABOLIC PANEL
ALT: 44 U/L (ref 0–44)
AST: 51 U/L — ABNORMAL HIGH (ref 15–41)
Albumin: 4.2 g/dL (ref 3.5–5.0)
Alkaline Phosphatase: 69 U/L (ref 38–126)
Anion gap: 8 (ref 5–15)
BUN: 8 mg/dL (ref 6–20)
CO2: 25 mmol/L (ref 22–32)
Calcium: 9.2 mg/dL (ref 8.9–10.3)
Chloride: 108 mmol/L (ref 98–111)
Creatinine, Ser: 1.09 mg/dL (ref 0.61–1.24)
GFR, Estimated: >60 mL/min (ref >60)
Glucose, Bld: 105 mg/dL — ABNORMAL HIGH (ref 70–99)
Potassium: 3.9 mmol/L (ref 3.5–5.1)
Sodium: 141 mmol/L (ref 135–145)
Total Bilirubin: 0.5 mg/dL (ref 0.3–1.2)
Total Protein: 7.6 g/dL (ref 6.5–8.1)

## 2022-12-12 LAB — TYPE AND SCREEN
ABO/RH(D): O NEG
Antibody Screen: NEGATIVE

## 2022-12-12 LAB — HIV ANTIBODY (ROUTINE TESTING W REFLEX): HIV Screen 4th Generation wRfx: NONREACTIVE

## 2022-12-12 MED ORDER — TRAZODONE HCL 50 MG PO TABS: 25.0000 mg | ORAL_TABLET | Freq: Every evening | ORAL | Status: DC | PRN

## 2022-12-12 MED ORDER — ONDANSETRON HCL 4 MG/2ML IJ SOLN: 4.0000 mg | Freq: Four times a day (QID) | INTRAMUSCULAR | Status: DC | PRN

## 2022-12-12 MED ORDER — ACETAMINOPHEN 650 MG RE SUPP: 650.0000 mg | Freq: Four times a day (QID) | RECTAL | Status: DC | PRN

## 2022-12-12 MED ORDER — PANTOPRAZOLE SODIUM 40 MG PO TBEC: 40.0000 mg | DELAYED_RELEASE_TABLET | Freq: Two times a day (BID) | ORAL | Status: DC

## 2022-12-12 MED ORDER — ADULT MULTIVITAMIN W/MINERALS CH
1.0000 | ORAL_TABLET | Freq: Every day | ORAL | Status: DC
  Administered 2022-12-12 – 2022-12-13 (×2): 1 via ORAL
  Filled 2022-12-12 (×2): qty 1

## 2022-12-12 MED ORDER — LEVETIRACETAM 500 MG PO TABS
1000.0000 mg | ORAL_TABLET | Freq: Two times a day (BID) | ORAL | Status: DC
  Administered 2022-12-12 – 2022-12-13 (×2): 1000 mg via ORAL
  Filled 2022-12-12 (×2): qty 2

## 2022-12-12 MED ORDER — HYDROCORTISONE ACETATE 25 MG RE SUPP
25.0000 mg | Freq: Two times a day (BID) | RECTAL | Status: DC
  Administered 2022-12-12 – 2022-12-13 (×2): 25 mg via RECTAL
  Filled 2022-12-12 (×3): qty 1

## 2022-12-12 MED ORDER — HYDROCODONE-ACETAMINOPHEN 5-325 MG PO TABS: 1.0000 | ORAL_TABLET | Freq: Four times a day (QID) | ORAL | Status: DC | PRN

## 2022-12-12 MED ORDER — MIRTAZAPINE 15 MG PO TABS
30.0000 mg | ORAL_TABLET | Freq: Every day | ORAL | Status: DC
  Administered 2022-12-12: 30 mg via ORAL
  Filled 2022-12-12: qty 2

## 2022-12-12 MED ORDER — ROSUVASTATIN CALCIUM 10 MG PO TABS
20.0000 mg | ORAL_TABLET | Freq: Every day | ORAL | Status: DC
  Administered 2022-12-12 – 2022-12-13 (×2): 20 mg via ORAL
  Filled 2022-12-12 (×2): qty 2

## 2022-12-12 MED ORDER — LORAZEPAM 0.5 MG PO TABS: 0.5000 mg | ORAL_TABLET | Freq: Every day | ORAL | Status: DC | PRN

## 2022-12-12 MED ORDER — PANTOPRAZOLE SODIUM 40 MG IV SOLR
40.0000 mg | Freq: Two times a day (BID) | INTRAVENOUS | Status: DC
  Administered 2022-12-12 – 2022-12-13 (×2): 40 mg via INTRAVENOUS
  Filled 2022-12-12 (×2): qty 10

## 2022-12-12 MED ORDER — LEVOTHYROXINE SODIUM 50 MCG PO TABS
50.0000 ug | ORAL_TABLET | Freq: Every day | ORAL | Status: DC
  Administered 2022-12-13: 50 ug via ORAL
  Filled 2022-12-12: qty 1

## 2022-12-12 MED ORDER — DOCUSATE SODIUM 100 MG PO CAPS
100.0000 mg | ORAL_CAPSULE | Freq: Two times a day (BID) | ORAL | Status: DC
  Administered 2022-12-12 – 2022-12-13 (×2): 100 mg via ORAL
  Filled 2022-12-12 (×2): qty 1

## 2022-12-12 MED ORDER — ACETAMINOPHEN 325 MG PO TABS: 650.0000 mg | ORAL_TABLET | Freq: Four times a day (QID) | ORAL | Status: DC | PRN

## 2022-12-12 MED ORDER — NITROFURANTOIN MONOHYD MACRO 100 MG PO CAPS
100.0000 mg | ORAL_CAPSULE | Freq: Every day | ORAL | Status: DC
  Administered 2022-12-12: 100 mg via ORAL
  Filled 2022-12-12: qty 1

## 2022-12-12 MED ORDER — SODIUM CHLORIDE 0.9 % IV SOLN: INTRAVENOUS | Status: DC

## 2022-12-12 MED ORDER — HYDROCORTISONE (PERIANAL) 2.5 % EX CREA
TOPICAL_CREAM | Freq: Two times a day (BID) | CUTANEOUS | Status: DC
  Filled 2022-12-12: qty 28.35

## 2022-12-12 MED ORDER — ALBUTEROL SULFATE (2.5 MG/3ML) 0.083% IN NEBU: 3.0000 mL | INHALATION_SOLUTION | Freq: Three times a day (TID) | RESPIRATORY_TRACT | Status: DC | PRN

## 2022-12-12 MED ORDER — POLYETHYLENE GLYCOL 3350 17 G PO PACK
17.0000 g | PACK | Freq: Every day | ORAL | Status: DC
  Filled 2022-12-12 (×2): qty 1

## 2022-12-12 MED ORDER — ONDANSETRON HCL 4 MG PO TABS: 4.0000 mg | ORAL_TABLET | Freq: Four times a day (QID) | ORAL | Status: DC | PRN

## 2022-12-12 NOTE — Plan of Care (Signed)

## 2022-12-12 NOTE — Assessment & Plan Note (Signed)
-   We will continue Synthroid. 

## 2022-12-12 NOTE — ED Triage Notes (Signed)
Pt presenting today complaining of rectal bleeding. Pt has had several bloody bowel movements and endorses bright red blood present. Denies any pain N/V, or blood thinners. Pt did have a surgery last week to remove a kidney stone.

## 2022-12-12 NOTE — Assessment & Plan Note (Signed)
-   We will continue Keppra. 

## 2022-12-12 NOTE — Assessment & Plan Note (Signed)
-   We will continue PPI therapy 

## 2022-12-12 NOTE — ED Provider Notes (Addendum)
[place] EMERGENCY DEPARTMENT AT [facility] Provider Note   CSN: [id number] Arrival date & time: 12/12/22  4098     History  Chief Complaint  Patient presents with   Rectal Bleeding    Damon [name]. is a 57 y.o. male.  57 year old male presenting emergency department for rectal bleeding.  Had had large bowel bloody bowel movement earlier today with bright red blood.  Family at bedside who aided in history reported that it was a significant amount of blood.  No blood thinners, NSAIDs or BC Goody powder use.  Not having abdominal pain.  No nausea no vomiting.  No further bloody bowel movements.   Rectal Bleeding      Home Medications Prior to Admission medications   Medication Sig Start Date End Date Taking? Authorizing Provider  albuterol (VENTOLIN HFA) 108 (90 Base) MCG/ACT inhaler Inhale 2 puffs into the lungs every 8 (eight) hours as needed for wheezing or shortness of breath. 04/25/22 10/31/22  Saralyn Pilar A, PA  Cholecalciferol (CVS VIT D 5000 HIGH-POTENCY PO) Take 5,000 Units by mouth daily.    [provider]  HYDROcodone-acetaminophen (NORCO/VICODIN) 5-325 MG tablet Take 1 tablet by mouth every 6 (six) hours as needed for moderate pain. 11/26/22 11/26/23  Jerilee Field, MD  levETIRAcetam (KEPPRA) 1000 MG tablet Take 1 tablet BID 08/27/22   Drema Dallas, DO  levothyroxine (SYNTHROID) 50 MCG tablet Take 1 tablet (50 mcg total) by mouth daily. 09/12/22   Melida Quitter, PA  LORazepam (ATIVAN) 0.5 MG tablet Take 1 tablet (0.5 mg total) by mouth daily as needed for anxiety. 11/20/22   Melida Quitter, PA  megestrol (MEGACE ES) 625 MG/5ML suspension Take 5 mLs (625 mg total) by mouth daily. Patient not taking: Reported on 11/26/2022 10/22/22   Saralyn Pilar A, PA  mirtazapine (REMERON) 30 MG tablet Take 1 tablet (30 mg total) by mouth at bedtime. May increase to 1.5 tablets (45 mg total) if needed after 2 weeks. Patient taking differently:  Take 30 mg by mouth at bedtime. 09/23/22   Melida Quitter, PA  Multiple Vitamin (MULTIVITAMIN WITH MINERALS) TABS tablet Take 1 tablet by mouth daily.    [provider]  nitrofurantoin, macrocrystal-monohydrate, (MACROBID) 100 MG capsule Take 1 capsule (100 mg total) by mouth at bedtime. 11/26/22   Jerilee Field, MD  pantoprazole (PROTONIX) 40 MG tablet TAKE 1 TABLET(40 MG) BY MOUTH TWICE DAILY 12/22/20   Arnaldo Natal, NP  rosuvastatin (CRESTOR) 20 MG tablet TAKE 1 TABLET BY MOUTH DAILY 09/23/22   Melida Quitter, PA      Allergies    Bactrim [sulfamethoxazole-trimethoprim]    Review of Systems   Review of Systems  Gastrointestinal:  Positive for hematochezia.    Physical Exam Updated Vital Signs BP (!) 150/83 (BP Location: Left Arm)   Pulse 67   Temp 97.6 F (36.4 C)   Resp 16   Ht 5\' 8"  (1.727 m)   Wt 49.9 kg   SpO2 100%   BMI 16.73 kg/m  Physical Exam Vitals and nursing note reviewed.  HENT:     Head: Normocephalic.     Nose: Nose normal.     Mouth/Throat:     Mouth: Mucous membranes are moist.  Eyes:     Conjunctiva/sclera: Conjunctivae normal.  Cardiovascular:     Rate and Rhythm: Normal rate and regular rhythm.  Pulmonary:     Effort: Pulmonary effort is normal.     Breath  sounds: Normal breath sounds.  Abdominal:     General: Abdomen is flat. There is no distension.     Tenderness: There is no abdominal tenderness. There is no guarding or rebound.  Genitourinary:    Comments: Hemorrhoids noted.  Remove blood around anus.  Digital rectal exam with no overt mass.  Hemoccult positive. Musculoskeletal:        General: Normal range of motion.  Skin:    General: Skin is warm.     Capillary Refill: Capillary refill takes less than 2 seconds.  Neurological:     Mental Status: He is alert and oriented to person, place, and time.  Psychiatric:        Mood and Affect: Mood normal.        Behavior: Behavior normal.     ED Results /  Procedures / Treatments   Labs (all labs ordered are listed, but only abnormal results are displayed) Labs Reviewed  COMPREHENSIVE METABOLIC PANEL - Abnormal; Notable for the following components:      Result Value   Glucose, Bld 105 (*)    AST 51 (*)    All other components within normal limits  CBC  HEMOGLOBIN AND HEMATOCRIT, BLOOD  POC OCCULT BLOOD, ED  TYPE AND SCREEN    EKG None  Radiology No results found.  Procedures Procedures    Medications Ordered in ED Medications - No data to display  ED Course/ Medical Decision Making/ A&P Clinical Course as of 12/12/22 1531  Thu Dec 12, 2022  1128 Hemoglobin: 15.8 Improved compared to prior.  No anemia. [TY]  1128 Comprehensive metabolic panel(!) No metabolic derangements.  Normal kidney function.  No transaminitis that would suggest hepatobiliary disease. [TY]  1412 Hemoglobin: 13.4 Appears to have had approximately 2 point hemoglobin drop.  No further episodes of bleeding here in the emergency department however.  Stable vitals.  Will consult GI for their recommendations. [TY]    Clinical Course User Index [TY] Coral Spikes, DO                                 Medical Decision Making 57 year old male presenting emergency department with rectal bleeding.  He is afebrile nontachycardic minimally stable.  Physical exam with soft nontender abdomen.  Has some hemorrhoids, but do not appear to be overt source of bleeding.  No further bloody bowel movements here.  Initial hemoglobin was reassuring at 15.8.  However 4-hour repeat H&H with a drop to 13.4.  Case discussed with gastroenterology, PA and [place]. Who stated that he does have some friable colonic tissue and prior hemorrhoids.They would be happy to see him inpatient.  He has no significant metabolic derangements.  Normal kidney function.  Shared decision making with patient and wife who would feel more comfortable being observed.  Feel this is reasonable given his age  and risk factors with 2 pt hgb drop.   Amount and/or Complexity of Data Reviewed Independent Historian: spouse    Details: translation as patient is deaf External Data Reviewed:     Details: Last colonoscopy 2022 for melena Labs: ordered. Decision-making details documented in ED Course.  Risk Decision regarding hospitalization.         Final Clinical Impression(s) / ED Diagnoses Final diagnoses:  Rectal bleeding    Rx / DC Orders ED Discharge Orders     None         Coral Spikes, DO  12/12/22 1518    Coral Spikes, DO 12/12/22 1531

## 2022-12-12 NOTE — Assessment & Plan Note (Signed)
-   We will continue lorazepam and Remeron.
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
## 2022-12-12 NOTE — Assessment & Plan Note (Signed)
-   We will continue statin therapy. 
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
## 2022-12-12 NOTE — Assessment & Plan Note (Addendum)
-   The patient has been having bright red bleeding per rectum without melena but with heme positive stools - He will be admitted to a medical telemetry observation bed. - We will follow serial hemoglobins and hematocrits. - We will place him on IV Protonix as well. - NSAIDs will be held off. - GI consultation will be obtained. - [facility] was notified about the patient.
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
## 2022-12-13 ENCOUNTER — Telehealth: Payer: Self-pay | Admitting: *Deleted

## 2022-12-13 DIAGNOSIS — K922 Gastrointestinal hemorrhage, unspecified: Secondary | ICD-10-CM | POA: Diagnosis not present

## 2022-12-13 LAB — CBC
HCT: 45.2 % (ref 39.0–52.0)
Hemoglobin: 13.8 g/dL (ref 13.0–17.0)
MCH: 28.2 pg (ref 26.0–34.0)
MCHC: 30.5 g/dL (ref 30.0–36.0)
MCV: 92.2 fL (ref 80.0–100.0)
Platelets: 209 10*3/uL (ref 150–400)
RBC: 4.9 MIL/uL (ref 4.22–5.81)
RDW: 13.2 % (ref 11.5–15.5)
WBC: 5.8 10*3/uL (ref 4.0–10.5)
nRBC: 0 % (ref 0.0–0.2)

## 2022-12-13 LAB — BASIC METABOLIC PANEL
Anion gap: 11 (ref 5–15)
BUN: 9 mg/dL (ref 6–20)
CO2: 26 mmol/L (ref 22–32)
Calcium: 9.2 mg/dL (ref 8.9–10.3)
Chloride: 102 mmol/L (ref 98–111)
Creatinine, Ser: 1.04 mg/dL (ref 0.61–1.24)
GFR, Estimated: >60 mL/min (ref >60)
Glucose, Bld: 101 mg/dL — ABNORMAL HIGH (ref 70–99)
Potassium: 4 mmol/L (ref 3.5–5.1)
Sodium: 139 mmol/L (ref 135–145)

## 2022-12-13 LAB — HEMOGLOBIN AND HEMATOCRIT, BLOOD
HCT: 41.9 % (ref 39.0–52.0)
Hemoglobin: 12.9 g/dL — ABNORMAL LOW (ref 13.0–17.0)

## 2022-12-13 LAB — TSH: TSH: 0.603 u[IU]/mL (ref 0.350–4.500)

## 2022-12-13 MED ORDER — DOCUSATE SODIUM 100 MG PO CAPS: 100.0000 mg | ORAL_CAPSULE | Freq: Two times a day (BID) | ORAL | 0 refills | Status: DC

## 2022-12-13 MED ORDER — HYDROCORTISONE (PERIANAL) 2.5 % EX CREA: TOPICAL_CREAM | Freq: Two times a day (BID) | CUTANEOUS | 0 refills | Status: DC

## 2022-12-13 MED ORDER — POLYETHYLENE GLYCOL 3350 17 G PO PACK: 17.0000 g | PACK | Freq: Every day | ORAL | 0 refills | Status: DC

## 2022-12-13 MED ORDER — HYDROCORTISONE ACETATE 25 MG RE SUPP: 25.0000 mg | Freq: Two times a day (BID) | RECTAL | 0 refills | Status: DC

## 2022-12-13 NOTE — Progress Notes (Signed)
   12/13/22 1221  TOC Brief Assessment  Insurance and Status Reviewed  Patient has primary care physician Yes  Home environment has been reviewed Home w/ mother  Prior level of function: Independent/modified independent  Prior/Current Home Services No current home services  Social Determinants of Health Reivew SDOH reviewed no interventions necessary  Readmission risk has been reviewed Yes  Transition of care needs no transition of care needs at this time

## 2022-12-13 NOTE — Progress Notes (Addendum)
Progress Note  Primary GI: Dr. Marina Goodell  LOS: 0 days   Chief Complaint:Rectal bleeding   Subjective   [name] is present and translates (ASL). Reports one episode of bleeding this morning with a bowel movement. Less than it was yesterday so somewhat improving. Notes blood amount a 1/4 cup. Denies pain, nausea, vomiting. Tolerating diet without difficulty.   Objective   Vital signs in last 24 hours: Temp:  [97.3 F (36.3 C)-98.1 F (36.7 C)] 97.4 F (36.3 C) (10/11 0454) Pulse Rate:  [58-94] 64 (10/11 0454) Resp:  [16-18] 16 (10/10 1335) BP: (130-169)/(74-88) 139/81 (10/11 0454) SpO2:  [98 %-100 %] 100 % (10/11 0454) Weight:  [49.9 kg] 49.9 kg (10/10 0907) Last BM Date : 12/12/22 Last BM recorded by nurses in past 5 days Stool Type: Type 6 (Mushy consistency with ragged edges) (12/13/2022  6:07 AM)  General:   male in no acute distress Heart:  Regular rate and rhythm; no murmurs Pulm: Clear anteriorly; no wheezing Abdomen: soft, nondistended, normal bowel sounds in all quadrants. Nontender without guarding. No organomegaly appreciated. Extremities:  No edema Neurologic:  Alert and  oriented x4;  No focal deficits.  Psych:  Cooperative. Normal mood and affect.  Intake/Output from previous day: 10/10 0701 - 10/11 0700 In: 120 [P.O.:120] Out: -  Intake/Output this shift: No intake/output data recorded.  Studies/Results: No results found.  Lab Results: Recent Labs    12/12/22 0937 12/12/22 1335 12/12/22 2203 12/13/22 0614  WBC 8.4  --   --  5.8  HGB 15.8 13.4 11.8* 13.8  HCT 50.6 43.4 37.6* 45.2  PLT 239  --   --  209   BMET Recent Labs    12/12/22 0937 12/13/22 0614  NA 141 139  K 3.9 4.0  CL 108 102  CO2 25 26  GLUCOSE 105* 101*  BUN 8 9  CREATININE 1.09 1.04  CALCIUM 9.2 9.2   LFT Recent Labs    12/12/22 0937  PROT 7.6  ALBUMIN 4.2  AST 51*  ALT 44  ALKPHOS 69  BILITOT 0.5   PT/INR No results for input(s): "LABPROT", "INR" in  the last 72 hours.   Scheduled Meds:  docusate sodium  100 mg Oral BID   hydrocortisone   Topical BID   hydrocortisone  25 mg Rectal BID   levETIRAcetam  1,000 mg Oral BID   levothyroxine  50 mcg Oral Q0600   mirtazapine  30 mg Oral QHS   multivitamin with minerals  1 tablet Oral Daily   nitrofurantoin (macrocrystal-monohydrate)  100 mg Oral QHS   pantoprazole (PROTONIX) IV  40 mg Intravenous Q12H   polyethylene glycol  17 g Oral Daily   rosuvastatin  20 mg Oral Daily   Continuous Infusions:  sodium chloride 40 mL/hr at 12/12/22 1641     Impression/Plan   Hematochezia, likely hemorrhoidal Constipation Colonoscopy 2022 with internal hemorrhoids and nonspecific colonic mucosa friability with negative biopsies. Hgb 13.8, stable TSH normal BUN 9, Cr 1.04 One episode of bleeding, hgb stable, as long as he continues to control constipation suspect hemorrhoids will improve. Only occurs with bowel movements.    Impression/Plan   - no further GI workup planned - continue Anusol HC cream topically BID and Anusol HC supp PR bid - Colace BID and miralax every day - patient is agreeable to banding, will set up outpatient OV for banding  ADDENDUM Internal hemorrhoid banding appt with Dr. Leone Payor 11/14 at 3:10pm. Please see discharge.  Cheryel Kyte Leanna Sato  12/13/2022, 8:50 AM

## 2022-12-13 NOTE — Telephone Encounter (Signed)
Patient has been scheduled for hemorrhoidal banding appointment with Dr Leone Payor on 01/16/23 at 3:10 pm. Patient is currently admitted, so [facility] [address] PA-C says she will place this information with his discharge.

## 2022-12-13 NOTE — Discharge Summary (Signed)
Physician Discharge Summary   Patient: Damon [name]. MRN: [id number] DOB: 08/14/1965  Admit date:     12/12/2022  Discharge date: 12/13/22  Discharge Physician: Damon [name]   PCP: Melida Quitter, PA   Recommendations at discharge:  Follow up with PCP with repeat CBC in 1-2 weeks. Follow up with  GI for hemorrhoid banding. Discharged on bowel regimen.  Discharge Diagnoses: Active Problems:   GI bleed   Dyslipidemia   Hypothyroidism   Seizure disorder (HCC)   Anxiety and depression   GERD without esophagitis  Hospital Course: Damon [name]. is a 57 y.o. Caucasian male with medical history significant for anxiety, asthma, deafness since birth, hypothyroidism, osteoarthritis, GERD, hypertension and dyslipidemia as well as seizure disorder and mitral valve prolapse, who presented to the emergency room with a Damon [name] of bright red bleeding per rectum this morning and another episode here in the hospital.  His mother denied any melena or abdominal pain.  No reported nausea or vomiting.  No fever or chills.  No chest pain or palpitations.  No cough or wheezing or hemoptysis.  No dysuria, oliguria or hematuria or flank pain.  No other bleeding diathesis.   ED Course: Upon presentation to the emergency room, BP was 136/93 with otherwise normal vital signs.  Labs revealed unremarkable CMP except for AST 51.  CBC was within normal.  Hemoglobin hematocrit were 15.8 and 50.6 and later 13.4 and 43.44 hours later.  The patient was typed and crossmatch. EKG as reviewed by me : None Imaging: None.    gastroenterology was notified about the patient.  He will be admitted to an observation medical telemetry bed for further evaluation and management.  Assessment and Plan: Hematochezia: Only with bowel movements in setting of known hemorrhoid disease and ongoing constipation. Colonoscopy Jan 2022 by Dr. Lavon Paganini with internal hemorrhoids and nonspecific colonic mucosa friability  with negative biopsies. EGD at that time revealed LA grade B esophagitis, mod hiatal hernia, normal mucosa.  Hgb 13.8, BUN 9. Bleeding overall has improved and he is not anemic. GI recommends:  - no further GI workup planned - continue Anusol HC cream topically BID and Anusol HC supp PR bid - colace BID and miralax every day - patient is agreeable to banding, will set up outpatient OV for banding   Other medication comorbidities are stable.    Consultants: GI Procedures performed: None  Disposition: Home Diet recommendation:  Cardiac diet DISCHARGE MEDICATION: Allergies as of 12/13/2022       Reactions   Bactrim [sulfamethoxazole-trimethoprim] Rash        Medication List     STOP taking these medications    Advil 200 MG Caps Generic drug: Ibuprofen   HYDROcodone-acetaminophen 5-325 MG tablet Commonly known as: NORCO/VICODIN       TAKE these medications    albuterol 108 (90 Base) MCG/ACT inhaler Commonly known as: VENTOLIN HFA Inhale 2 puffs into the lungs every 8 (eight) hours as needed for wheezing or shortness of breath.   ciprofloxacin 250 MG tablet Commonly known as: CIPRO Take 250 mg by mouth 2 (two) times daily.   docusate sodium 100 MG capsule Commonly known as: COLACE Take 1 capsule (100 mg total) by mouth 2 (two) times daily.   hydrocortisone 2.5 % rectal cream Commonly known as: ANUSOL-HC Apply topically 2 (two) times daily.   hydrocortisone 25 MG suppository Commonly known as: ANUSOL-HC Place 1 suppository (25 mg total) rectally 2 (two) times daily.  levETIRAcetam 1000 MG tablet Commonly known as: KEPPRA Take 1 tablet BID What changed:  how much to take how to take this when to take this additional instructions   levothyroxine 50 MCG tablet Commonly known as: SYNTHROID Take 1 tablet (50 mcg total) by mouth daily. What changed: when to take this   LORazepam 0.5 MG tablet Commonly known as: ATIVAN Take 1 tablet (0.5 mg total) by  mouth daily as needed for anxiety.   megestrol 625 MG/5ML suspension Commonly known as: MEGACE ES Take 5 mLs (625 mg total) by mouth daily.   mirtazapine 30 MG tablet Commonly known as: REMERON Take 1 tablet (30 mg total) by mouth at bedtime. May increase to 1.5 tablets (45 mg total) if needed after 2 weeks. What changed: additional instructions   multivitamin with minerals Tabs tablet Take 1 tablet by mouth daily with breakfast.   pantoprazole 40 MG tablet Commonly known as: PROTONIX TAKE 1 TABLET(40 MG) BY MOUTH TWICE DAILY   polyethylene glycol 17 g packet Commonly known as: MIRALAX / GLYCOLAX Take 17 g by mouth daily. Start taking on: December 14, 2022   rosuvastatin 20 MG tablet Commonly known as: CRESTOR TAKE 1 TABLET BY MOUTH DAILY What changed:  how much to take how to take this when to take this additional instructions   Tums 500 MG chewable tablet Generic drug: calcium carbonate Chew 1-2 tablets by mouth 3 (three) times daily as needed for indigestion or heartburn.   Vitamin D-3 125 MCG (5000 UT) Tabs Take 5,000 Units by mouth daily.        Follow-up Information     Saralyn Pilar A, PA Follow up.   Specialty: Family Medicine Contact information: [address] [place] [address] [phone]         Iva Boop, MD Follow up on 01/16/2023.   Specialty: Gastroenterology Why: appt for internal hemorrhoid banding at 3:10PM. please arrive 10-15 minutes prior Contact information: [address] [place] [address] [phone]                Discharge Exam: Filed Weights   12/12/22 0907  Weight: 49.9 kg  BP (!) 146/81 (BP Location: Left Arm)   Pulse 66   Temp 97.8 F (36.6 C) (Oral)   Resp 20   Ht 5\' 8"  (1.727 m)   Wt 49.9 kg   SpO2 100%   BMI 16.73 kg/m   No distress, feels fine, wants to go home. Mother performs ASL interpretation at bedside. Clear, nonlabored RRR, no MRG Soft, NT, ND,  +BS  Condition at discharge: stable  The results of significant diagnostics from this hospitalization (including imaging, microbiology, ancillary and laboratory) are listed below for reference.   Imaging Studies: IR URETERAL STENT RIGHT NEW ACCESS W/O SEP NEPHROSTOMY CATH  Result Date: 11/26/2022 INDICATION: Renal stones, access for RIGHT percutaneous nephrolithotomy. EXAM: LEFT NEPHROURETERAL CATHETER FOR NEPHROLITHOTOMY ACCESS COMPARISON:  CT AP, 09/22/2022 and 07/22/2018. MEDICATIONS: Rocephin 2 gm IV; The antibiotic was administered in an appropriate time frame prior to skin puncture. ANESTHESIA/SEDATION: Moderate (conscious) sedation was employed during this procedure. A total of Versed 2 mg and Fentanyl 2 1 mcg was administered intravenously. Moderate Sedation Time: 27 minutes. The patient's level of consciousness and vital signs were monitored continuously by radiology nursing throughout the procedure under my direct supervision. CONTRAST:  50mL OMNIPAQUE IOHEXOL 300 MG/ML SOLN, 50mL OMNIPAQUE IOHEXOL 300 MG/ML SOLN - Administered into the renal collecting system. FLUOROSCOPY TIME:  Fluoroscopic dose; 2 mGy COMPLICATIONS: None immediate. PROCEDURE: Informed written consent was obtained from the patient and/or patient's representative after a discussion of the risks, benefits, and alternatives to treatment. The RIGHT flank region was prepped with Betadine in a sterile fashion, and a sterile drape was applied covering the operative field. A sterile gown and sterile gloves were used for the procedure. A timeout was performed prior to the initiation of the procedure. A pre procedural spot fluoroscopic image was obtained of the upper abdomen. Ultrasound scanning performed of the kidney was negative for significant hydronephrosis. As such, the stone within renal pelvis was targeted fluoroscopically with a 22 gauge Chiba needle. Access to the collecting system was confirmed with advancement of a Nitrex wire  into the collecting system. The needle was exchanged for the inner Fr catheter from an Accustick set and contrast injection confirmed access. A small amount of air was injected into the collecting system to help delineate a posterior calyx. A posterior inferior calyx was targeted with a 22 gauge Chiba needle. Access to the calyx was confirmed with advancement of a Nitrex wire into the collecting system. An Accustick set was utilized to dilate the tract and was subsequently exchanged for a Kumpe catheter over a Bentson wire. The Kumpe catheter was advanced down the ureter and into the urinary bladder. Postprocedural spot radiographs were obtained in various obliquities and the catheter was sutured to the skin. The catheter was capped and a dressing was placed. The patient tolerated the procedure well without immediate post procedural complication. FINDINGS: Pre procedural spot radiographic images demonstrates large RIGHT renal pelvic stone. Ultrasound scanning was negative for significant hydronephrosis and as such, the stone was targeted fluoroscopically allowing access to the collecting system. With the collecting system now opacified, a posterior inferior calyx was targeted fluoroscopically allowing placement of a Kumpe catheter through the calyx with tip advanced into the urinary bladder. IMPRESSION: Successful placement of a RIGHT 5 Fr nephroureteral catheter, with tip at the level of the urinary bladder for access during impending nephrolithotomy procedure. PLAN: To OR for percutaneous nephrolithotomy with urology, Dr Mena Goes. Roanna Banning, MD [facility] Electronically Signed   By: Roanna Banning M.D.   On: 11/26/2022 17:42   DG C-Arm 1-60 Min-No Report  Result Date: 11/26/2022 Fluoroscopy was utilized by the requesting physician.  No radiographic interpretation.   DG C-Arm 1-60 Min-No Report  Result Date: 11/26/2022 Fluoroscopy was utilized by  the requesting physician.  No radiographic interpretation.    Microbiology: Results for orders placed or performed in visit on 03/30/20  Novel Coronavirus, NAA ([facility])     Status: Abnormal   Collection Time: 03/30/20 10:44 AM   Specimen: Nasopharyngeal(NP) swabs in vial transport medium   Nasopharynge  Result Value Ref Range Status   SARS-CoV-2, NAA Detected (A) Not Detected Final    Comment: Patients who have a positive COVID-19 test result may now have treatment options. Treatment options are available for patients with mild to moderate symptoms and for hospitalized patients. Visit our website at [email or web address] for resources and information. This nucleic acid amplification test was developed and its performance characteristics determined by [employer]. Nucleic acid amplification tests include RT-PCR and TMA. This test has not been FDA cleared or approved. This test has been authorized by FDA under an Emergency Use Authorization (EUA). This test is only authorized for the duration of time the declaration that circumstances exist justifying the authorization of the emergency use of in vitro  diagnostic tests for detection of SARS-CoV-2 virus and/or diagnosis of COVID-19 infection under section 564(b)(1) of the Act, 21 U.S.C. 161WRU-0(A) (1), unless the authorization is terminated or revoked sooner. When diagnostic testing is negativ e, the possibility of a false negative result should be considered in the context of a patient's recent exposures and the presence of clinical signs and symptoms consistent with COVID-19. An individual without symptoms of COVID-19 and who is not shedding SARS-CoV-2 virus would expect to have a negative (not detected) result in this assay.   SARS-COV-2, NAA 2 DAY TAT     Status: None   Collection Time: 03/30/20 10:44 AM   Nasopharynge  Result Value Ref Range Status   SARS-CoV-2, NAA 2 DAY TAT Performed  Final     Labs: CBC: Recent Labs  Lab 12/12/22 0937 12/12/22 1335 12/12/22 2203 12/13/22 0614 12/13/22 0956  WBC 8.4  --   --  5.8  --   HGB 15.8 13.4 11.8* 13.8 12.9*  HCT 50.6 43.4 37.6* 45.2 41.9  MCV 91.7  --   --  92.2  --   PLT 239  --   --  209  --    Basic Metabolic Panel: Recent Labs  Lab 12/12/22 0937 12/13/22 0614  NA 141 139  K 3.9 4.0  CL 108 102  CO2 25 26  GLUCOSE 105* 101*  BUN 8 9  CREATININE 1.09 1.04  CALCIUM 9.2 9.2   Liver Function Tests: Recent Labs  Lab 12/12/22 0937  AST 51*  ALT 44  ALKPHOS 69  BILITOT 0.5  PROT 7.6  ALBUMIN 4.2   CBG: No results for input(s): "GLUCAP" in the last 168 hours.  Discharge time spent: greater than 30 minutes.  Signed: Tyrone Nine, MD [facility] 12/13/2022

## 2022-12-13 NOTE — Plan of Care (Signed)

## 2022-12-13 NOTE — Telephone Encounter (Signed)
===  View-only below this line=== ----- Message ----- From: Jearl Klinefelter Sent: 12/13/2022   8:51 AM EDT To: Richardson Chiquito, RN Subject: follow up                                      Can you please get a banding appt

## 2022-12-18 ENCOUNTER — Ambulatory Visit: Payer: Medicare HMO | Admitting: Family Medicine

## 2022-12-19 ENCOUNTER — Ambulatory Visit (INDEPENDENT_AMBULATORY_CARE_PROVIDER_SITE_OTHER): Payer: Medicare HMO | Admitting: Family Medicine

## 2022-12-19 ENCOUNTER — Encounter: Payer: Self-pay | Admitting: Family Medicine

## 2022-12-19 VITALS — BP 131/78 | HR 95 | Resp 18 | Ht 68.0 in | Wt 110.0 lb

## 2022-12-19 DIAGNOSIS — D649 Anemia, unspecified: Secondary | ICD-10-CM

## 2022-12-19 DIAGNOSIS — R627 Adult failure to thrive: Secondary | ICD-10-CM

## 2022-12-19 DIAGNOSIS — K219 Gastro-esophageal reflux disease without esophagitis: Secondary | ICD-10-CM | POA: Diagnosis not present

## 2022-12-19 DIAGNOSIS — F419 Anxiety disorder, unspecified: Secondary | ICD-10-CM

## 2022-12-19 DIAGNOSIS — F32A Depression, unspecified: Secondary | ICD-10-CM

## 2022-12-19 NOTE — Assessment & Plan Note (Signed)
Appetite remains low, but he has maintained weight over the last several weeks.  Continue mirtazapine 30 mg at bedtime.  Recommended different protein shake brands for him to try until he finds one that he likes.  Maintain high caloric and protein intake.  Optimistic that after hemorrhoid surgery and improved anxiety, he will be more willing to eat.  Will continue to monitor.

## 2022-12-19 NOTE — Assessment & Plan Note (Signed)
Agreeable to trial of Lexapro 10 mg daily.

## 2022-12-19 NOTE — Assessment & Plan Note (Signed)
Likely the cause of increased belching.  Recommended Tums twice daily for the next week.  If no improvement, resume PPI regimen if not currently following.

## 2022-12-19 NOTE — Patient Instructions (Addendum)
I would like to check your blood count again to make sure your hemoglobin is not too low. We can check your blood either tomorrow or Monday morning. Start escitalopram (Lexapro) to help with worrying Take 2 TUMS before your two largest meals of the day for 1 week. If this does not improve your burping, we will restart the Protonix. Add more protein into your day to help you to gain weight! The [employer] brand is creamy like whole milk and has a lot of protein. They have a few flavors including vanilla. You can find them at most grocery stores.

## 2022-12-19 NOTE — Progress Notes (Signed)
Established Patient Office Visit  Subjective   Patient ID: Damon Busbin., male    DOB: 21-Aug-1965  Age: 57 y.o. MRN: [id number]  Chief Complaint  Patient presents with   Failure To Thrive    HPI [facility]. is a 57 y.o. male presenting today for follow up of failure to thrive/weight loss. Due to language barrier, an ASL interpreter was present during the history-taking and subsequent discussion (and for part of the physical exam) with this patient.  Surgery for kidney stones went well and he is not having any abdominal pain.  However, worsening hemorrhoids and increase in rectal bleeding continues to make him hesitant to eat.  He also endorses feeling worried and scared about a lot of different things throughout the day.  He worries a lot about "what if" scenarios.  He also endorses increased belching particularly after eating.  Outpatient Medications Prior to Visit  Medication Sig   Cholecalciferol (VITAMIN D-3) 125 MCG (5000 UT) TABS Take 5,000 Units by mouth daily.   docusate sodium (COLACE) 100 MG capsule Take 1 capsule (100 mg total) by mouth 2 (two) times daily.   hydrocortisone (ANUSOL-HC) 2.5 % rectal cream Apply topically 2 (two) times daily.   hydrocortisone (ANUSOL-HC) 25 MG suppository Place 1 suppository (25 mg total) rectally 2 (two) times daily.   levETIRAcetam (KEPPRA) 1000 MG tablet Take 1 tablet BID (Patient taking differently: Take 1,000 mg by mouth 2 (two) times daily.)   levothyroxine (SYNTHROID) 50 MCG tablet Take 1 tablet (50 mcg total) by mouth daily. (Patient taking differently: Take 50 mcg by mouth daily before breakfast.)   LORazepam (ATIVAN) 0.5 MG tablet Take 1 tablet (0.5 mg total) by mouth daily as needed for anxiety.   mirtazapine (REMERON) 30 MG tablet Take 1 tablet (30 mg total) by mouth at bedtime. May increase to 1.5 tablets (45 mg total) if needed after 2 weeks. (Patient taking differently: Take 30 mg by mouth at bedtime.)   Multiple Vitamin  (MULTIVITAMIN WITH MINERALS) TABS tablet Take 1 tablet by mouth daily with breakfast.   polyethylene glycol (MIRALAX / GLYCOLAX) 17 g packet Take 17 g by mouth daily.   rosuvastatin (CRESTOR) 20 MG tablet TAKE 1 TABLET BY MOUTH DAILY (Patient taking differently: Take 20 mg by mouth at bedtime.)   TUMS 500 MG chewable tablet Chew 1-2 tablets by mouth 3 (three) times daily as needed for indigestion or heartburn.   albuterol (VENTOLIN HFA) 108 (90 Base) MCG/ACT inhaler Inhale 2 puffs into the lungs every 8 (eight) hours as needed for wheezing or shortness of breath.   [DISCONTINUED] megestrol (MEGACE ES) 625 MG/5ML suspension Take 5 mLs (625 mg total) by mouth daily. (Patient not taking: Reported on 12/12/2022)   [DISCONTINUED] pantoprazole (PROTONIX) 40 MG tablet TAKE 1 TABLET(40 MG) BY MOUTH TWICE DAILY (Patient not taking: Reported on 12/12/2022)   No facility-administered medications prior to visit.    ROS Negative unless otherwise noted in HPI   Objective:     BP 131/78 (BP Location: Left Arm, Patient Position: Sitting, Cuff Size: Normal)   Pulse 95   Resp 18   Ht 5\' 8"  (1.727 m)   Wt 110 lb (49.9 kg)   SpO2 99%   BMI 16.73 kg/m   Physical Exam Constitutional:      General: He is not in acute distress.    Appearance: Normal appearance.  HENT:     Head: Normocephalic and atraumatic.  Cardiovascular:  Rate and Rhythm: Normal rate and regular rhythm.     Heart sounds: Normal heart sounds. No murmur heard.    No friction rub. No gallop.  Pulmonary:     Effort: Pulmonary effort is normal. No respiratory distress.     Breath sounds: Normal breath sounds. No wheezing, rhonchi or rales.  Skin:    General: Skin is warm and dry.  Neurological:     Mental Status: He is alert and oriented to person, place, and time.  Psychiatric:        Mood and Affect: Mood normal.     Assessment & Plan:  FTT (failure to thrive) in adult Assessment & Plan: Appetite remains low, but he has  maintained weight over the last several weeks.  Continue mirtazapine 30 mg at bedtime.  Recommended different protein shake brands for him to try until he finds one that he likes.  Maintain high caloric and protein intake.  Optimistic that after hemorrhoid surgery and improved anxiety, he will be more willing to eat.  Will continue to monitor.   Anxiety and depression Assessment & Plan: Agreeable to trial of Lexapro 10 mg daily.   GERD without esophagitis Assessment & Plan: Likely the cause of increased belching.  Recommended Tums twice daily for the next week.  If no improvement, resume PPI regimen if not currently following.   Anemia, unspecified type -     CBC with Differential/Platelet; Future  Rechecking CBC for anemia noted about 1 week ago.  Return in about 7 weeks (around 02/06/2023) for follow-up for weight loss.    Melida Quitter, PA

## 2022-12-23 ENCOUNTER — Other Ambulatory Visit: Payer: Medicare HMO

## 2022-12-23 ENCOUNTER — Telehealth: Payer: Self-pay | Admitting: *Deleted

## 2022-12-23 MED ORDER — ESCITALOPRAM OXALATE 10 MG PO TABS: 10.0000 mg | ORAL_TABLET | Freq: Every day | ORAL | 2 refills | Status: DC

## 2022-12-23 NOTE — Telephone Encounter (Signed)
Rx sent  Meds ordered this encounter  Medications   escitalopram (LEXAPRO) 10 MG tablet    Sig: Take 1 tablet (10 mg total) by mouth daily.    Dispense:  30 tablet    Refill:  2    Order Specific Question:   Supervising Provider    Answer:   Sandre Kitty [[id number]]

## 2022-12-23 NOTE — Telephone Encounter (Signed)
Pt mother calling to say that provider was supposed to be sending in medication for pt to try to help him calm down she said she went to pharmacy and they did not have it.  Please advise.

## 2022-12-23 NOTE — Telephone Encounter (Signed)
Pt informed of below.  

## 2023-01-02 ENCOUNTER — Encounter (HOSPITAL_COMMUNITY): Payer: Self-pay | Admitting: Emergency Medicine

## 2023-01-02 ENCOUNTER — Ambulatory Visit (HOSPITAL_COMMUNITY)
Admission: EM | Admit: 2023-01-02 | Discharge: 2023-01-02 | Disposition: A | Discharge status: Home / Self Care | Payer: Medicare HMO

## 2023-01-02 ENCOUNTER — Other Ambulatory Visit: Payer: Self-pay

## 2023-01-02 ENCOUNTER — Emergency Department (HOSPITAL_COMMUNITY): Payer: Medicare HMO

## 2023-01-02 ENCOUNTER — Encounter (HOSPITAL_COMMUNITY): Payer: Self-pay

## 2023-01-02 ENCOUNTER — Emergency Department (HOSPITAL_COMMUNITY)
Admission: EM | Admit: 2023-01-02 | Discharge: 2023-01-02 | Disposition: A | Discharge status: Home / Self Care | Payer: Medicare HMO | Attending: Emergency Medicine | Admitting: Emergency Medicine

## 2023-01-02 DIAGNOSIS — F419 Anxiety disorder, unspecified: Secondary | ICD-10-CM | POA: Insufficient documentation

## 2023-01-02 DIAGNOSIS — R251 Tremor, unspecified: Secondary | ICD-10-CM

## 2023-01-02 DIAGNOSIS — E039 Hypothyroidism, unspecified: Secondary | ICD-10-CM | POA: Diagnosis not present

## 2023-01-02 DIAGNOSIS — J45909 Unspecified asthma, uncomplicated: Secondary | ICD-10-CM | POA: Insufficient documentation

## 2023-01-02 DIAGNOSIS — G319 Degenerative disease of nervous system, unspecified: Secondary | ICD-10-CM | POA: Diagnosis not present

## 2023-01-02 DIAGNOSIS — I10 Essential (primary) hypertension: Secondary | ICD-10-CM | POA: Insufficient documentation

## 2023-01-02 LAB — CBC WITH DIFFERENTIAL/PLATELET
Abs Immature Granulocytes: 0.02 10*3/uL (ref 0.00–0.07)
Basophils Absolute: 0 10*3/uL (ref 0.0–0.1)
Basophils Relative: 1 %
Eosinophils Absolute: 0 10*3/uL (ref 0.0–0.5)
Eosinophils Relative: 0 %
HCT: 42.8 % (ref 39.0–52.0)
Hemoglobin: 13.3 g/dL (ref 13.0–17.0)
Immature Granulocytes: 0 %
Lymphocytes Relative: 8 %
Lymphs Abs: 0.6 10*3/uL — ABNORMAL LOW (ref 0.7–4.0)
MCH: 27.9 pg (ref 26.0–34.0)
MCHC: 31.1 g/dL (ref 30.0–36.0)
MCV: 89.9 fL (ref 80.0–100.0)
Monocytes Absolute: 0.6 10*3/uL (ref 0.1–1.0)
Monocytes Relative: 8 %
Neutro Abs: 5.7 10*3/uL (ref 1.7–7.7)
Neutrophils Relative %: 83 %
Platelets: 130 10*3/uL — ABNORMAL LOW (ref 150–400)
RBC: 4.76 MIL/uL (ref 4.22–5.81)
RDW: 13.1 % (ref 11.5–15.5)
WBC: 7 10*3/uL (ref 4.0–10.5)
nRBC: 0 % (ref 0.0–0.2)

## 2023-01-02 LAB — BASIC METABOLIC PANEL
Anion gap: 5 (ref 5–15)
BUN: 6 mg/dL (ref 6–20)
CO2: 31 mmol/L (ref 22–32)
Calcium: 9.1 mg/dL (ref 8.9–10.3)
Chloride: 105 mmol/L (ref 98–111)
Creatinine, Ser: 1.04 mg/dL (ref 0.61–1.24)
GFR, Estimated: >60 mL/min (ref >60)
Glucose, Bld: 98 mg/dL (ref 70–99)
Potassium: 3.8 mmol/L (ref 3.5–5.1)
Sodium: 141 mmol/L (ref 135–145)

## 2023-01-02 LAB — TSH: TSH: 0.309 u[IU]/mL — ABNORMAL LOW (ref 0.350–4.500)

## 2023-01-02 NOTE — ED Provider Notes (Signed)
[place] EMERGENCY DEPARTMENT AT [facility] Provider Note   CSN: [id number] Arrival date & time: 01/02/23  1304     History  Chief Complaint  Patient presents with   anxiety    Damon [name]. is a 57 y.o. male with PMH anxiety, asthma, deafness since birth, hypothyroidism, osteoarthritis, GERD, hypertension and dyslipidemia as well as seizure disorder and mitral valve prolapse, recent admission 12/12/22 for rectal bleeding 2/2 hemorrhoids, constipation who presents from UC for tremor.  Patient was last seen at his baseline 2 nights ago.  Yesterday morning he woke up with a new tremor as well as numbness and tingling to the left upper extremity.  Tremor is present when patient tries to use the arm.  Since yesterday morning his gait has also been more wobbly, even while ambulating with a cane which he uses at baseline.  No falls.  Denies vision changes, difficulty swallowing, no facial droop, no weakness.  Has been taking his seizure medicines.  He initially presented to urgent care today and then was sent to the ED for further evaluation out of concern for possibly intracranial pathology or new neurologic process.  History was provided by patient's wife at bedside as well as the patient through his wife who was acting as a sign language interpreter.  I offered the patient a certified medical interpreter for sign language which he declined.      Home Medications Prior to Admission medications   Medication Sig Start Date End Date Taking? Authorizing Provider  albuterol (VENTOLIN HFA) 108 (90 Base) MCG/ACT inhaler Inhale 2 puffs into the lungs every 8 (eight) hours as needed for wheezing or shortness of breath. 04/25/22 12/12/22  Saralyn Pilar A, PA  Cholecalciferol (VITAMIN D-3) 125 MCG (5000 UT) TABS Take 5,000 Units by mouth daily.    [provider]  docusate sodium (COLACE) 100 MG capsule Take 1 capsule (100 mg total) by mouth 2 (two) times daily. 12/13/22    Tyrone Nine, MD  escitalopram (LEXAPRO) 10 MG tablet Take 1 tablet (10 mg total) by mouth daily. 12/23/22   Melida Quitter, PA  hydrocortisone (ANUSOL-HC) 2.5 % rectal cream Apply topically 2 (two) times daily. 12/13/22   Tyrone Nine, MD  hydrocortisone (ANUSOL-HC) 25 MG suppository Place 1 suppository (25 mg total) rectally 2 (two) times daily. 12/13/22   Tyrone Nine, MD  levETIRAcetam (KEPPRA) 1000 MG tablet Take 1 tablet BID Patient taking differently: Take 1,000 mg by mouth 2 (two) times daily. 08/27/22   Drema [place], DO  levothyroxine (SYNTHROID) 50 MCG tablet Take 1 tablet (50 mcg total) by mouth daily. Patient taking differently: Take 50 mcg by mouth daily before breakfast. 09/12/22   Saralyn Pilar A, PA  LORazepam (ATIVAN) 0.5 MG tablet Take 1 tablet (0.5 mg total) by mouth daily as needed for anxiety. 11/20/22   Melida Quitter, PA  mirtazapine (REMERON) 30 MG tablet Take 1 tablet (30 mg total) by mouth at bedtime. May increase to 1.5 tablets (45 mg total) if needed after 2 weeks. Patient taking differently: Take 30 mg by mouth at bedtime. 09/23/22   Melida Quitter, PA  Multiple Vitamin (MULTIVITAMIN WITH MINERALS) TABS tablet Take 1 tablet by mouth daily with breakfast.    [provider]  polyethylene glycol (MIRALAX / GLYCOLAX) 17 g packet Take 17 g by mouth daily. 12/14/22   Tyrone Nine, MD  rosuvastatin (CRESTOR) 20 MG tablet TAKE 1 TABLET BY MOUTH DAILY  Patient taking differently: Take 20 mg by mouth at bedtime. 09/23/22   Melida Quitter, PA  TUMS 500 MG chewable tablet Chew 1-2 tablets by mouth 3 (three) times daily as needed for indigestion or heartburn.    [provider]      Allergies    Bactrim [sulfamethoxazole-trimethoprim]    Review of Systems   Review of Systems as per HPI above  Physical Exam Updated Vital Signs BP (!) 145/97   Pulse 70   Temp 98.1 F (36.7 C) (Oral)   Resp 16   Ht 5\' 8"  (1.727 m)   Wt 49.9 kg   SpO2 100%    BMI 16.73 kg/m  Physical Exam Neurological:     General: No focal deficit present.     Mental Status: Mental status is at baseline.     Cranial Nerves: No cranial nerve deficit.     Comments: Rhythmic left upper extremity tremor with use of left upper extremity.  No tremor appreciated at rest. Normal strength and sensation in all muscle groups.  Cerebellar testing is normal in the bilateral lower extremities and inconclusive in the bilateral upper extremities (either has dysmetria in both upper extremities or difficulty due to underlying tremor in bilateral upper extremities).  Ambulation with a cane is mildly ataxic.      ED Results / Procedures / Treatments   Labs (all labs ordered are listed, but only abnormal results are displayed) Labs Reviewed  CBC WITH DIFFERENTIAL/PLATELET - Abnormal; Notable for the following components:      Result Value   Platelets 130 (*)    Lymphs Abs 0.6 (*)    All other components within normal limits  TSH - Abnormal; Notable for the following components:   TSH 0.309 (*)    All other components within normal limits  BASIC METABOLIC PANEL  T4, FREE    EKG None  Radiology MR BRAIN WO CONTRAST  Result Date: 01/02/2023 CLINICAL DATA:  Left arm shakiness EXAM: MRI HEAD WITHOUT CONTRAST TECHNIQUE: Multiplanar, multiecho pulse sequences of the brain and surrounding structures were obtained without intravenous contrast. COMPARISON:  02/26/2018 FINDINGS: Brain: Redemonstrated encephalomalacia involving the majority of the lateral left temporal lobe and inferolateral left frontal lobe. Moderately advanced cerebral and cerebellar atrophy, with prominence of the vermian atrophy again noted. No restricted diffusion to suggest acute or subacute infarct. No acute hemorrhage, mass, mass effect, or midline shift. No hydrocephalus or extra-axial collection. Unchanged size and configuration of the ventricles. Pituitary and craniocervical junction within normal limits.  Scattered T2 hyperintense signal in the periventricular white matter, likely the sequela of mild chronic small vessel ischemic disease. Vascular: Loss of the right vertebral artery flow void is again noted. Otherwise normal arterial flow voids. Skull and upper cervical spine: Normal marrow signal. Sinuses/Orbits: Clear paranasal sinuses. No acute finding in the orbits. Other: Fluid in the right mastoid air cells. IMPRESSION: 1. No acute intracranial process. No evidence of acute or subacute infarct. 2. Redemonstrated encephalomalacia involving the majority of the lateral left temporal lobe and inferolateral left frontal lobe. 3. Moderately advanced cerebral and cerebellar atrophy, with prominence of the vermian atrophy again noted. Electronically Signed   By: Wiliam Ke M.D.   On: 01/02/2023 18:33    Procedures Procedures    Medications Ordered in ED Medications - No data to display  ED Course/ Medical Decision Making/ A&P  Medical Decision Making Amount and/or Complexity of Data Reviewed External Data Reviewed: notes.    Details: Urgent care, discharge summary Labs: ordered. Decision-making details documented in ED Course. Radiology: ordered and independent interpretation performed. Decision-making details documented in ED Course.   57 year old male who presents as above with a new onset tremor versus dysmetria and unstable gait for greater than 24 hours.  Initial vitals are stable and he is afebrile.  Neuroexam is notable for possibly dysmetria versus tremor in upper extremities L > R and possibly ataxic gait.  Differential considered includes ischemic stroke, hemorrhagic stroke, brain mass, intention tremor, encephalitis/meningitis, thyroid derangement, hypoglycemia, parkinson's disease, drug side effect. Symptoms are concerning for stroke given the relatively sudden onset however he is outside of the window for any intervention; code stroke not called. Labs  including thyroid studies were obtained and are notable for normal glucose lytes CBC and metabolic panel values but low TSH consistent with possibly supratherapeutic thyroid supplementation, which could contribute to tremor. MRI brain without contrast obtained and notable for no acute intracranial process or clear structural etiology for his symptoms identified.   Reassessed patient and discussed above workup.  Central etiology unlikely based on reassuring imaging.  Neurologic exam unchanged at this time.  I recommended decreased dose of thyroid medication and prompt PCP follow-up.  Patient and wife will call PCP tomorrow morning to schedule appointment as soon as possible.  Strict return precautions discussed.  Patient was discharged from ED.          Final Clinical Impression(s) / ED Diagnoses Final diagnoses:  Tremor    Rx / DC Orders ED Discharge Orders     None         Karmen Stabs, MD 01/02/23 Darnell Level    Jacalyn Lefevre, MD 01/02/23 2023

## 2023-01-02 NOTE — ED Triage Notes (Signed)
Pt's mother states that week ago patient was "scared" and shaking so took to PCP who put on medication. Pt been losing weight over the past year, and they have been trying to get him to eat. Pt is a picky eater, then eats small amounts also.  Pt brought in today for left arm shaking and weakness for a week. Pt reports that shaking makes him tired.  Mother unsure if patient "getting worked up because has to get hemorrhoids banded on 11/4".  Pt ws taken to PCP because thought had an appt today, but was virtual and was to need to go to ED.

## 2023-01-02 NOTE — ED Triage Notes (Signed)
Pt c/o left arm shakinessx1wk. Pt is afraid of dying, he's nervous in brain and he worries.

## 2023-01-02 NOTE — ED Notes (Signed)
Patient is being discharged from the Urgent Care and sent to the Emergency Department via POC . Per Marcelino Duster, NP, patient is in need of higher level of care due to need of further evaluation. Patient is aware and verbalizes understanding of plan of care.  Vitals:   01/02/23 1125  BP: 133/84  Pulse: 72  Resp: 17  Temp: 98.4 F (36.9 C)  SpO2: 97%

## 2023-01-02 NOTE — ED Notes (Signed)
Called pt X 2 no answer

## 2023-01-02 NOTE — Discharge Instructions (Addendum)
Please go to to the nearest ER for further evaluation.
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
## 2023-01-16 ENCOUNTER — Ambulatory Visit: Payer: Medicare HMO | Admitting: Internal Medicine

## 2023-01-16 ENCOUNTER — Encounter: Payer: Self-pay | Admitting: Internal Medicine

## 2023-01-16 VITALS — BP 132/78 | HR 81 | Ht 69.0 in | Wt 108.0 lb

## 2023-01-16 DIAGNOSIS — K648 Other hemorrhoids: Secondary | ICD-10-CM | POA: Diagnosis not present

## 2023-01-16 DIAGNOSIS — K644 Residual hemorrhoidal skin tags: Secondary | ICD-10-CM

## 2023-01-16 NOTE — Progress Notes (Signed)
HEMORRHOID LIGATION  57 year old white man with congenital deafness, presents with mother and sign language interpreter.  He was hospitalized recently with rectal bleeding thought to be due to hemorrhoids.  There is a history of intermittent rectal bleeding and has known internal hemorrhoids from colonoscopy, he is up-to-date on colonoscopy follow-up for polyps.  Since the hospitalization the bleeding has been less if at all.  He has used hydrocortisone cream.  Suppositories were not affordable.  He denies any pain, it does sound like there are some symptoms of prolapsed hemorrhoids at times.  He is moving his bowels 2-3 times a day and they are soft without significant straining at this point.  He is not on any laxatives or fiber supplements.  Rectal exam-there is an friable external hemorrhoid seen in left lateral position, and there are palpable internal hemorrhoids in the left lateral as well.  Brown stool no mass.  Anoscopy demonstrates a grade 2 internal/external hemorrhoid complex in the left lateral position and I thought grade 1 internal hemorrhoids right anterior and posterior.   PROCEDURE NOTE: The patient presents with symptomatic grade 2 hemorrhoids, requesting rubber band ligation of his/her hemorrhoidal disease.  All risks, benefits and alternative forms of therapy were described and informed consent was obtained.   The anorectum was pre-medicated with 0.125% glycerin and 5% lidocaine The decision was made to band the left lateral internal hemorrhoid, and the [facility]an System was used to perform band ligation without complication.  Digital anorectal examination was then performed to assure proper positioning of the band, and to adjust the banded tissue as required.  The patient was discharged home without pain or other issues.  Dietary and behavioral recommendations were given and along with follow-up instructions.       The patient will return in 2 months for  follow-up and  possible additional banding as required. No complications were encountered and the patient tolerated the procedure well.

## 2023-01-16 NOTE — Patient Instructions (Signed)
HEMORRHOID BANDING PROCEDURE    FOLLOW-UP CARE   The procedure you have had should have been relatively painless since the banding of the area involved does not have nerve endings and there is no pain sensation.  The rubber band cuts off the blood supply to the hemorrhoid and the band may fall off as soon as 48 hours after the banding (the band may occasionally be seen in the toilet bowl following a bowel movement). You may notice a temporary feeling of fullness in the rectum which should respond adequately to plain Tylenol or Motrin.  Following the banding, avoid strenuous exercise that evening and resume full activity the next day.  A sitz bath (soaking in a warm tub) or bidet is soothing, and can be useful for cleansing the area after bowel movements.     To avoid constipation, take two tablespoons of natural wheat bran, natural oat bran, flax, Benefiber or any over the counter fiber supplement and increase your water intake to 7-8 glasses daily.    Unless you have been prescribed anorectal medication, do not put anything inside your rectum for two weeks: No suppositories, enemas, fingers, etc.  Occasionally, you may have more bleeding than usual after the banding procedure.  This is often from the untreated hemorrhoids rather than the treated one.  Don't be concerned if there is a tablespoon or so of blood.  If there is more blood than this, lie flat with your bottom higher than your head and apply an ice pack to the area. If the bleeding does not stop within a half an hour or if you feel faint, call our office at [phone] or go to the emergency room.  Problems are not common; however, if there is a substantial amount of bleeding, severe pain, chills, fever or difficulty passing urine (very rare) or other problems, you should call us at [phone] or report to the nearest emergency room.  Do not stay seated continuously for more than 2-3 hours for a day or two after the procedure.   Tighten your buttock muscles 10-15 times every two hours and take 10-15 deep breaths every 1-2 hours.  Do not spend more than a few minutes on the toilet if you cannot empty your bowel; instead re-visit the toilet at a later time.  I appreciate the opportunity to care for you. Carl Gessner, MD, FACG  
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
## 2023-02-03 ENCOUNTER — Other Ambulatory Visit: Payer: Self-pay | Admitting: Family Medicine

## 2023-02-03 ENCOUNTER — Encounter: Payer: Self-pay | Admitting: Neurology

## 2023-02-03 ENCOUNTER — Ambulatory Visit: Payer: Medicare HMO | Admitting: Neurology

## 2023-02-03 VITALS — BP 160/85 | HR 86 | Ht 68.0 in | Wt 111.0 lb

## 2023-02-03 DIAGNOSIS — R627 Adult failure to thrive: Secondary | ICD-10-CM

## 2023-02-03 DIAGNOSIS — F444 Conversion disorder with motor symptom or deficit: Secondary | ICD-10-CM

## 2023-02-03 DIAGNOSIS — G40109 Localization-related (focal) (partial) symptomatic epilepsy and epileptic syndromes with simple partial seizures, not intractable, without status epilepticus: Secondary | ICD-10-CM | POA: Diagnosis not present

## 2023-02-03 DIAGNOSIS — S069X9D Unspecified intracranial injury with loss of consciousness of unspecified duration, subsequent encounter: Secondary | ICD-10-CM | POA: Diagnosis not present

## 2023-02-03 DIAGNOSIS — F411 Generalized anxiety disorder: Secondary | ICD-10-CM

## 2023-02-03 DIAGNOSIS — I10 Essential (primary) hypertension: Secondary | ICD-10-CM

## 2023-02-03 NOTE — Patient Instructions (Signed)
Tremor is stress-related.   Continue Keppra 1000mg  twice daily

## 2023-02-06 ENCOUNTER — Ambulatory Visit: Payer: Medicare HMO | Admitting: Family Medicine

## 2023-02-07 ENCOUNTER — Other Ambulatory Visit: Payer: Self-pay

## 2023-02-07 DIAGNOSIS — E785 Hyperlipidemia, unspecified: Secondary | ICD-10-CM

## 2023-02-07 DIAGNOSIS — E7841 Elevated Lipoprotein(a): Secondary | ICD-10-CM

## 2023-02-07 MED ORDER — ROSUVASTATIN CALCIUM 20 MG PO TABS: ORAL_TABLET | ORAL | 0 refills | Status: DC

## 2023-02-10 ENCOUNTER — Other Ambulatory Visit: Payer: Self-pay | Admitting: Family Medicine

## 2023-02-10 DIAGNOSIS — E039 Hypothyroidism, unspecified: Secondary | ICD-10-CM

## 2023-03-03 ENCOUNTER — Ambulatory Visit: Payer: Self-pay | Admitting: Family Medicine

## 2023-03-03 NOTE — Telephone Encounter (Signed)
Copied from CRM [id number]. Topic: Clinical - Red Word Triage >> Mar 03, 2023 11:14 AM Gaetano Hawthorne wrote: Red Word that prompted transfer to Nurse Triage: Patient has been experiencing some shakiness in his left hand.   Chief Complaint: Left hand shaking Symptoms: Left hand shaking Frequency: 3 weeks-1 month Pertinent Negatives: Patient denies nausea/vomiting/diarrhea, chest pain, confusion, abdominal pain, any injuries, vision loss, double vision, headaches, dizziness Disposition: [] ED /[x] Urgent Care (no appt availability in office) / [] Appointment(In office/virtual)/ []  [facility]/ [] Home Care/ [] Refused Recommended Disposition /[] [facility]/ []  Follow-up with PCP Additional Notes: [name] called and advised that the patient's left hand has been shaking for about 3 weeks to one month.  She states that the neurologist saw this about 3 weeks ago and thought it was nerves and not parkinsons. Patient has been getting one Ativan once a night lately and it has been helping some. Patient was seen by his neurologist about 3 weeks ago and they advised that they believed this was related to his nerves and that it was not Parkinson's. Patient denies nausea/vomiting/diarrhea, chest pain, confusion, abdominal pain, any injuries, vision loss, double vision, headaches, dizziness.  No availability in the PCP office so patient's mother is advised to have the patient seen by an urgent care or an emergency room.  She is going to take him to an urgent care at this time and have him evaluated.  She is advised that if anything worsens, to take him to the emergency room or call 911.  [name] verbalized understanding.  Reason for Disposition  [1] Weakness of the face, arm / hand, or leg / foot on one side of the body AND [2] gradual onset (e.g., days to weeks) AND [3] present now  Answer Assessment - Initial Assessment Questions 1. SYMPTOM: "What is the main symptom you are concerned  about?" (e.g., weakness, numbness)     Left hand shaking 2. ONSET: "When did this start?" (minutes, hours, days; while sleeping)     3-4 weeks ago 3. LAST NORMAL: "When was the last time you (the patient) were normal (no symptoms)?"     3-4 weeks ago 4. PATTERN "Does this come and go, or has it been constant since it started?"  "Is it present now?"     constant 5. CARDIAC SYMPTOMS: "Have you had any of the following symptoms: chest pain, difficulty breathing, palpitations?"     No 6. NEUROLOGIC SYMPTOMS: "Have you had any of the following symptoms: headache, dizziness, vision loss, double vision, changes in speech, unsteady on your feet?"     Unsteady on his feet--but patient has head injuries from years ago and deaf from birth 47. OTHER SYMPTOMS: "Do you have any other symptoms?"     N/A  Protocols used: Neurologic Deficit-A-AH

## 2023-03-06 ENCOUNTER — Encounter: Payer: Medicare HMO | Admitting: Internal Medicine

## 2023-03-11 ENCOUNTER — Encounter: Payer: Self-pay | Admitting: Family Medicine

## 2023-03-11 ENCOUNTER — Ambulatory Visit (INDEPENDENT_AMBULATORY_CARE_PROVIDER_SITE_OTHER): Payer: Medicare HMO | Admitting: Family Medicine

## 2023-03-11 VITALS — BP 138/68 | HR 69 | Temp 97.8°F | Ht 68.0 in | Wt 115.0 lb

## 2023-03-11 DIAGNOSIS — R627 Adult failure to thrive: Secondary | ICD-10-CM | POA: Diagnosis not present

## 2023-03-11 DIAGNOSIS — E039 Hypothyroidism, unspecified: Secondary | ICD-10-CM

## 2023-03-11 DIAGNOSIS — E7841 Elevated Lipoprotein(a): Secondary | ICD-10-CM

## 2023-03-11 DIAGNOSIS — F411 Generalized anxiety disorder: Secondary | ICD-10-CM | POA: Diagnosis not present

## 2023-03-11 DIAGNOSIS — E785 Hyperlipidemia, unspecified: Secondary | ICD-10-CM | POA: Diagnosis not present

## 2023-03-11 DIAGNOSIS — F444 Conversion disorder with motor symptom or deficit: Secondary | ICD-10-CM

## 2023-03-11 MED ORDER — MIRTAZAPINE 30 MG PO TABS: 30.0000 mg | ORAL_TABLET | Freq: Every day | ORAL | 1 refills | Status: DC

## 2023-03-11 MED ORDER — ROSUVASTATIN CALCIUM 20 MG PO TABS
ORAL_TABLET | ORAL | 0 refills | Status: DC
Start: 2023-03-11 — End: 2023-05-07

## 2023-03-11 MED ORDER — LEVOTHYROXINE SODIUM 50 MCG PO TABS
50.0000 ug | ORAL_TABLET | Freq: Every day | ORAL | 0 refills | Status: DC
Start: 2023-03-11 — End: 2023-05-07

## 2023-03-11 MED ORDER — LORAZEPAM 0.5 MG PO TABS: 0.5000 mg | ORAL_TABLET | Freq: Every day | ORAL | 1 refills | Status: DC | PRN

## 2023-03-11 MED ORDER — ESCITALOPRAM OXALATE 10 MG PO TABS: 10.0000 mg | ORAL_TABLET | Freq: Every day | ORAL | 2 refills | Status: DC

## 2023-03-11 NOTE — Progress Notes (Signed)
 Established Patient Office Visit  Subjective   Patient ID: Damon Buckner., male    DOB: [birth year]  Age: 58 y.o. MRN: [id number]  Chief Complaint  Patient presents with   Follow Up Weight Loss    HPI Damon [name]. is a 58 y.o. male presenting today with his mother for follow up of low weight. Due to language barrier, an interpreter was present during the history-taking and subsequent discussion with this patient.  At last appointment, weight was 110 pounds.  At that time, appetite was still low but he was maintaining weight.  Recommended trial of different protein shakes and powders until he finds 1 that he likes to maintain high caloric and protein intake.  Initiated trial of Lexapro  10 mg daily for anxiety hoping that this would also improve appetite.  He continues to take mirtazapine  30 mg daily at bedtime.  Weight has increased by 4 pounds since his last appointment.  Lexapro  has really decreased how often he worries, his mother endorses that he seems happier in general.  He has also started eating more.  She reports that yesterday he ate a full hot dog in full order of French fries.  He endorses eating protein sources like hot dogs, hamburgers, 5 meat pizza.   Outpatient Medications Prior to Visit  Medication Sig   Cholecalciferol (VITAMIN D -3) 125 MCG (5000 UT) TABS Take 5,000 Units by mouth daily.   hydrOXYzine (ATARAX) 25 MG tablet Take 25 mg by mouth every 4 (four) hours as needed.   levETIRAcetam  (KEPPRA ) 1000 MG tablet Take 1 tablet BID (Patient taking differently: Take 1,000 mg by mouth 2 (two) times daily.)   Multiple Vitamin (MULTIVITAMIN WITH MINERALS) TABS tablet Take 1 tablet by mouth daily with breakfast.   TUMS 500 MG chewable tablet Chew 1-2 tablets by mouth 3 (three) times daily as needed for indigestion or heartburn.   [DISCONTINUED] escitalopram  (LEXAPRO ) 10 MG tablet Take 1 tablet (10 mg total) by mouth daily.   [DISCONTINUED] levothyroxine  (SYNTHROID ) 50 MCG  tablet TAKE 1 TABLET BY MOUTH DAILY   [DISCONTINUED] LORazepam  (ATIVAN ) 0.5 MG tablet Take 1 tablet (0.5 mg total) by mouth daily as needed for anxiety.   [DISCONTINUED] mirtazapine  (REMERON ) 30 MG tablet TAKE 1 TABLET BY MOUTH EVERY NIGHT AT BEDTIME. MAY INCREASE TO 1.5 TABLET BY MOUTH IF NEEDED AFTER 2 WEEKS   [DISCONTINUED] rosuvastatin  (CRESTOR ) 20 MG tablet TAKE 1 TABLET BY MOUTH DAILY   albuterol  (VENTOLIN  HFA) 108 (90 Base) MCG/ACT inhaler Inhale 2 puffs into the lungs every 8 (eight) hours as needed for wheezing or shortness of breath.   No facility-administered medications prior to visit.    ROS Negative unless otherwise noted in HPI   Objective:     BP 138/68   Pulse 69   Temp 97.8 F (36.6 C) (Oral)   Ht 5' 8 (1.727 m)   Wt 115 lb 0.1 oz (52.2 kg)   SpO2 100%   BMI 17.49 kg/m   Physical Exam Vitals reviewed.  Constitutional:      General: He is not in acute distress.    Appearance: Normal appearance. He is not ill-appearing.  HENT:     Head: Normocephalic and atraumatic.     Nose: Nose normal.  Eyes:     Conjunctiva/sclera: Conjunctivae normal.  Pulmonary:     Effort: Pulmonary effort is normal.  Musculoskeletal:     Cervical back: Normal range of motion.  Skin:    Coloration: Skin is  not jaundiced or pale.     Findings: No bruising.  Neurological:     Mental Status: He is alert and oriented to person, place, and time.  Psychiatric:        Mood and Affect: Mood normal.      Assessment & Plan:  FTT (failure to thrive) in adult Assessment & Plan: Appetite has improved and weight has increased by 4 pounds since last appointment with primary care.  Continue mirtazapine  30 mg at bedtime, Lexapro  10 mg daily, and increasing caloric and protein intake.  Will continue to monitor closely.  Orders: -     Mirtazapine ; Take 1 tablet (30 mg total) by mouth at bedtime.  Dispense: 90 tablet; Refill: 1  Elevated lipoprotein(a) Assessment & Plan: Last lipid  panel: LDL 57, HDL improved to 39, triglycerides 77.  Continue rosuvastatin  20 mg daily.  Will continue to monitor.  Orders: -     Rosuvastatin  Calcium ; TAKE 1 TABLET BY MOUTH DAILY  Dispense: 90 tablet; Refill: 0  Hyperlipidemia, unspecified hyperlipidemia type Assessment & Plan: Last lipid panel: LDL 57, HDL improved to 39, triglycerides 77.  Continue rosuvastatin  20 mg daily.  Will continue to monitor.  Orders: -     Rosuvastatin  Calcium ; TAKE 1 TABLET BY MOUTH DAILY  Dispense: 90 tablet; Refill: 0  Generalized anxiety disorder Assessment & Plan: Continue Lexapro  10 mg daily as this has significantly increased worrying.  Also provided refill of Ativan  to use as needed for breakthrough anxiety.  PDMP reviewed, overdose risk score 410.  Continue mirtazapine  30 mg nightly as well for anxiety and appetite.  Orders: -     Mirtazapine ; Take 1 tablet (30 mg total) by mouth at bedtime.  Dispense: 90 tablet; Refill: 1 -     LORazepam ; Take 1 tablet (0.5 mg total) by mouth daily as needed for anxiety.  Dispense: 30 tablet; Refill: 1 -     Escitalopram  Oxalate; Take 1 tablet (10 mg total) by mouth daily.  Dispense: 90 tablet; Refill: 2  Hypothyroidism, unspecified type -     Levothyroxine  Sodium; Take 1 tablet (50 mcg total) by mouth daily.  Dispense: 90 tablet; Refill: 0  Psychogenic tremor Assessment & Plan: Identified as psychogenic tremor by neurology.  Per neurology note from 02/03/2023: On 10/30, he woke up with left arm tremor.  It comes and goes.  .  Arm feels cold from hand to elbow.  No tingling.  No weakness.  No neck pain.  Over the past couple of years, neck started to flex more.  He had a a panic attack a couple of days prior.  The next day he went to the ED at [facility].  Noted on exam to have bilateral tremor vas dysmetria (left greater than right).  CBC and metabolic panel unremarkable but TSH was low at 0.309.  MRI of brain without contrast personally reviewed revealed  known encephalomalacia involving majority of lateral left temporal lobe and inferolateral left frontal lobe as well as moderately advanced cerebral and cerebellar atrophy with prominence of the vermis, but no acute intracranial abnormality.  Due to low TSH, recommended levothyroxine .  He continues to have tremor in the left hand.  It tends to stop when he is still in bed.  It starts in the morning after getting up from bed.  He feels less anxious at night but it returns in the morning.  Mother has Parkinson's disease.       Return in about 2 months (around 05/09/2023)  for follow-up for weight loss (FTT).    Joesph DELENA Sear, PA

## 2023-03-11 NOTE — Assessment & Plan Note (Signed)
 Identified as psychogenic tremor by neurology.  Per neurology note from 02/03/2023: On 10/30, he woke up with left arm tremor.  It comes and goes.  .  Arm feels cold from hand to elbow.  No tingling.  No weakness.  No neck pain.  Over the past couple of years, neck started to flex more.  He had a a panic attack a couple of days prior.  The next day he went to the ED at [facility].  Noted on exam to have bilateral tremor vas dysmetria (left greater than right).  CBC and metabolic panel unremarkable but TSH was low at 0.309.  MRI of brain without contrast personally reviewed revealed known encephalomalacia involving majority of lateral left temporal lobe and inferolateral left frontal lobe as well as moderately advanced cerebral and cerebellar atrophy with prominence of the vermis, but no acute intracranial abnormality.  Due to low TSH, recommended levothyroxine .  He continues to have tremor in the left hand.  It tends to stop when he is still in bed.  It starts in the morning after getting up from bed.  He feels less anxious at night but it returns in the morning.  Mother has Parkinson's disease.

## 2023-03-11 NOTE — Assessment & Plan Note (Signed)
 Appetite has improved and weight has increased by 4 pounds since last appointment with primary care.  Continue mirtazapine 30 mg at bedtime, Lexapro 10 mg daily, and increasing caloric and protein intake.  Will continue to monitor closely.

## 2023-03-11 NOTE — Assessment & Plan Note (Signed)
 Last lipid panel: LDL 57, HDL improved to 39, triglycerides 77.  Continue rosuvastatin 20 mg daily.  Will continue to monitor.

## 2023-03-11 NOTE — Patient Instructions (Signed)
Keep up the AMAZING work!

## 2023-03-11 NOTE — Assessment & Plan Note (Signed)
 Continue Lexapro  10 mg daily as this has significantly increased worrying.  Also provided refill of Ativan  to use as needed for breakthrough anxiety.  PDMP reviewed, overdose risk score 410.  Continue mirtazapine  30 mg nightly as well for anxiety and appetite.

## 2023-03-25 ENCOUNTER — Other Ambulatory Visit: Payer: Self-pay | Admitting: Neurology

## 2023-04-10 ENCOUNTER — Encounter: Payer: Self-pay | Admitting: Family Medicine

## 2023-05-07 ENCOUNTER — Ambulatory Visit (INDEPENDENT_AMBULATORY_CARE_PROVIDER_SITE_OTHER): Payer: Medicare HMO | Admitting: Family Medicine

## 2023-05-07 ENCOUNTER — Encounter: Payer: Self-pay | Admitting: Family Medicine

## 2023-05-07 VITALS — BP 144/78 | HR 79 | Ht 68.0 in | Wt 131.2 lb

## 2023-05-07 DIAGNOSIS — F411 Generalized anxiety disorder: Secondary | ICD-10-CM

## 2023-05-07 DIAGNOSIS — R627 Adult failure to thrive: Secondary | ICD-10-CM

## 2023-05-07 DIAGNOSIS — I10 Essential (primary) hypertension: Secondary | ICD-10-CM | POA: Diagnosis not present

## 2023-05-07 DIAGNOSIS — E039 Hypothyroidism, unspecified: Secondary | ICD-10-CM

## 2023-05-07 DIAGNOSIS — E785 Hyperlipidemia, unspecified: Secondary | ICD-10-CM

## 2023-05-07 MED ORDER — MIRTAZAPINE 30 MG PO TABS: 30.0000 mg | ORAL_TABLET | Freq: Every day | ORAL | 1 refills | Status: DC

## 2023-05-07 MED ORDER — AMLODIPINE BESYLATE 5 MG PO TABS
5.0000 mg | ORAL_TABLET | Freq: Every day | ORAL | 3 refills | Status: DC
Start: 2023-05-07 — End: 2023-09-25

## 2023-05-07 MED ORDER — ROSUVASTATIN CALCIUM 20 MG PO TABS: ORAL_TABLET | ORAL | 0 refills | Status: DC

## 2023-05-07 MED ORDER — LEVOTHYROXINE SODIUM 50 MCG PO TABS
50.0000 ug | ORAL_TABLET | Freq: Every day | ORAL | 0 refills | Status: DC
Start: 2023-05-07 — End: 2023-10-28

## 2023-05-07 NOTE — Assessment & Plan Note (Signed)
 Patient was previously on Losartan-HCTZ and with recurrent AKI blood pressure medication was discontinued. Patient has been managing with diet, given recent increases in blood pressure recommend restarting blood pressure medication.  Initiate low-dose amlodipine 5 mg daily and continue close monitoring of blood pressure.

## 2023-05-07 NOTE — Progress Notes (Signed)
 Established Patient Office Visit  Subjective   Patient ID: Damon [name]., male    DOB: Jul 29, 1965  Age: 58 y.o. MRN: [id number]  Chief Complaint  Patient presents with   Weight Management Screening    HPI Damon [name]. is a 58 y.o. male presenting today for follow up of weight loss/FTT.  After his surgery and the addition of Lexapro reduced his anxiety particularly surrounding eating, his appetite continues to increase.  Currently taking Lexapro 10 mg daily, mirtazapine 30 mg at bedtime, and intentionally trying to eat more protein and more calories.  He continues to follow with neurology and is taking Keppra 1000 mg twice daily, they are also monitoring his psychogenic tremor.  His blood pressure was elevated at his neurology appointment and is likewise elevated upon presentation to primary care office today.  Outpatient Medications Prior to Visit  Medication Sig   Cholecalciferol (VITAMIN D-3) 125 MCG (5000 UT) TABS Take 5,000 Units by mouth daily.   escitalopram (LEXAPRO) 10 MG tablet Take 1 tablet (10 mg total) by mouth daily.   hydrOXYzine (ATARAX) 25 MG tablet Take 25 mg by mouth every 4 (four) hours as needed.   levETIRAcetam (KEPPRA) 1000 MG tablet TAKE 1 TABLET BY MOUTH TWICE DAILY   LORazepam (ATIVAN) 0.5 MG tablet Take 1 tablet (0.5 mg total) by mouth daily as needed for anxiety.   Multiple Vitamin (MULTIVITAMIN WITH MINERALS) TABS tablet Take 1 tablet by mouth daily with breakfast.   TUMS 500 MG chewable tablet Chew 1-2 tablets by mouth 3 (three) times daily as needed for indigestion or heartburn.   albuterol (VENTOLIN HFA) 108 (90 Base) MCG/ACT inhaler Inhale 2 puffs into the lungs every 8 (eight) hours as needed for wheezing or shortness of breath.   [DISCONTINUED] levothyroxine (SYNTHROID) 50 MCG tablet Take 1 tablet (50 mcg total) by mouth daily.   [DISCONTINUED] mirtazapine (REMERON) 30 MG tablet Take 1 tablet (30 mg total) by mouth at bedtime.   [DISCONTINUED]  rosuvastatin (CRESTOR) 20 MG tablet TAKE 1 TABLET BY MOUTH DAILY   No facility-administered medications prior to visit.    ROS Negative unless otherwise noted in HPI   Objective:     BP (!) 144/78   Pulse 79   Ht 5\' 8"  (1.727 m)   Wt 131 lb 4 oz (59.5 kg)   SpO2 97%   BMI 19.96 kg/m   Physical Exam Constitutional:      General: He is not in acute distress.    Appearance: Normal appearance.  HENT:     Head: Normocephalic and atraumatic.  Cardiovascular:     Rate and Rhythm: Normal rate and regular rhythm.     Heart sounds: Normal heart sounds. No murmur heard.    No friction rub. No gallop.  Pulmonary:     Effort: Pulmonary effort is normal. No respiratory distress.     Breath sounds: Normal breath sounds. No wheezing, rhonchi or rales.  Skin:    General: Skin is warm and dry.  Neurological:     Mental Status: He is alert and oriented to person, place, and time.  Psychiatric:        Mood and Affect: Mood normal.      Assessment & Plan:  HTN, goal below 130/80 Assessment & Plan: Patient was previously on Losartan-HCTZ and with recurrent AKI blood pressure medication was discontinued. Patient has been managing with diet, given recent increases in blood pressure recommend restarting blood pressure medication.  Initiate low-dose  amlodipine 5 mg daily and continue close monitoring of blood pressure.    Orders: -     amLODIPine Besylate; Take 1 tablet (5 mg total) by mouth daily.  Dispense: 30 tablet; Refill: 3  FTT (failure to thrive) in adult Assessment & Plan: Appetite has improved and weight has increased by 16 pounds since last appointment with primary care.  Continue mirtazapine 30 mg at bedtime, Lexapro 10 mg daily, and increasing caloric and protein intake.  Will continue to monitor closely.  Orders: -     Mirtazapine; Take 1 tablet (30 mg total) by mouth at bedtime.  Dispense: 90 tablet; Refill: 1  Hyperlipidemia, unspecified hyperlipidemia type -      Rosuvastatin Calcium; TAKE 1 TABLET BY MOUTH DAILY  Dispense: 90 tablet; Refill: 0  Generalized anxiety disorder -     Mirtazapine; Take 1 tablet (30 mg total) by mouth at bedtime.  Dispense: 90 tablet; Refill: 1  Hypothyroidism, unspecified type -     Levothyroxine Sodium; Take 1 tablet (50 mcg total) by mouth daily.  Dispense: 90 tablet; Refill: 0    Return in about 2 months (around 07/07/2023) for follow up for HTN, FTT, thyroid, mood, fasting labs 1 week before. Check CBC, CMP, TSH, free T4, lipid panel, A1C.   Melida Quitter, PA

## 2023-05-07 NOTE — Assessment & Plan Note (Signed)
 Appetite has improved and weight has increased by 16 pounds since last appointment with primary care.  Continue mirtazapine 30 mg at bedtime, Lexapro 10 mg daily, and increasing caloric and protein intake.  Will continue to monitor closely.

## 2023-05-07 NOTE — Patient Instructions (Signed)
 Once you start the new blood pressure medicine, monitor your blood pressure at home.  I recommend checking twice a day: First about 1-2 hours after taking your blood pressure medicine in the morning, and the second time in the evening.

## 2023-05-10 ENCOUNTER — Other Ambulatory Visit: Payer: Self-pay | Admitting: Family Medicine

## 2023-05-10 DIAGNOSIS — E039 Hypothyroidism, unspecified: Secondary | ICD-10-CM

## 2023-08-14 ENCOUNTER — Other Ambulatory Visit: Payer: Self-pay | Admitting: *Deleted

## 2023-08-14 DIAGNOSIS — E785 Hyperlipidemia, unspecified: Secondary | ICD-10-CM

## 2023-08-14 DIAGNOSIS — E039 Hypothyroidism, unspecified: Secondary | ICD-10-CM

## 2023-08-14 DIAGNOSIS — R7309 Other abnormal glucose: Secondary | ICD-10-CM

## 2023-08-14 DIAGNOSIS — E559 Vitamin D deficiency, unspecified: Secondary | ICD-10-CM

## 2023-08-18 ENCOUNTER — Other Ambulatory Visit

## 2023-08-18 DIAGNOSIS — R7309 Other abnormal glucose: Secondary | ICD-10-CM

## 2023-08-18 DIAGNOSIS — E039 Hypothyroidism, unspecified: Secondary | ICD-10-CM

## 2023-08-18 DIAGNOSIS — E785 Hyperlipidemia, unspecified: Secondary | ICD-10-CM

## 2023-08-18 DIAGNOSIS — E559 Vitamin D deficiency, unspecified: Secondary | ICD-10-CM

## 2023-08-19 ENCOUNTER — Ambulatory Visit: Payer: Self-pay

## 2023-08-19 LAB — COMPREHENSIVE METABOLIC PANEL WITH GFR
ALT: 20 IU/L (ref 0–44)
AST: 24 IU/L (ref 0–40)
Albumin: 4.4 g/dL (ref 3.8–4.9)
Alkaline Phosphatase: 92 IU/L (ref 44–121)
BUN/Creatinine Ratio: 9 (ref 9–20)
BUN: 9 mg/dL (ref 6–24)
Bilirubin Total: 0.3 mg/dL (ref 0.0–1.2)
CO2: 23 mmol/L (ref 20–29)
Calcium: 9.5 mg/dL (ref 8.7–10.2)
Chloride: 104 mmol/L (ref 96–106)
Creatinine, Ser: 1.05 mg/dL (ref 0.76–1.27)
Globulin, Total: 2.5 g/dL (ref 1.5–4.5)
Glucose: 73 mg/dL (ref 70–99)
Potassium: 4.2 mmol/L (ref 3.5–5.2)
Sodium: 144 mmol/L (ref 134–144)
Total Protein: 6.9 g/dL (ref 6.0–8.5)
eGFR: 82 mL/min/{1.73_m2} (ref >59)

## 2023-08-19 LAB — CBC WITH DIFFERENTIAL/PLATELET
Basophils Absolute: 0.1 10*3/uL (ref 0.0–0.2)
Basos: 1 %
EOS (ABSOLUTE): 0.2 10*3/uL (ref 0.0–0.4)
Eos: 2 %
Hematocrit: 44.1 % (ref 37.5–51.0)
Hemoglobin: 13.8 g/dL (ref 13.0–17.7)
Immature Grans (Abs): 0 10*3/uL (ref 0.0–0.1)
Immature Granulocytes: 0 %
Lymphocytes Absolute: 1.1 10*3/uL (ref 0.7–3.1)
Lymphs: 16 %
MCH: 28 pg (ref 26.6–33.0)
MCHC: 31.3 g/dL — ABNORMAL LOW (ref 31.5–35.7)
MCV: 90 fL (ref 79–97)
Monocytes Absolute: 0.5 10*3/uL (ref 0.1–0.9)
Monocytes: 7 %
Neutrophils Absolute: 4.7 10*3/uL (ref 1.4–7.0)
Neutrophils: 74 %
Platelets: 173 10*3/uL (ref 150–450)
RBC: 4.93 x10E6/uL (ref 4.14–5.80)
RDW: 14 % (ref 11.6–15.4)
WBC: 6.5 10*3/uL (ref 3.4–10.8)

## 2023-08-19 LAB — LIPID PANEL
Chol/HDL Ratio: 4.2 ratio (ref 0.0–5.0)
Cholesterol, Total: 155 mg/dL (ref 100–199)
HDL: 37 mg/dL — ABNORMAL LOW (ref >39)
LDL Chol Calc (NIH): 96 mg/dL (ref 0–99)
Triglycerides: 123 mg/dL (ref 0–149)
VLDL Cholesterol Cal: 22 mg/dL (ref 5–40)

## 2023-08-19 LAB — VITAMIN D 25 HYDROXY (VIT D DEFICIENCY, FRACTURES): Vit D, 25-Hydroxy: 56.9 ng/mL (ref 30.0–100.0)

## 2023-08-19 LAB — HEMOGLOBIN A1C
Est. average glucose Bld gHb Est-mCnc: 108 mg/dL
Hgb A1c MFr Bld: 5.4 % (ref 4.8–5.6)

## 2023-08-19 LAB — TSH: TSH: 1.84 u[IU]/mL (ref 0.450–4.500)

## 2023-08-25 ENCOUNTER — Ambulatory Visit (INDEPENDENT_AMBULATORY_CARE_PROVIDER_SITE_OTHER)

## 2023-08-25 VITALS — BP 158/78 | HR 72 | Ht 68.0 in | Wt 139.1 lb

## 2023-08-25 DIAGNOSIS — E039 Hypothyroidism, unspecified: Secondary | ICD-10-CM

## 2023-08-25 DIAGNOSIS — J45909 Unspecified asthma, uncomplicated: Secondary | ICD-10-CM

## 2023-08-25 DIAGNOSIS — Z1211 Encounter for screening for malignant neoplasm of colon: Secondary | ICD-10-CM | POA: Diagnosis not present

## 2023-08-25 DIAGNOSIS — R627 Adult failure to thrive: Secondary | ICD-10-CM

## 2023-08-25 DIAGNOSIS — F411 Generalized anxiety disorder: Secondary | ICD-10-CM

## 2023-08-25 DIAGNOSIS — I10 Essential (primary) hypertension: Secondary | ICD-10-CM

## 2023-08-25 DIAGNOSIS — E785 Hyperlipidemia, unspecified: Secondary | ICD-10-CM | POA: Diagnosis not present

## 2023-08-25 MED ORDER — ALBUTEROL SULFATE HFA 108 (90 BASE) MCG/ACT IN AERS: 2.0000 | INHALATION_SPRAY | Freq: Three times a day (TID) | RESPIRATORY_TRACT | 1 refills | Status: AC | PRN

## 2023-08-25 NOTE — Assessment & Plan Note (Signed)
 Patient not currently taking Synthroid .  Follows with Dr. Lawence.  Most recent TSH 1.84.  Given that TSH is within normal range and patient is asymptomatic, okay to continue without levothyroxine .

## 2023-08-25 NOTE — Assessment & Plan Note (Signed)
 Increased Lexapro  from 10 mg daily to 15 mg daily due to constant anxiety.  Continue Ativan  as needed for breakthrough anxiety.  Continue mirtazapine  30 mg nightly for anxiety and appetite.  Will continue to monitor.

## 2023-08-25 NOTE — Progress Notes (Signed)
 Established Patient Office Visit  Subjective   Patient ID: Damon [name]., male    DOB: 1965-06-04  Age: 58 y.o. MRN: [id number]  Chief Complaint  Patient presents with   Medical Management of Chronic Issues    HPI  Damon R Damon [name]. is a 58 y.o. y/o male who presents to the clinic today for follow up on HTN, Thyroid , FTT, mood.   Due to language barrier, an interpreter was present during the history-taking and subsequent discussion (and for part of the physical exam) with this patient.  HTN: Patient currently taking amlodipine  for control of their blood pressure. Reports excellence compliance with this medication. Denies side effects or episodes of hypotension. Checks BP at home periodically and reports average readings of systolic 110-120, diastolic 70s to 80s.  Patient is accompanied by his mother who reports they check blood pressure every day and record on a piece of paper, however they forgot to bring the piece of paper with him to the office today.  Denies CP, SOB, Palpitations, vision changes, HA, or edema.  Thyroid : Patient previously taking 50 mcg of levothyroxine  daily.  However he has not been taking this medication, and patient and his mother cannot remember why he is not taking it.  Denies side effects of fatigue, skin changes, weight changes, or bowel habit changes.   FTT:  Currently taking Lexapro  10 mg daily, mirtazapine  30 mg at bedtime, and intentionally trying to eat more protein and more calories.  Patient is tolerating this medication well.  Denies side effects.  Is happy at his current dose.  Mood: Reports his mood is overall stable and improved since starting Lexapro .  Does report that he is still very anxious, reporting that he is constantly worrying throughout the day.  Reports that most of his anxiety is due to living with his dad who suffers from Alzheimer's disease.  He takes Ativan  as needed for increased anxiety which helps him.    ROS Per HPI.     Objective:     BP (!) 158/78   Pulse 72   Ht 5' 8 (1.727 m)   Wt 139 lb 1.9 oz (63.1 kg)   SpO2 98%   BMI 21.15 kg/m    Physical Exam Constitutional:      General: He is not in acute distress.    Appearance: Normal appearance.  HENT:     Mouth/Throat:     Mouth: Mucous membranes are moist.     Pharynx: Oropharynx is clear. No oropharyngeal exudate.   Cardiovascular:     Rate and Rhythm: Normal rate and regular rhythm.     Heart sounds: Normal heart sounds. No murmur heard.    No friction rub. No gallop.  Pulmonary:     Effort: Pulmonary effort is normal. No respiratory distress.     Breath sounds: Normal breath sounds.  Abdominal:     General: Abdomen is flat.     Palpations: Abdomen is soft.   Musculoskeletal:        General: No swelling.     Cervical back: Neck supple.  Lymphadenopathy:     Cervical: No cervical adenopathy.   Skin:    General: Skin is warm and dry.   Neurological:     General: No focal deficit present.     Mental Status: He is alert.   Psychiatric:        Mood and Affect: Mood normal.        Behavior: Behavior normal.  Thought Content: Thought content normal.      No results found for any visits on 08/25/23.  Last CBC Lab Results  Component Value Date   WBC 6.5 08/18/2023   HGB 13.8 08/18/2023   HCT 44.1 08/18/2023   MCV 90 08/18/2023   MCH 28.0 08/18/2023   RDW 14.0 08/18/2023   PLT 173 08/18/2023   Last metabolic panel Lab Results  Component Value Date   GLUCOSE 73 08/18/2023   NA 144 08/18/2023   K 4.2 08/18/2023   CL 104 08/18/2023   CO2 23 08/18/2023   BUN 9 08/18/2023   CREATININE 1.05 08/18/2023   EGFR 82 08/18/2023   CALCIUM  9.5 08/18/2023   PHOS 3.3 04/04/2020   PROT 6.9 08/18/2023   ALBUMIN 4.4 08/18/2023   LABGLOB 2.5 08/18/2023   AGRATIO 1.7 06/06/2022   BILITOT 0.3 08/18/2023   ALKPHOS 92 08/18/2023   AST 24 08/18/2023   ALT 20 08/18/2023   ANIONGAP 5 01/02/2023   Last lipids Lab Results   Component Value Date   CHOL 155 08/18/2023   HDL 37 (L) 08/18/2023   LDLCALC 96 08/18/2023   TRIG 123 08/18/2023   CHOLHDL 4.2 08/18/2023   Last hemoglobin A1c Lab Results  Component Value Date   HGBA1C 5.4 08/18/2023   Last thyroid  functions Lab Results  Component Value Date   TSH 1.840 08/18/2023   T3TOTAL 107 02/22/2022   Last vitamin D  Lab Results  Component Value Date   VD25OH 56.9 08/18/2023      The 10-year ASCVD risk score (Arnett DK, et al., 2019) is: 11.9%    Assessment & Plan:   Screen for colon cancer -     Ambulatory referral to Gastroenterology  Reactive airway disease without complication, unspecified asthma severity, unspecified whether persistent -     Albuterol  Sulfate HFA; Inhale 2 puffs into the lungs every 8 (eight) hours as needed for wheezing or shortness of breath.  Dispense: 76.5 each; Refill: 1  Hypothyroidism, unspecified type Assessment & Plan: Patient not currently taking Synthroid .  Follows with Dr. Lawence.  Most recent TSH 1.84.  Given that TSH is within normal range and patient is asymptomatic, okay to continue without levothyroxine .   Hyperlipidemia, unspecified hyperlipidemia type Assessment & Plan: Last lipid panel: LDL 96, HDL 37, triglycerides 123.  Continue rosuvastatin  20 mg daily.  Will continue to monitor.   HTN, goal below 130/80 Assessment & Plan: Blood pressure goal <130/80.  Blood pressure above goal in office and on recheck today.  Given that patient's ambulatory blood pressure readings are at goal, will continue amlodipine  5 mg daily for blood pressure.  Recommended that patient continue ambulatory blood pressure monitoring and bring the readings with him to their next visit.  Also advised patient to notify if blood pressure is consistently above 130/80 and we will increase his amlodipine  dose.  Will continue to monitor.   FTT (failure to thrive) in adult Assessment & Plan: Patient has gained 24 pounds since his  initial appointment in January.  Patient is happy with this weight gain and would like to continue on with the current dose.  Continue mirtazapine  30 mg at bedtime and continue working to increase caloric and protein intake.  Will continue to monitor.   Generalized anxiety disorder Assessment & Plan: Increased Lexapro  from 10 mg daily to 15 mg daily due to constant anxiety.  Continue Ativan  as needed for breakthrough anxiety.  Continue mirtazapine  30 mg nightly for anxiety and appetite.  Will  continue to monitor.     Return in about 6 months (around 02/24/2024) for HTN, Mood, thyroid , FTT .    Saddie JULIANNA Sacks, PA-C

## 2023-08-25 NOTE — Assessment & Plan Note (Signed)
 Patient has gained 24 pounds since his initial appointment in January.  Patient is happy with this weight gain and would like to continue on with the current dose.  Continue mirtazapine  30 mg at bedtime and continue working to increase caloric and protein intake.  Will continue to monitor.

## 2023-08-25 NOTE — Assessment & Plan Note (Signed)
 Blood pressure goal <130/80.  Blood pressure above goal in office and on recheck today.  Given that patient's ambulatory blood pressure readings are at goal, will continue amlodipine  5 mg daily for blood pressure.  Recommended that patient continue ambulatory blood pressure monitoring and bring the readings with him to their next visit.  Also advised patient to notify if blood pressure is consistently above 130/80 and we will increase his amlodipine  dose.  Will continue to monitor.

## 2023-08-25 NOTE — Assessment & Plan Note (Signed)
 Last lipid panel: LDL 96, HDL 37, triglycerides 123.  Continue rosuvastatin  20 mg daily.  Will continue to monitor.

## 2023-08-25 NOTE — Patient Instructions (Signed)
 It was nice to see you today!  As we discussed in clinic  -All of your lab work was stable and reassuring -We have increased your Lexapro  from 10 mg to 15 mg daily to help with anxiety. Please take 1 pill and a half for a total of 15 mg.  -I have also placed a referral for colonoscopy. They will be giving you a call in the coming days to schedule this appointment. -Continue your blood pressure medication and please bring the recordings of your home readings to your next visit.  -I will plan on seeing you back in 6 months for follow up with fasting labs the week before.  It was nice to meet you!   If you have any problems before your next visit feel free to message me via MyChart (minor issues or questions) or call the office, otherwise you may reach out to schedule an office visit.  Thank you! Saddie Sacks, PA-C

## 2023-09-02 ENCOUNTER — Other Ambulatory Visit: Payer: Self-pay | Admitting: Neurology

## 2023-09-25 ENCOUNTER — Other Ambulatory Visit: Payer: Self-pay | Admitting: Family Medicine

## 2023-09-25 DIAGNOSIS — I10 Essential (primary) hypertension: Secondary | ICD-10-CM

## 2023-10-21 ENCOUNTER — Other Ambulatory Visit: Payer: Self-pay | Admitting: Family Medicine

## 2023-10-21 DIAGNOSIS — R627 Adult failure to thrive: Secondary | ICD-10-CM

## 2023-10-21 DIAGNOSIS — F411 Generalized anxiety disorder: Secondary | ICD-10-CM

## 2023-10-21 DIAGNOSIS — E785 Hyperlipidemia, unspecified: Secondary | ICD-10-CM

## 2023-10-28 ENCOUNTER — Other Ambulatory Visit: Payer: Self-pay | Admitting: Family Medicine

## 2023-10-28 DIAGNOSIS — E039 Hypothyroidism, unspecified: Secondary | ICD-10-CM

## 2023-11-25 ENCOUNTER — Other Ambulatory Visit: Payer: Self-pay

## 2023-12-22 ENCOUNTER — Other Ambulatory Visit: Payer: Self-pay | Admitting: Family Medicine

## 2023-12-22 DIAGNOSIS — F32A Depression, unspecified: Secondary | ICD-10-CM

## 2023-12-25 ENCOUNTER — Other Ambulatory Visit: Payer: Self-pay | Admitting: Family Medicine

## 2023-12-25 DIAGNOSIS — F411 Generalized anxiety disorder: Secondary | ICD-10-CM

## 2024-01-09 ENCOUNTER — Other Ambulatory Visit: Payer: Self-pay

## 2024-01-09 DIAGNOSIS — E039 Hypothyroidism, unspecified: Secondary | ICD-10-CM
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
## 2024-02-03 ENCOUNTER — Ambulatory Visit: Payer: Self-pay | Admitting: Neurology

## 2024-02-03 ENCOUNTER — Encounter: Payer: Self-pay | Admitting: Neurology

## 2024-02-10 ENCOUNTER — Telehealth: Payer: Self-pay | Admitting: Neurology

## 2024-02-10 ENCOUNTER — Other Ambulatory Visit: Payer: Self-pay

## 2024-02-10 DIAGNOSIS — I10 Essential (primary) hypertension: Secondary | ICD-10-CM

## 2024-02-10 MED ORDER — AMLODIPINE BESYLATE 5 MG PO TABS: 5.0000 mg | ORAL_TABLET | Freq: Every day | ORAL | 3 refills | Status: DC

## 2024-02-10 MED ORDER — LEVETIRACETAM 1000 MG PO TABS: 1000.0000 mg | ORAL_TABLET | Freq: Two times a day (BID) | ORAL | 0 refills | Status: DC

## 2024-02-10 NOTE — Telephone Encounter (Signed)
 Left message with the after hour service on 02-10-24 at 9:10 am   Caller states that patient needs a refill on the levetiracetam  MG 1000

## 2024-02-10 NOTE — Telephone Encounter (Signed)
 Copied from CRM [id number]. Topic: Clinical - Medication Refill >> Feb 10, 2024  9:14 AM Damon [name] wrote: Medication: amLODipine  (NORVASC ) 5 MG tablet  Has the patient contacted their pharmacy? Yes (Agent: If no, request that the patient contact the pharmacy for the refill. If patient does not wish to contact the pharmacy document the reason why and proceed with request.) (Agent: If yes, when and what did the pharmacy advise?) said to call us   This is the patient's preferred pharmacy:  [id number] [address], [place] - [address] AT [facility] & [address] [place] [address] Phone: [phone] Fax: [phone]  Is this the correct pharmacy for this prescription? Yes If no, delete pharmacy and type the correct one.   Has the prescription been filled recently? unsure  Is the patient out of the medication? Yes  Has the patient been seen for an appointment in the last year OR does the patient have an upcoming appointment? Yes  Can we respond through MyChart? No  Agent: Please be advised that Rx refills may take up to 3 business days. We ask that you follow-up with your pharmacy.

## 2024-02-10 NOTE — Telephone Encounter (Signed)
 Patient mother advised refills sent into the pharmacy.

## 2024-02-23 ENCOUNTER — Ambulatory Visit (INDEPENDENT_AMBULATORY_CARE_PROVIDER_SITE_OTHER)

## 2024-02-23 VITALS — BP 146/76 | HR 85 | Temp 98.1°F | Ht 68.0 in | Wt 151.1 lb

## 2024-02-23 DIAGNOSIS — E039 Hypothyroidism, unspecified: Secondary | ICD-10-CM | POA: Diagnosis not present

## 2024-02-23 DIAGNOSIS — G40109 Localization-related (focal) (partial) symptomatic epilepsy and epileptic syndromes with simple partial seizures, not intractable, without status epilepticus: Secondary | ICD-10-CM | POA: Diagnosis not present

## 2024-02-23 DIAGNOSIS — F411 Generalized anxiety disorder: Secondary | ICD-10-CM

## 2024-02-23 DIAGNOSIS — I10 Essential (primary) hypertension: Secondary | ICD-10-CM

## 2024-02-23 DIAGNOSIS — Z1211 Encounter for screening for malignant neoplasm of colon: Secondary | ICD-10-CM | POA: Diagnosis not present

## 2024-02-23 DIAGNOSIS — R7309 Other abnormal glucose: Secondary | ICD-10-CM | POA: Diagnosis not present

## 2024-02-23 DIAGNOSIS — E559 Vitamin D deficiency, unspecified: Secondary | ICD-10-CM

## 2024-02-23 DIAGNOSIS — E785 Hyperlipidemia, unspecified: Secondary | ICD-10-CM

## 2024-02-23 DIAGNOSIS — J029 Acute pharyngitis, unspecified: Secondary | ICD-10-CM | POA: Insufficient documentation

## 2024-02-23 DIAGNOSIS — R627 Adult failure to thrive: Secondary | ICD-10-CM | POA: Diagnosis not present

## 2024-02-23 DIAGNOSIS — G40909 Epilepsy, unspecified, not intractable, without status epilepticus: Secondary | ICD-10-CM

## 2024-02-23 MED ORDER — ESCITALOPRAM OXALATE 10 MG PO TABS: 10.0000 mg | ORAL_TABLET | Freq: Every day | ORAL | 2 refills | Status: AC

## 2024-02-23 MED ORDER — LEVOTHYROXINE SODIUM 50 MCG PO TABS: 50.0000 ug | ORAL_TABLET | Freq: Every day | ORAL | 2 refills | Status: AC

## 2024-02-23 MED ORDER — MIRTAZAPINE 30 MG PO TABS: 30.0000 mg | ORAL_TABLET | Freq: Every day | ORAL | 2 refills | Status: AC

## 2024-02-23 MED ORDER — AMLODIPINE BESYLATE 5 MG PO TABS: 5.0000 mg | ORAL_TABLET | Freq: Every day | ORAL | 2 refills | Status: AC

## 2024-02-23 MED ORDER — LORAZEPAM 0.5 MG PO TABS: 0.5000 mg | ORAL_TABLET | Freq: Every day | ORAL | 0 refills | Status: AC | PRN

## 2024-02-23 MED ORDER — ROSUVASTATIN CALCIUM 20 MG PO TABS: 20.0000 mg | ORAL_TABLET | Freq: Every day | ORAL | 2 refills | Status: AC

## 2024-02-23 NOTE — Assessment & Plan Note (Signed)
 Blood pressure goal <130/80. Blood pressure above goal in office and on recheck today. Given that patient's ambulatory blood pressure readings are at goal, will continue amlodipine  5 mg daily for blood pressure. Recommended that patient continue ambulatory blood pressure monitoring. Also advised patient to notify if blood pressure is consistently above 130/80 and we will increase his amlodipine  dose. Will continue to monitor.

## 2024-02-23 NOTE — Assessment & Plan Note (Signed)
 Stable. Continue Lexapro  15 mg daily and Ativan  as needed. Mirtazapine  at bedtime. PDMP reviewed, no aberrancies.

## 2024-02-23 NOTE — Progress Notes (Signed)
 "  Established Patient Office Visit  Subjective   Patient ID: Aariz Maish., male    DOB: [birth year]  Age: 58 y.o. MRN: [id number]  Chief Complaint  Patient presents with   Medical Management of Chronic Issues    HPI  Discussed the use of AI scribe software for clinical note transcription with the patient, who gave verbal consent to proceed.  History of Present Illness   Abdel R Aubery Douthat. is a 58 year old male who presents for routine follow up. He is accompanied by his mother.   Oropharyngeal pain - Pain localized to the area of the tonsils for approximately two weeks - Patient points to neck along cervical chain when asked where the pain is  - Pain intensity increased in cold weather and improved with warmth - Patient reports that the pain now is completely resolved  - Hx of recent viral illness about 2 weeks ago with runny nose and cough  - Denies difficulty swallowing, muffled voice, fevers, neck swelling, etc.  - No recent history of mouth or tooth trauma. Does not have tooth pain. Denies pain when chewing   Hypertension - Blood pressure monitored at home and reported to be normal  Seizure disorder - History of seizures - Keppra  prescribed for seizure management  Anxiety - History of anxiety - Ativan  used as needed for anxiety management          ROS Per HPI.    Objective:     BP (!) 146/76   Pulse 85   Temp 98.1 F (36.7 C) (Oral)   Ht 5' 8 (1.727 m)   Wt 151 lb 1.3 oz (68.5 kg)   SpO2 97%   BMI 22.97 kg/m    Physical Exam Constitutional:      General: He is not in acute distress.    Appearance: Normal appearance.  HENT:     Mouth/Throat:     Mouth: Mucous membranes are moist. No injury or oral lesions.     Dentition: No dental caries, dental abscesses or gum lesions.     Tongue: Tongue does not deviate from midline.     Palate: No mass and lesions.     Pharynx: Posterior oropharyngeal erythema present. No oropharyngeal exudate.      Tonsils: No tonsillar exudate or tonsillar abscesses.  Cardiovascular:     Rate and Rhythm: Normal rate and regular rhythm.     Heart sounds: Normal heart sounds. No murmur heard.    No friction rub. No gallop.  Pulmonary:     Effort: Pulmonary effort is normal. No respiratory distress.     Breath sounds: Normal breath sounds.  Musculoskeletal:        General: No swelling.     Cervical back: Neck supple. No tenderness.  Lymphadenopathy:     Cervical: No cervical adenopathy.  Skin:    General: Skin is warm and dry.  Neurological:     General: No focal deficit present.     Mental Status: He is alert.  Psychiatric:        Mood and Affect: Mood normal.        Behavior: Behavior normal.        Thought Content: Thought content normal.      No results found for any visits on 02/23/24.  Last CBC Lab Results  Component Value Date   WBC 6.5 08/18/2023   HGB 13.8 08/18/2023   HCT 44.1 08/18/2023   MCV 90 08/18/2023   MCH 28.0  08/18/2023   RDW 14.0 08/18/2023   PLT 173 08/18/2023   Last metabolic panel Lab Results  Component Value Date   GLUCOSE 73 08/18/2023   NA 144 08/18/2023   K 4.2 08/18/2023   CL 104 08/18/2023   CO2 23 08/18/2023   BUN 9 08/18/2023   CREATININE 1.05 08/18/2023   EGFR 82 08/18/2023   CALCIUM  9.5 08/18/2023   PHOS 3.3 04/04/2020   PROT 6.9 08/18/2023   ALBUMIN 4.4 08/18/2023   LABGLOB 2.5 08/18/2023   AGRATIO 1.7 06/06/2022   BILITOT 0.3 08/18/2023   ALKPHOS 92 08/18/2023   AST 24 08/18/2023   ALT 20 08/18/2023   ANIONGAP 5 01/02/2023   Last lipids Lab Results  Component Value Date   CHOL 155 08/18/2023   HDL 37 (L) 08/18/2023   LDLCALC 96 08/18/2023   TRIG 123 08/18/2023   CHOLHDL 4.2 08/18/2023   Last hemoglobin A1c Lab Results  Component Value Date   HGBA1C 5.4 08/18/2023   Last thyroid  functions Lab Results  Component Value Date   TSH 1.840 08/18/2023   T3TOTAL 107 02/22/2022   FREET4 1.68 04/25/2022   Last vitamin  D Lab Results  Component Value Date   VD25OH 56.9 08/18/2023      The 10-year ASCVD risk score (Arnett DK, et al., 2019) is: 11.4%    Assessment & Plan:   Screen for colon cancer -     Ambulatory referral to Gastroenterology  Hyperlipidemia, unspecified hyperlipidemia type Assessment & Plan: Rechecking lipid panel with labs. Continue rosuvastatin  20 mg daily   Orders: -     Rosuvastatin  Calcium ; Take 1 tablet (20 mg total) by mouth daily.  Dispense: 90 tablet; Refill: 2 -     Lipid panel; Future -     Comprehensive metabolic panel with GFR; Future  FTT (failure to thrive) in adult Assessment & Plan: Patient has continued to gain weight on mirtazpine 30 mg at bedtime and is focusing on caloric and protein intake. Continue mirtazapine  30 mg at bedtime   Orders: -     Mirtazapine ; Take 1 tablet (30 mg total) by mouth at bedtime.  Dispense: 90 tablet; Refill: 2  Generalized anxiety disorder Assessment & Plan: Stable. Continue Lexapro  15 mg daily and Ativan  as needed. Mirtazapine  at bedtime. PDMP reviewed, no aberrancies.   Orders: -     Mirtazapine ; Take 1 tablet (30 mg total) by mouth at bedtime.  Dispense: 90 tablet; Refill: 2 -     LORazepam ; Take 1 tablet (0.5 mg total) by mouth daily as needed for anxiety.  Dispense: 30 tablet; Refill: 0 -     Escitalopram  Oxalate; Take 1 tablet (10 mg total) by mouth daily.  Dispense: 90 tablet; Refill: 2  HTN, goal below 130/80 Assessment & Plan: Blood pressure goal <130/80. Blood pressure above goal in office and on recheck today. Given that patient's ambulatory blood pressure readings are at goal, will continue amlodipine  5 mg daily for blood pressure. Recommended that patient continue ambulatory blood pressure monitoring. Also advised patient to notify if blood pressure is consistently above 130/80 and we will increase his amlodipine  dose. Will continue to monitor.   Orders: -     amLODIPine  Besylate; Take 1 tablet (5 mg total) by  mouth daily.  Dispense: 90 tablet; Refill: 2 -     Comprehensive metabolic panel with GFR; Future -     CBC with Differential/Platelet; Future  Hypothyroidism, unspecified type Assessment & Plan: Rechecking TSH with labs. Follows with  Dr. Lawence. Continue levothyroxine  50 mcg daily for now.   Orders: -     Levothyroxine  Sodium; Take 1 tablet (50 mcg total) by mouth daily before breakfast.  Dispense: 90 tablet; Refill: 2 -     TSH; Future  Vitamin D  deficiency -     VITAMIN D  25 Hydroxy (Vit-D Deficiency, Fractures); Future  Other abnormal glucose -     Hemoglobin A1c; Future  Sore throat Assessment & Plan: Physical exam provides low suspicion for dental or tonsillar abscess. Likely sore throat secondary to post nasal drainage and irritation. Recommend chloraseptic throat spray, warm teas, and to continue symptomatic treatment at home. Monitor symptoms and update if no improvement in 1 week.   Seizure disorder [facility]) Assessment & Plan: Followed by neurology annually in November. Continue Keppra  1000 mg twice daily.    Localization-related epilepsy [facility]) Assessment & Plan: Followed by neurology annually in November. Continue Keppra  1000 mg twice daily.      Return in about 6 months (around 08/23/2024) for Med check.    Saddie JULIANNA Sacks, PA-C "

## 2024-02-23 NOTE — Patient Instructions (Signed)
 VISIT SUMMARY: Today, you were seen for tooth or tonsil pain that has been bothering you for about two weeks. We also discussed your upper respiratory symptoms, anxiety, hypertension, hyperlipidemia, and hypothyroidism. Your medications were refilled, and we talked about some upcoming vaccines and a colonoscopy.  YOUR PLAN: ACUTE UPPER RESPIRATORY INFECTION: You have symptoms of a viral infection, which is less likely to be strep throat. -Continue using Robitussin and chloraseptic spray. -Drink warm teas and soups to help with the symptoms. -Monitor your symptoms and let us  know if there is no improvement in 1-2 weeks or if your symptoms worsen.  GENERALIZED ANXIETY DISORDER: Your anxiety is stable with your current medications. -Your prescriptions for escitalopram  and lorazepam  have been refilled with a 90-day supply.  ESSENTIAL HYPERTENSION: Your blood pressure was elevated in the office, but your home readings are normal. -We rechecked your blood pressure before you left the office. -Continue taking amlodipine . Your prescription has been refilled with a 90-day supply.  HYPERLIPIDEMIA: Your cholesterol levels are well-controlled with your current medication. -Your prescription for rosuvastatin  has been refilled with a 90-day supply.  HYPOTHYROIDISM: Your thyroid  function is stable with your current medication. -Continue taking levothyroxine . Your prescription has been refilled with a 90-day supply.  GENERAL HEALTH MAINTENANCE: You are due for some vaccines and a colonoscopy. -Get your COVID and shingles vaccines. -A colonoscopy has been ordered and scheduled with the endoscopy office.  If you have any problems before your next visit feel free to message me via MyChart (minor issues or questions) or call the office, otherwise you may reach out to schedule an office visit.  Thank you! Saddie Sacks, PA-C

## 2024-02-23 NOTE — Assessment & Plan Note (Signed)
Followed by neurology annually in November.  Continue Keppra 1000 mg twice daily.

## 2024-02-23 NOTE — Assessment & Plan Note (Signed)
 Rechecking lipid panel with labs. Continue rosuvastatin  20 mg daily

## 2024-02-23 NOTE — Assessment & Plan Note (Signed)
 Rechecking TSH with labs. Follows with Dr. Lawence. Continue levothyroxine  50 mcg daily for now.

## 2024-02-23 NOTE — Assessment & Plan Note (Signed)
 Patient has continued to gain weight on mirtazpine 30 mg at bedtime and is focusing on caloric and protein intake. Continue mirtazapine  30 mg at bedtime

## 2024-02-23 NOTE — Assessment & Plan Note (Signed)
 Physical exam provides low suspicion for dental or tonsillar abscess. Likely sore throat secondary to post nasal drainage and irritation. Recommend chloraseptic throat spray, warm teas, and to continue symptomatic treatment at home. Monitor symptoms and update if no improvement in 1 week.

## 2024-02-24 ENCOUNTER — Ambulatory Visit

## 2024-03-05 ENCOUNTER — Other Ambulatory Visit

## 2024-03-05 DIAGNOSIS — I10 Essential (primary) hypertension: Secondary | ICD-10-CM

## 2024-03-05 DIAGNOSIS — R7309 Other abnormal glucose: Secondary | ICD-10-CM

## 2024-03-05 DIAGNOSIS — E039 Hypothyroidism, unspecified: Secondary | ICD-10-CM

## 2024-03-05 DIAGNOSIS — E559 Vitamin D deficiency, unspecified: Secondary | ICD-10-CM

## 2024-03-05 DIAGNOSIS — E785 Hyperlipidemia, unspecified: Secondary | ICD-10-CM

## 2024-03-06 LAB — COMPREHENSIVE METABOLIC PANEL WITH GFR
ALT: 15 IU/L (ref 0–44)
AST: 25 IU/L (ref 0–40)
Albumin: 4.1 g/dL (ref 3.8–4.9)
Alkaline Phosphatase: 95 IU/L (ref 47–123)
BUN/Creatinine Ratio: 7 — ABNORMAL LOW (ref 9–20)
BUN: 9 mg/dL (ref 6–24)
Bilirubin Total: 0.3 mg/dL (ref 0.0–1.2)
CO2: 26 mmol/L (ref 20–29)
Calcium: 9.2 mg/dL (ref 8.7–10.2)
Chloride: 101 mmol/L (ref 96–106)
Creatinine, Ser: 1.23 mg/dL (ref 0.76–1.27)
Globulin, Total: 2.6 g/dL (ref 1.5–4.5)
Glucose: 103 mg/dL — ABNORMAL HIGH (ref 70–99)
Potassium: 4 mmol/L (ref 3.5–5.2)
Sodium: 140 mmol/L (ref 134–144)
Total Protein: 6.7 g/dL (ref 6.0–8.5)
eGFR: 68 mL/min/1.73

## 2024-03-06 LAB — LIPID PANEL
Chol/HDL Ratio: 4.1 ratio (ref 0.0–5.0)
Cholesterol, Total: 141 mg/dL (ref 100–199)
HDL: 34 mg/dL — ABNORMAL LOW
LDL Chol Calc (NIH): 83 mg/dL (ref 0–99)
Triglycerides: 137 mg/dL (ref 0–149)
VLDL Cholesterol Cal: 24 mg/dL (ref 5–40)

## 2024-03-06 LAB — CBC WITH DIFFERENTIAL/PLATELET
Basophils Absolute: 0.1 x10E3/uL (ref 0.0–0.2)
Basos: 1 %
EOS (ABSOLUTE): 0.2 x10E3/uL (ref 0.0–0.4)
Eos: 3 %
Hematocrit: 45.8 % (ref 37.5–51.0)
Hemoglobin: 14.6 g/dL (ref 13.0–17.7)
Immature Grans (Abs): 0 x10E3/uL (ref 0.0–0.1)
Immature Granulocytes: 0 %
Lymphocytes Absolute: 1.2 x10E3/uL (ref 0.7–3.1)
Lymphs: 15 %
MCH: 28.2 pg (ref 26.6–33.0)
MCHC: 31.9 g/dL (ref 31.5–35.7)
MCV: 89 fL (ref 79–97)
Monocytes Absolute: 0.5 x10E3/uL (ref 0.1–0.9)
Monocytes: 7 %
Neutrophils Absolute: 5.8 x10E3/uL (ref 1.4–7.0)
Neutrophils: 74 %
Platelets: 215 x10E3/uL (ref 150–450)
RBC: 5.17 x10E6/uL (ref 4.14–5.80)
RDW: 13.3 % (ref 11.6–15.4)
WBC: 7.8 x10E3/uL (ref 3.4–10.8)

## 2024-03-06 LAB — VITAMIN D 25 HYDROXY (VIT D DEFICIENCY, FRACTURES): Vit D, 25-Hydroxy: 41.5 ng/mL (ref 30.0–100.0)

## 2024-03-06 LAB — TSH: TSH: 2.74 u[IU]/mL (ref 0.450–4.500)

## 2024-03-06 LAB — HEMOGLOBIN A1C
Est. average glucose Bld gHb Est-mCnc: 111 mg/dL
Hgb A1c MFr Bld: 5.5 % (ref 4.8–5.6)

## 2024-03-07 ENCOUNTER — Ambulatory Visit: Payer: Self-pay
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
## 2024-03-10 ENCOUNTER — Ambulatory Visit: Admitting: Neurology
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
## 2024-03-22 ENCOUNTER — Ambulatory Visit: Payer: Self-pay | Admitting: Neurology
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
## 2024-03-25 ENCOUNTER — Encounter: Payer: Self-pay | Admitting: Neurology

## 2024-03-25 ENCOUNTER — Ambulatory Visit: Admitting: Neurology

## 2024-03-25 VITALS — BP 127/66 | HR 97 | Ht 69.0 in | Wt 151.2 lb

## 2024-03-25 DIAGNOSIS — S069X9D Unspecified intracranial injury with loss of consciousness of unspecified duration, subsequent encounter: Secondary | ICD-10-CM

## 2024-03-25 DIAGNOSIS — G40109 Localization-related (focal) (partial) symptomatic epilepsy and epileptic syndromes with simple partial seizures, not intractable, without status epilepticus: Secondary | ICD-10-CM

## 2024-03-25 DIAGNOSIS — F444 Conversion disorder with motor symptom or deficit: Secondary | ICD-10-CM

## 2024-03-25 MED ORDER — LEVETIRACETAM 1000 MG PO TABS: 1000.0000 mg | ORAL_TABLET | Freq: Two times a day (BID) | ORAL | 3 refills | Status: AC

## 2024-04-09 ENCOUNTER — Other Ambulatory Visit: Payer: Self-pay

## 2024-04-09 DIAGNOSIS — E039 Hypothyroidism, unspecified: Secondary | ICD-10-CM

## 2024-08-23 ENCOUNTER — Encounter

## 2025-03-28 ENCOUNTER — Ambulatory Visit: Payer: Self-pay | Admitting: Neurology
